# Patient Record
Sex: Female | Born: 1940 | Race: Black or African American | Hispanic: No | Smoking: Former smoker
Health system: Southern US, Community
[De-identification: ages and names within clinical notes are randomized; demographics above are authoritative.]

## PROBLEM LIST (undated history)

## (undated) ENCOUNTER — Ambulatory Visit: Discharge status: Absent without leave | Payer: Medicare HMO

## (undated) DIAGNOSIS — E876 Hypokalemia: Secondary | ICD-10-CM

## (undated) DIAGNOSIS — E119 Type 2 diabetes mellitus without complications: Secondary | ICD-10-CM

## (undated) DIAGNOSIS — N183 Chronic kidney disease, stage 3 unspecified: Secondary | ICD-10-CM

## (undated) DIAGNOSIS — I10 Essential (primary) hypertension: Secondary | ICD-10-CM

## (undated) DIAGNOSIS — E785 Hyperlipidemia, unspecified: Secondary | ICD-10-CM

## (undated) DIAGNOSIS — I5022 Chronic systolic (congestive) heart failure: Secondary | ICD-10-CM

## (undated) DIAGNOSIS — F0390 Unspecified dementia without behavioral disturbance: Secondary | ICD-10-CM

## (undated) DIAGNOSIS — I119 Hypertensive heart disease without heart failure: Secondary | ICD-10-CM

## (undated) DIAGNOSIS — I43 Cardiomyopathy in diseases classified elsewhere: Secondary | ICD-10-CM

## (undated) HISTORY — DX: Essential (primary) hypertension: I10

## (undated) HISTORY — DX: Hypertensive heart disease without heart failure: I11.9

## (undated) HISTORY — PX: CHOLECYSTECTOMY: SHX55

## (undated) HISTORY — PX: TONSILLECTOMY: SUR1361

## (undated) HISTORY — DX: Chronic kidney disease, stage 3 unspecified: N18.30

## (undated) HISTORY — DX: Chronic kidney disease, stage 3 (moderate): N18.3

## (undated) HISTORY — DX: Hypertensive heart disease without heart failure: I43

## (undated) HISTORY — DX: Hyperlipidemia, unspecified: E78.5

## (undated) HISTORY — DX: Type 2 diabetes mellitus without complications: E11.9

## (undated) HISTORY — PX: ABDOMINAL HYSTERECTOMY: SHX81

## (undated) HISTORY — DX: Chronic systolic (congestive) heart failure: I50.22

## (undated) HISTORY — DX: Hypokalemia: E87.6

---

## 2004-11-07 ENCOUNTER — Emergency Department: Payer: Self-pay | Admitting: Emergency Medicine

## 2005-01-18 ENCOUNTER — Other Ambulatory Visit: Payer: Self-pay

## 2005-01-18 ENCOUNTER — Emergency Department: Payer: Self-pay | Admitting: Emergency Medicine

## 2005-03-21 ENCOUNTER — Ambulatory Visit: Payer: Self-pay | Admitting: Family Medicine

## 2005-04-22 ENCOUNTER — Ambulatory Visit: Payer: Self-pay | Admitting: Gastroenterology

## 2005-07-02 ENCOUNTER — Ambulatory Visit: Payer: Self-pay | Admitting: Gastroenterology

## 2006-02-04 ENCOUNTER — Ambulatory Visit: Payer: Self-pay | Admitting: Family Medicine

## 2007-01-19 ENCOUNTER — Ambulatory Visit: Payer: Self-pay | Admitting: Family Medicine

## 2007-06-16 ENCOUNTER — Ambulatory Visit: Payer: Self-pay | Admitting: Family Medicine

## 2008-02-11 ENCOUNTER — Ambulatory Visit: Payer: Self-pay | Admitting: Family Medicine

## 2008-07-29 LAB — HM COLONOSCOPY: HM Colonoscopy: NORMAL

## 2008-11-04 ENCOUNTER — Ambulatory Visit: Payer: Self-pay | Admitting: Family Medicine

## 2008-11-18 ENCOUNTER — Ambulatory Visit: Payer: Self-pay | Admitting: Gastroenterology

## 2009-02-23 ENCOUNTER — Ambulatory Visit: Payer: Self-pay | Admitting: Family Medicine

## 2009-07-17 ENCOUNTER — Inpatient Hospital Stay: Payer: Self-pay | Admitting: Internal Medicine

## 2010-01-10 ENCOUNTER — Ambulatory Visit: Payer: Self-pay | Admitting: Family Medicine

## 2010-01-30 ENCOUNTER — Ambulatory Visit: Payer: Self-pay | Admitting: General Surgery

## 2010-02-06 ENCOUNTER — Ambulatory Visit: Payer: Self-pay | Admitting: General Surgery

## 2010-02-08 ENCOUNTER — Ambulatory Visit: Payer: Self-pay | Admitting: General Surgery

## 2010-02-09 LAB — PATHOLOGY REPORT

## 2010-06-25 ENCOUNTER — Ambulatory Visit: Payer: Self-pay | Admitting: Family Medicine

## 2010-07-27 ENCOUNTER — Inpatient Hospital Stay: Payer: Self-pay | Admitting: Internal Medicine

## 2010-09-17 ENCOUNTER — Ambulatory Visit: Payer: Self-pay | Admitting: Gastroenterology

## 2010-11-02 ENCOUNTER — Emergency Department: Payer: Self-pay | Admitting: Emergency Medicine

## 2011-03-28 DIAGNOSIS — E118 Type 2 diabetes mellitus with unspecified complications: Secondary | ICD-10-CM | POA: Insufficient documentation

## 2011-03-28 DIAGNOSIS — Z8719 Personal history of other diseases of the digestive system: Secondary | ICD-10-CM | POA: Insufficient documentation

## 2011-03-28 DIAGNOSIS — E119 Type 2 diabetes mellitus without complications: Secondary | ICD-10-CM | POA: Insufficient documentation

## 2012-05-22 ENCOUNTER — Ambulatory Visit: Payer: Self-pay | Admitting: Internal Medicine

## 2012-06-02 ENCOUNTER — Emergency Department: Payer: Self-pay | Admitting: Emergency Medicine

## 2012-06-02 LAB — CBC
HGB: 13.1 g/dL (ref 12.0–16.0)
MCH: 29.5 pg (ref 26.0–34.0)
MCV: 87 fL (ref 80–100)
RBC: 4.43 10*6/uL (ref 3.80–5.20)
RDW: 13.8 % (ref 11.5–14.5)

## 2012-06-02 LAB — URINALYSIS, COMPLETE
Bilirubin,UR: NEGATIVE
Ketone: NEGATIVE
Ph: 6 (ref 4.5–8.0)
Protein: 100
RBC,UR: 2 /HPF (ref 0–5)
Specific Gravity: 1.004 (ref 1.003–1.030)
Squamous Epithelial: 3
WBC UR: 2 /HPF (ref 0–5)

## 2012-06-02 LAB — COMPREHENSIVE METABOLIC PANEL
Albumin: 3.5 g/dL (ref 3.4–5.0)
Alkaline Phosphatase: 97 U/L (ref 50–136)
Anion Gap: 7 (ref 7–16)
BUN: 17 mg/dL (ref 7–18)
Bilirubin,Total: 0.4 mg/dL (ref 0.2–1.0)
Calcium, Total: 9 mg/dL (ref 8.5–10.1)
Creatinine: 1.03 mg/dL (ref 0.60–1.30)
Glucose: 64 mg/dL — ABNORMAL LOW (ref 65–99)
Potassium: 2.7 mmol/L — ABNORMAL LOW (ref 3.5–5.1)
SGOT(AST): 29 U/L (ref 15–37)
SGPT (ALT): 22 U/L (ref 12–78)
Total Protein: 7.7 g/dL (ref 6.4–8.2)

## 2012-06-02 LAB — CK TOTAL AND CKMB (NOT AT ARMC): CK-MB: 0.7 ng/mL (ref 0.5–3.6)

## 2012-06-02 LAB — MAGNESIUM: Magnesium: 1.6 mg/dL — ABNORMAL LOW

## 2012-12-05 ENCOUNTER — Emergency Department: Payer: Self-pay | Admitting: Emergency Medicine

## 2012-12-07 ENCOUNTER — Emergency Department: Payer: Self-pay | Admitting: Emergency Medicine

## 2013-08-17 ENCOUNTER — Ambulatory Visit: Payer: Self-pay | Admitting: Internal Medicine

## 2013-08-17 LAB — HM MAMMOGRAPHY: HM MAMMO: NORMAL

## 2014-08-17 DIAGNOSIS — E1165 Type 2 diabetes mellitus with hyperglycemia: Secondary | ICD-10-CM | POA: Diagnosis not present

## 2014-08-17 DIAGNOSIS — Z09 Encounter for follow-up examination after completed treatment for conditions other than malignant neoplasm: Secondary | ICD-10-CM | POA: Diagnosis not present

## 2014-08-17 DIAGNOSIS — Z23 Encounter for immunization: Secondary | ICD-10-CM | POA: Diagnosis not present

## 2014-08-17 DIAGNOSIS — N182 Chronic kidney disease, stage 2 (mild): Secondary | ICD-10-CM | POA: Diagnosis not present

## 2014-08-17 DIAGNOSIS — K274 Chronic or unspecified peptic ulcer, site unspecified, with hemorrhage: Secondary | ICD-10-CM | POA: Diagnosis not present

## 2014-08-17 DIAGNOSIS — E1129 Type 2 diabetes mellitus with other diabetic kidney complication: Secondary | ICD-10-CM | POA: Diagnosis not present

## 2014-08-17 DIAGNOSIS — E784 Other hyperlipidemia: Secondary | ICD-10-CM | POA: Diagnosis not present

## 2014-08-17 DIAGNOSIS — I10 Essential (primary) hypertension: Secondary | ICD-10-CM | POA: Diagnosis not present

## 2014-08-17 LAB — HEMOGLOBIN A1C: HEMOGLOBIN A1C: 6.5 % — AB (ref 4.0–6.0)

## 2014-10-24 DIAGNOSIS — R05 Cough: Secondary | ICD-10-CM | POA: Diagnosis not present

## 2014-10-24 DIAGNOSIS — R0602 Shortness of breath: Secondary | ICD-10-CM | POA: Diagnosis not present

## 2014-10-24 DIAGNOSIS — J9811 Atelectasis: Secondary | ICD-10-CM | POA: Diagnosis not present

## 2014-10-24 DIAGNOSIS — R062 Wheezing: Secondary | ICD-10-CM | POA: Diagnosis not present

## 2014-10-24 LAB — COMPREHENSIVE METABOLIC PANEL
AST: 36 U/L
Albumin: 3.3 g/dL — ABNORMAL LOW
Alkaline Phosphatase: 76 U/L
Anion Gap: 8 (ref 7–16)
BUN: 19 mg/dL
Bilirubin,Total: 0.6 mg/dL
CALCIUM: 8.5 mg/dL — AB
Chloride: 106 mmol/L
Co2: 25 mmol/L
Creatinine: 1.32 mg/dL — ABNORMAL HIGH
EGFR (African American): 47 — ABNORMAL LOW
EGFR (Non-African Amer.): 40 — ABNORMAL LOW
GLUCOSE: 238 mg/dL — AB
Potassium: 3.6 mmol/L
SGPT (ALT): 17 U/L
SODIUM: 139 mmol/L
Total Protein: 6.6 g/dL

## 2014-10-24 LAB — CBC WITH DIFFERENTIAL/PLATELET
Basophil #: 0 10*3/uL (ref 0.0–0.1)
Basophil %: 0.4 %
EOS PCT: 2.3 %
Eosinophil #: 0.1 10*3/uL (ref 0.0–0.7)
HCT: 39.9 % (ref 35.0–47.0)
HGB: 12.7 g/dL (ref 12.0–16.0)
LYMPHS PCT: 38.7 %
Lymphocyte #: 2.3 10*3/uL (ref 1.0–3.6)
MCH: 29.3 pg (ref 26.0–34.0)
MCHC: 31.8 g/dL — ABNORMAL LOW (ref 32.0–36.0)
MCV: 92 fL (ref 80–100)
Monocyte #: 0.4 x10 3/mm (ref 0.2–0.9)
Monocyte %: 7.3 %
NEUTROS ABS: 3 10*3/uL (ref 1.4–6.5)
NEUTROS PCT: 51.3 %
Platelet: 180 10*3/uL (ref 150–440)
RBC: 4.33 10*6/uL (ref 3.80–5.20)
RDW: 14.1 % (ref 11.5–14.5)
WBC: 5.9 10*3/uL (ref 3.6–11.0)

## 2014-10-24 LAB — CK TOTAL AND CKMB (NOT AT ARMC)
CK, TOTAL: 205 U/L
CK-MB: 2.8 ng/mL

## 2014-10-24 LAB — PROTIME-INR
INR: 1
Prothrombin Time: 13.1 secs

## 2014-10-24 LAB — TROPONIN I: Troponin-I: 0.04 ng/mL — ABNORMAL HIGH

## 2014-10-24 LAB — PRO B NATRIURETIC PEPTIDE: B-Type Natriuretic Peptide: 546 pg/mL — ABNORMAL HIGH

## 2014-10-25 ENCOUNTER — Observation Stay: Payer: Self-pay | Admitting: Internal Medicine

## 2014-10-25 ENCOUNTER — Other Ambulatory Visit: Payer: Self-pay | Admitting: Physician Assistant

## 2014-10-25 DIAGNOSIS — E119 Type 2 diabetes mellitus without complications: Secondary | ICD-10-CM | POA: Diagnosis not present

## 2014-10-25 DIAGNOSIS — R7989 Other specified abnormal findings of blood chemistry: Secondary | ICD-10-CM | POA: Diagnosis not present

## 2014-10-25 DIAGNOSIS — N179 Acute kidney failure, unspecified: Secondary | ICD-10-CM | POA: Diagnosis not present

## 2014-10-25 DIAGNOSIS — R0602 Shortness of breath: Secondary | ICD-10-CM | POA: Diagnosis not present

## 2014-10-25 DIAGNOSIS — I10 Essential (primary) hypertension: Secondary | ICD-10-CM | POA: Diagnosis not present

## 2014-10-25 DIAGNOSIS — I2510 Atherosclerotic heart disease of native coronary artery without angina pectoris: Secondary | ICD-10-CM | POA: Diagnosis not present

## 2014-10-25 LAB — URINALYSIS, COMPLETE
BACTERIA: NONE SEEN
Bilirubin,UR: NEGATIVE
Ketone: NEGATIVE
Leukocyte Esterase: NEGATIVE
NITRITE: NEGATIVE
PH: 5 (ref 4.5–8.0)
Protein: >=500
RBC,UR: 7 /HPF (ref 0–5)
SPECIFIC GRAVITY: 1.01 (ref 1.003–1.030)
Squamous Epithelial: 5
WBC UR: 2 /HPF (ref 0–5)

## 2014-10-25 LAB — CREATININE, SERUM
Creatinine: 1.34 mg/dL — ABNORMAL HIGH
GFR CALC AF AMER: 46 — AB
GFR CALC NON AF AMER: 40 — AB

## 2014-10-25 LAB — TROPONIN I
TROPONIN-I: 0.51 ng/mL — AB
Troponin-I: 0.46 ng/mL — ABNORMAL HIGH
Troponin-I: 0.51 ng/mL — ABNORMAL HIGH

## 2014-10-25 LAB — CK-MB
CK-MB: 5.6 ng/mL — ABNORMAL HIGH
CK-MB: 5.9 ng/mL — ABNORMAL HIGH
CK-MB: 5.8 ng/mL — ABNORMAL HIGH

## 2014-10-26 ENCOUNTER — Telehealth: Payer: Self-pay

## 2014-10-26 NOTE — Telephone Encounter (Signed)
Patient contacted regarding discharge from [facility] on 10/25/14.  Patient understands to follow up with Dr. Rockey Situ on 11/11/14 at 3:00 at [facility]. Patient understands discharge instructions? yes Patient understands medications and regiment? yes Patient understands to bring all medications to this visit? yes

## 2014-10-27 ENCOUNTER — Inpatient Hospital Stay
Admit: 2014-10-27 | Disposition: A | Discharge status: Home / Self Care | Payer: Self-pay | Attending: Internal Medicine | Admitting: Internal Medicine

## 2014-10-27 DIAGNOSIS — F0280 Dementia in other diseases classified elsewhere without behavioral disturbance: Secondary | ICD-10-CM | POA: Diagnosis not present

## 2014-10-27 DIAGNOSIS — E785 Hyperlipidemia, unspecified: Secondary | ICD-10-CM | POA: Diagnosis not present

## 2014-10-27 DIAGNOSIS — R7989 Other specified abnormal findings of blood chemistry: Secondary | ICD-10-CM | POA: Diagnosis not present

## 2014-10-27 DIAGNOSIS — I214 Non-ST elevation (NSTEMI) myocardial infarction: Secondary | ICD-10-CM | POA: Diagnosis not present

## 2014-10-27 DIAGNOSIS — I674 Hypertensive encephalopathy: Secondary | ICD-10-CM | POA: Diagnosis not present

## 2014-10-27 DIAGNOSIS — I517 Cardiomegaly: Secondary | ICD-10-CM | POA: Diagnosis not present

## 2014-10-27 DIAGNOSIS — E119 Type 2 diabetes mellitus without complications: Secondary | ICD-10-CM | POA: Diagnosis not present

## 2014-10-27 DIAGNOSIS — I10 Essential (primary) hypertension: Secondary | ICD-10-CM | POA: Diagnosis not present

## 2014-10-27 DIAGNOSIS — I119 Hypertensive heart disease without heart failure: Secondary | ICD-10-CM | POA: Diagnosis not present

## 2014-10-27 DIAGNOSIS — G309 Alzheimer's disease, unspecified: Secondary | ICD-10-CM | POA: Diagnosis not present

## 2014-10-27 DIAGNOSIS — R51 Headache: Secondary | ICD-10-CM | POA: Diagnosis not present

## 2014-10-27 DIAGNOSIS — I248 Other forms of acute ischemic heart disease: Secondary | ICD-10-CM | POA: Diagnosis not present

## 2014-10-27 DIAGNOSIS — R030 Elevated blood-pressure reading, without diagnosis of hypertension: Secondary | ICD-10-CM | POA: Diagnosis not present

## 2014-10-27 DIAGNOSIS — R079 Chest pain, unspecified: Secondary | ICD-10-CM | POA: Diagnosis not present

## 2014-10-27 DIAGNOSIS — R0789 Other chest pain: Secondary | ICD-10-CM | POA: Diagnosis not present

## 2014-10-27 DIAGNOSIS — R42 Dizziness and giddiness: Secondary | ICD-10-CM | POA: Diagnosis not present

## 2014-10-27 DIAGNOSIS — E118 Type 2 diabetes mellitus with unspecified complications: Secondary | ICD-10-CM | POA: Diagnosis not present

## 2014-10-27 DIAGNOSIS — I43 Cardiomyopathy in diseases classified elsewhere: Secondary | ICD-10-CM | POA: Diagnosis not present

## 2014-10-27 DIAGNOSIS — I1310 Hypertensive heart and chronic kidney disease without heart failure, with stage 1 through stage 4 chronic kidney disease, or unspecified chronic kidney disease: Secondary | ICD-10-CM | POA: Diagnosis not present

## 2014-10-27 DIAGNOSIS — I350 Nonrheumatic aortic (valve) stenosis: Secondary | ICD-10-CM | POA: Diagnosis not present

## 2014-10-27 DIAGNOSIS — G308 Other Alzheimer's disease: Secondary | ICD-10-CM | POA: Diagnosis not present

## 2014-10-27 DIAGNOSIS — R531 Weakness: Secondary | ICD-10-CM | POA: Diagnosis not present

## 2014-10-27 DIAGNOSIS — N183 Chronic kidney disease, stage 3 (moderate): Secondary | ICD-10-CM | POA: Diagnosis not present

## 2014-10-27 LAB — DRUG SCREEN, URINE
AMPHETAMINES, UR SCREEN: NEGATIVE
BENZODIAZEPINE, UR SCRN: NEGATIVE
Barbiturates, Ur Screen: NEGATIVE
CANNABINOID 50 NG, UR ~~LOC~~: NEGATIVE
Cocaine Metabolite,Ur ~~LOC~~: NEGATIVE
MDMA (Ecstasy)Ur Screen: NEGATIVE
METHADONE, UR SCREEN: NEGATIVE
OPIATE, UR SCREEN: NEGATIVE
PHENCYCLIDINE (PCP) UR S: NEGATIVE
TRICYCLIC, UR SCREEN: NEGATIVE

## 2014-10-27 LAB — COMPREHENSIVE METABOLIC PANEL
ALBUMIN: 3.1 g/dL — AB
ANION GAP: 5 — AB (ref 7–16)
AST: 30 U/L
Alkaline Phosphatase: 69 U/L
BUN: 15 mg/dL
Bilirubin,Total: 0.7 mg/dL
CHLORIDE: 104 mmol/L
CREATININE: 1.22 mg/dL — AB
Calcium, Total: 8.6 mg/dL — ABNORMAL LOW
Co2: 29 mmol/L
EGFR (Non-African Amer.): 45 — ABNORMAL LOW
GFR CALC AF AMER: 52 — AB
Glucose: 117 mg/dL — ABNORMAL HIGH
Potassium: 3.7 mmol/L
SGPT (ALT): 19 U/L
SODIUM: 138 mmol/L
Total Protein: 6.4 g/dL — ABNORMAL LOW

## 2014-10-27 LAB — URINALYSIS, COMPLETE
Bacteria: NONE SEEN
Bilirubin,UR: NEGATIVE
Glucose,UR: NEGATIVE mg/dL (ref 0–75)
Ketone: NEGATIVE
Leukocyte Esterase: NEGATIVE
Nitrite: NEGATIVE
PH: 7 (ref 4.5–8.0)
RBC,UR: <1 /HPF (ref 0–5)
SPECIFIC GRAVITY: 1.008 (ref 1.003–1.030)
Squamous Epithelial: 2

## 2014-10-27 LAB — CK TOTAL AND CKMB (NOT AT ARMC)
CK, TOTAL: 105 U/L
CK-MB: 2 ng/mL

## 2014-10-27 LAB — PROTIME-INR
INR: 1
PROTHROMBIN TIME: 13.2 s

## 2014-10-27 LAB — TROPONIN I
TROPONIN-I: 0.14 ng/mL — AB
Troponin-I: 0.18 ng/mL — ABNORMAL HIGH

## 2014-10-27 LAB — APTT: ACTIVATED PTT: 27.7 s (ref 23.6–35.9)

## 2014-10-27 LAB — CBC
HCT: 37.9 % (ref 35.0–47.0)
HGB: 12.2 g/dL (ref 12.0–16.0)
MCH: 29.7 pg (ref 26.0–34.0)
MCHC: 32.3 g/dL (ref 32.0–36.0)
MCV: 92 fL (ref 80–100)
Platelet: 182 10*3/uL (ref 150–440)
RBC: 4.12 10*6/uL (ref 3.80–5.20)
RDW: 14.1 % (ref 11.5–14.5)
WBC: 6.4 10*3/uL (ref 3.6–11.0)

## 2014-10-27 LAB — CK-MB: CK-MB: 1.8 ng/mL

## 2014-10-27 LAB — TSH: Thyroid Stimulating Horm: 5.591 u[IU]/mL — ABNORMAL HIGH

## 2014-10-28 DIAGNOSIS — I350 Nonrheumatic aortic (valve) stenosis: Secondary | ICD-10-CM | POA: Diagnosis not present

## 2014-10-28 DIAGNOSIS — R079 Chest pain, unspecified: Secondary | ICD-10-CM | POA: Diagnosis not present

## 2014-10-28 DIAGNOSIS — R7989 Other specified abnormal findings of blood chemistry: Secondary | ICD-10-CM | POA: Diagnosis not present

## 2014-10-28 LAB — CBC WITH DIFFERENTIAL/PLATELET
Basophil #: 0 10*3/uL (ref 0.0–0.1)
Basophil %: 0.5 %
EOS PCT: 1 %
Eosinophil #: 0.1 10*3/uL (ref 0.0–0.7)
HCT: 35.7 % (ref 35.0–47.0)
HGB: 11.6 g/dL — AB (ref 12.0–16.0)
LYMPHS PCT: 26.2 %
Lymphocyte #: 1.7 10*3/uL (ref 1.0–3.6)
MCH: 29.6 pg (ref 26.0–34.0)
MCHC: 32.4 g/dL (ref 32.0–36.0)
MCV: 91 fL (ref 80–100)
MONOS PCT: 7.1 %
Monocyte #: 0.5 x10 3/mm (ref 0.2–0.9)
NEUTROS PCT: 65.2 %
Neutrophil #: 4.3 10*3/uL (ref 1.4–6.5)
PLATELETS: 163 10*3/uL (ref 150–440)
RBC: 3.92 10*6/uL (ref 3.80–5.20)
RDW: 14.3 % (ref 11.5–14.5)
WBC: 6.7 10*3/uL (ref 3.6–11.0)

## 2014-10-28 LAB — BASIC METABOLIC PANEL
ANION GAP: 6 — AB (ref 7–16)
BUN: 15 mg/dL
Calcium, Total: 8.3 mg/dL — ABNORMAL LOW
Chloride: 106 mmol/L
Co2: 26 mmol/L
Creatinine: 1.12 mg/dL — ABNORMAL HIGH
EGFR (Non-African Amer.): 49 — ABNORMAL LOW
GFR CALC AF AMER: 57 — AB
GLUCOSE: 212 mg/dL — AB
Potassium: 3.7 mmol/L
SODIUM: 138 mmol/L

## 2014-10-28 LAB — HEMOGLOBIN A1C: Hemoglobin A1C: 6.3 % — ABNORMAL HIGH

## 2014-10-28 LAB — LIPID PANEL
Cholesterol: 211 mg/dL — ABNORMAL HIGH
HDL Cholesterol: 46 mg/dL
LDL CHOLESTEROL, CALC: 139 mg/dL — AB
Triglycerides: 128 mg/dL
VLDL CHOLESTEROL, CALC: 26 mg/dL

## 2014-10-28 LAB — TROPONIN I: Troponin-I: 0.13 ng/mL — ABNORMAL HIGH

## 2014-10-28 LAB — CK-MB: CK-MB: 1.8 ng/mL

## 2014-11-02 DIAGNOSIS — N183 Chronic kidney disease, stage 3 (moderate): Secondary | ICD-10-CM | POA: Diagnosis not present

## 2014-11-02 DIAGNOSIS — G309 Alzheimer's disease, unspecified: Secondary | ICD-10-CM | POA: Diagnosis not present

## 2014-11-02 DIAGNOSIS — Z794 Long term (current) use of insulin: Secondary | ICD-10-CM | POA: Diagnosis not present

## 2014-11-02 DIAGNOSIS — F0280 Dementia in other diseases classified elsewhere without behavioral disturbance: Secondary | ICD-10-CM | POA: Diagnosis not present

## 2014-11-02 DIAGNOSIS — Z7982 Long term (current) use of aspirin: Secondary | ICD-10-CM | POA: Diagnosis not present

## 2014-11-02 DIAGNOSIS — G43909 Migraine, unspecified, not intractable, without status migrainosus: Secondary | ICD-10-CM | POA: Diagnosis not present

## 2014-11-02 DIAGNOSIS — E119 Type 2 diabetes mellitus without complications: Secondary | ICD-10-CM | POA: Diagnosis not present

## 2014-11-02 DIAGNOSIS — I129 Hypertensive chronic kidney disease with stage 1 through stage 4 chronic kidney disease, or unspecified chronic kidney disease: Secondary | ICD-10-CM | POA: Diagnosis not present

## 2014-11-02 DIAGNOSIS — I2510 Atherosclerotic heart disease of native coronary artery without angina pectoris: Secondary | ICD-10-CM | POA: Diagnosis not present

## 2014-11-03 DIAGNOSIS — I129 Hypertensive chronic kidney disease with stage 1 through stage 4 chronic kidney disease, or unspecified chronic kidney disease: Secondary | ICD-10-CM | POA: Diagnosis not present

## 2014-11-03 DIAGNOSIS — N183 Chronic kidney disease, stage 3 (moderate): Secondary | ICD-10-CM | POA: Diagnosis not present

## 2014-11-03 DIAGNOSIS — Z794 Long term (current) use of insulin: Secondary | ICD-10-CM | POA: Diagnosis not present

## 2014-11-03 DIAGNOSIS — Z7982 Long term (current) use of aspirin: Secondary | ICD-10-CM | POA: Diagnosis not present

## 2014-11-03 DIAGNOSIS — I2510 Atherosclerotic heart disease of native coronary artery without angina pectoris: Secondary | ICD-10-CM | POA: Diagnosis not present

## 2014-11-03 DIAGNOSIS — F0280 Dementia in other diseases classified elsewhere without behavioral disturbance: Secondary | ICD-10-CM | POA: Diagnosis not present

## 2014-11-03 DIAGNOSIS — G309 Alzheimer's disease, unspecified: Secondary | ICD-10-CM | POA: Diagnosis not present

## 2014-11-03 DIAGNOSIS — G43909 Migraine, unspecified, not intractable, without status migrainosus: Secondary | ICD-10-CM | POA: Diagnosis not present

## 2014-11-03 DIAGNOSIS — E119 Type 2 diabetes mellitus without complications: Secondary | ICD-10-CM | POA: Diagnosis not present

## 2014-11-04 DIAGNOSIS — E119 Type 2 diabetes mellitus without complications: Secondary | ICD-10-CM | POA: Diagnosis not present

## 2014-11-04 DIAGNOSIS — Z7982 Long term (current) use of aspirin: Secondary | ICD-10-CM | POA: Diagnosis not present

## 2014-11-04 DIAGNOSIS — I2510 Atherosclerotic heart disease of native coronary artery without angina pectoris: Secondary | ICD-10-CM | POA: Diagnosis not present

## 2014-11-04 DIAGNOSIS — G43909 Migraine, unspecified, not intractable, without status migrainosus: Secondary | ICD-10-CM | POA: Diagnosis not present

## 2014-11-04 DIAGNOSIS — F0280 Dementia in other diseases classified elsewhere without behavioral disturbance: Secondary | ICD-10-CM | POA: Diagnosis not present

## 2014-11-04 DIAGNOSIS — I129 Hypertensive chronic kidney disease with stage 1 through stage 4 chronic kidney disease, or unspecified chronic kidney disease: Secondary | ICD-10-CM | POA: Diagnosis not present

## 2014-11-04 DIAGNOSIS — G309 Alzheimer's disease, unspecified: Secondary | ICD-10-CM | POA: Diagnosis not present

## 2014-11-04 DIAGNOSIS — Z794 Long term (current) use of insulin: Secondary | ICD-10-CM | POA: Diagnosis not present

## 2014-11-04 DIAGNOSIS — N183 Chronic kidney disease, stage 3 (moderate): Secondary | ICD-10-CM | POA: Diagnosis not present

## 2014-11-08 DIAGNOSIS — G43909 Migraine, unspecified, not intractable, without status migrainosus: Secondary | ICD-10-CM | POA: Diagnosis not present

## 2014-11-08 DIAGNOSIS — I129 Hypertensive chronic kidney disease with stage 1 through stage 4 chronic kidney disease, or unspecified chronic kidney disease: Secondary | ICD-10-CM | POA: Diagnosis not present

## 2014-11-08 DIAGNOSIS — G309 Alzheimer's disease, unspecified: Secondary | ICD-10-CM | POA: Diagnosis not present

## 2014-11-08 DIAGNOSIS — Z794 Long term (current) use of insulin: Secondary | ICD-10-CM | POA: Diagnosis not present

## 2014-11-08 DIAGNOSIS — E119 Type 2 diabetes mellitus without complications: Secondary | ICD-10-CM | POA: Diagnosis not present

## 2014-11-08 DIAGNOSIS — I2510 Atherosclerotic heart disease of native coronary artery without angina pectoris: Secondary | ICD-10-CM | POA: Diagnosis not present

## 2014-11-08 DIAGNOSIS — F0280 Dementia in other diseases classified elsewhere without behavioral disturbance: Secondary | ICD-10-CM | POA: Diagnosis not present

## 2014-11-08 DIAGNOSIS — N183 Chronic kidney disease, stage 3 (moderate): Secondary | ICD-10-CM | POA: Diagnosis not present

## 2014-11-08 DIAGNOSIS — Z7982 Long term (current) use of aspirin: Secondary | ICD-10-CM | POA: Diagnosis not present

## 2014-11-09 ENCOUNTER — Encounter: Payer: Self-pay | Admitting: Cardiovascular Disease

## 2014-11-11 ENCOUNTER — Encounter: Payer: Self-pay | Admitting: Cardiovascular Disease

## 2014-11-11 DIAGNOSIS — Z7982 Long term (current) use of aspirin: Secondary | ICD-10-CM | POA: Diagnosis not present

## 2014-11-11 DIAGNOSIS — F0280 Dementia in other diseases classified elsewhere without behavioral disturbance: Secondary | ICD-10-CM | POA: Diagnosis not present

## 2014-11-11 DIAGNOSIS — Z794 Long term (current) use of insulin: Secondary | ICD-10-CM | POA: Diagnosis not present

## 2014-11-11 DIAGNOSIS — I129 Hypertensive chronic kidney disease with stage 1 through stage 4 chronic kidney disease, or unspecified chronic kidney disease: Secondary | ICD-10-CM | POA: Diagnosis not present

## 2014-11-11 DIAGNOSIS — I2510 Atherosclerotic heart disease of native coronary artery without angina pectoris: Secondary | ICD-10-CM | POA: Diagnosis not present

## 2014-11-11 DIAGNOSIS — E119 Type 2 diabetes mellitus without complications: Secondary | ICD-10-CM | POA: Diagnosis not present

## 2014-11-11 DIAGNOSIS — N183 Chronic kidney disease, stage 3 (moderate): Secondary | ICD-10-CM | POA: Diagnosis not present

## 2014-11-11 DIAGNOSIS — G309 Alzheimer's disease, unspecified: Secondary | ICD-10-CM | POA: Diagnosis not present

## 2014-11-11 DIAGNOSIS — G43909 Migraine, unspecified, not intractable, without status migrainosus: Secondary | ICD-10-CM | POA: Diagnosis not present

## 2014-11-14 DIAGNOSIS — E119 Type 2 diabetes mellitus without complications: Secondary | ICD-10-CM | POA: Diagnosis not present

## 2014-11-14 DIAGNOSIS — I2510 Atherosclerotic heart disease of native coronary artery without angina pectoris: Secondary | ICD-10-CM | POA: Diagnosis not present

## 2014-11-14 DIAGNOSIS — Z794 Long term (current) use of insulin: Secondary | ICD-10-CM | POA: Diagnosis not present

## 2014-11-14 DIAGNOSIS — I129 Hypertensive chronic kidney disease with stage 1 through stage 4 chronic kidney disease, or unspecified chronic kidney disease: Secondary | ICD-10-CM | POA: Diagnosis not present

## 2014-11-14 DIAGNOSIS — G309 Alzheimer's disease, unspecified: Secondary | ICD-10-CM | POA: Diagnosis not present

## 2014-11-14 DIAGNOSIS — N183 Chronic kidney disease, stage 3 (moderate): Secondary | ICD-10-CM | POA: Diagnosis not present

## 2014-11-14 DIAGNOSIS — F0280 Dementia in other diseases classified elsewhere without behavioral disturbance: Secondary | ICD-10-CM | POA: Diagnosis not present

## 2014-11-14 DIAGNOSIS — G43909 Migraine, unspecified, not intractable, without status migrainosus: Secondary | ICD-10-CM | POA: Diagnosis not present

## 2014-11-14 DIAGNOSIS — Z7982 Long term (current) use of aspirin: Secondary | ICD-10-CM | POA: Diagnosis not present

## 2014-11-15 ENCOUNTER — Encounter: Payer: Self-pay | Admitting: Internal Medicine

## 2014-11-15 ENCOUNTER — Other Ambulatory Visit: Payer: Self-pay

## 2014-11-15 DIAGNOSIS — E876 Hypokalemia: Secondary | ICD-10-CM | POA: Insufficient documentation

## 2014-11-15 DIAGNOSIS — E1169 Type 2 diabetes mellitus with other specified complication: Secondary | ICD-10-CM | POA: Insufficient documentation

## 2014-11-15 DIAGNOSIS — I43 Cardiomyopathy in diseases classified elsewhere: Principal | ICD-10-CM

## 2014-11-15 DIAGNOSIS — I10 Essential (primary) hypertension: Secondary | ICD-10-CM | POA: Insufficient documentation

## 2014-11-15 DIAGNOSIS — I119 Hypertensive heart disease without heart failure: Secondary | ICD-10-CM | POA: Insufficient documentation

## 2014-11-15 DIAGNOSIS — J309 Allergic rhinitis, unspecified: Secondary | ICD-10-CM | POA: Insufficient documentation

## 2014-11-15 DIAGNOSIS — M1990 Unspecified osteoarthritis, unspecified site: Secondary | ICD-10-CM | POA: Insufficient documentation

## 2014-11-15 DIAGNOSIS — E1129 Type 2 diabetes mellitus with other diabetic kidney complication: Secondary | ICD-10-CM | POA: Diagnosis not present

## 2014-11-15 DIAGNOSIS — E782 Mixed hyperlipidemia: Secondary | ICD-10-CM

## 2014-11-15 DIAGNOSIS — Z8711 Personal history of peptic ulcer disease: Secondary | ICD-10-CM | POA: Diagnosis not present

## 2014-11-15 DIAGNOSIS — E785 Hyperlipidemia, unspecified: Secondary | ICD-10-CM | POA: Diagnosis not present

## 2014-11-15 DIAGNOSIS — N182 Chronic kidney disease, stage 2 (mild): Secondary | ICD-10-CM | POA: Diagnosis not present

## 2014-11-15 DIAGNOSIS — R413 Other amnesia: Secondary | ICD-10-CM | POA: Diagnosis not present

## 2014-11-15 DIAGNOSIS — E1165 Type 2 diabetes mellitus with hyperglycemia: Secondary | ICD-10-CM | POA: Diagnosis not present

## 2014-11-15 NOTE — Patient Outreach (Signed)
[facility]) Care Management  11/15/2014  SYLVIA MUKHOPADHYAY 12/13/1940 [id number]    RN CM attempted to reach patient to discuss the services of [facility].  Patient was unavailable at the time.  RN CM will try to reach patient again at a later date.  Maury Dus, RN, Ishmael Holter, [place] Telephonic Care Coordinator [phone]

## 2014-11-16 ENCOUNTER — Other Ambulatory Visit: Payer: Self-pay

## 2014-11-16 NOTE — Patient Outreach (Addendum)
[facility]) Care Management  11/16/2014  [name] 10/05/1940 [id number]   RN CM attempted to reach patient to discuss the services of [facility].  A family friend answered the phone and stated "she ain't here".  This was the same gentleman that answered the phone yesterday when outreach was attempted.   RN CM left a call back number yesterday with this gentleman who has identified himself as Earnestine Leys.  Today he stated the same phrase and said he did not know when she would be back at this number.  RN CM again left a call back number for patient.  RN CM will try to find an alternate number to reach patient.  RN CM attempted to reach patient's next of kin granddaughter, Ginny Forth.  Unable to reach her and HIPPA compliant voice mail message left with return call back number.  Granddaughter number is [phone].  Maury Dus, RN, Ishmael Holter, [place] Telephonic Care Coordinator [phone]

## 2014-11-17 ENCOUNTER — Encounter: Payer: Self-pay | Admitting: Cardiovascular Disease

## 2014-11-18 NOTE — Consult Note (Signed)
Brief Consult Note: Diagnosis: Right thumb swelling likely felon.   Patient was seen by consultant.   Comments: I examined the patient in the ED after digital block and I&D by ER staff.  Patient states that she feels better.  She states she awoke with thumb swelling, but it sounds as if she may have had a paronychia that may have spread into the volar thumb.  She has a vertical incision over the volar aspect of the right thumb.  No active drainage.  Patient can flex and extend IP joint.  Sensation decreased due to block.  Thumb well perfused.  Patient will be discharged on antibiotics and will follow up in the office on Monday/Tues or return to the ER if her swelling or thumb pain worsens.  Electronic Signatures: [place] (MD)  (Signed 10-May-14 09:41)  Authored: Brief Consult Note   Last Updated: 10-May-14 09:41 by [place] Park (MD)

## 2014-11-19 DIAGNOSIS — N183 Chronic kidney disease, stage 3 (moderate): Secondary | ICD-10-CM | POA: Diagnosis not present

## 2014-11-19 DIAGNOSIS — F0280 Dementia in other diseases classified elsewhere without behavioral disturbance: Secondary | ICD-10-CM | POA: Diagnosis not present

## 2014-11-19 DIAGNOSIS — Z7982 Long term (current) use of aspirin: Secondary | ICD-10-CM | POA: Diagnosis not present

## 2014-11-19 DIAGNOSIS — G43909 Migraine, unspecified, not intractable, without status migrainosus: Secondary | ICD-10-CM | POA: Diagnosis not present

## 2014-11-19 DIAGNOSIS — G309 Alzheimer's disease, unspecified: Secondary | ICD-10-CM | POA: Diagnosis not present

## 2014-11-19 DIAGNOSIS — I2510 Atherosclerotic heart disease of native coronary artery without angina pectoris: Secondary | ICD-10-CM | POA: Diagnosis not present

## 2014-11-19 DIAGNOSIS — Z794 Long term (current) use of insulin: Secondary | ICD-10-CM | POA: Diagnosis not present

## 2014-11-19 DIAGNOSIS — I129 Hypertensive chronic kidney disease with stage 1 through stage 4 chronic kidney disease, or unspecified chronic kidney disease: Secondary | ICD-10-CM | POA: Diagnosis not present

## 2014-11-19 DIAGNOSIS — E119 Type 2 diabetes mellitus without complications: Secondary | ICD-10-CM | POA: Diagnosis not present

## 2014-11-21 DIAGNOSIS — G309 Alzheimer's disease, unspecified: Secondary | ICD-10-CM | POA: Diagnosis not present

## 2014-11-21 DIAGNOSIS — G43909 Migraine, unspecified, not intractable, without status migrainosus: Secondary | ICD-10-CM | POA: Diagnosis not present

## 2014-11-21 DIAGNOSIS — F0280 Dementia in other diseases classified elsewhere without behavioral disturbance: Secondary | ICD-10-CM | POA: Diagnosis not present

## 2014-11-21 DIAGNOSIS — Z794 Long term (current) use of insulin: Secondary | ICD-10-CM | POA: Diagnosis not present

## 2014-11-21 DIAGNOSIS — I129 Hypertensive chronic kidney disease with stage 1 through stage 4 chronic kidney disease, or unspecified chronic kidney disease: Secondary | ICD-10-CM | POA: Diagnosis not present

## 2014-11-21 DIAGNOSIS — I2510 Atherosclerotic heart disease of native coronary artery without angina pectoris: Secondary | ICD-10-CM | POA: Diagnosis not present

## 2014-11-21 DIAGNOSIS — E119 Type 2 diabetes mellitus without complications: Secondary | ICD-10-CM | POA: Diagnosis not present

## 2014-11-21 DIAGNOSIS — Z7982 Long term (current) use of aspirin: Secondary | ICD-10-CM | POA: Diagnosis not present

## 2014-11-21 DIAGNOSIS — N183 Chronic kidney disease, stage 3 (moderate): Secondary | ICD-10-CM | POA: Diagnosis not present

## 2014-11-22 ENCOUNTER — Other Ambulatory Visit: Payer: Self-pay

## 2014-11-22 NOTE — Patient Outreach (Signed)
[facility]) Care Management  11/22/2014  KHYLAH ISEMAN 12-04-1940 [id number]   RN CM has made 3 outreach attempt to this patient.  Unable to reach patient and unable to leave a voice mail message.  RN CM will send a letter to patient's home about the services of [facility].  If no response from patient within the next 10 days then this case will be closed and primary doctor notified.  Maury Dus, RN, Ishmael Holter, [place] Telephonic Care Coordinator [phone]

## 2014-11-27 NOTE — Discharge Summary (Signed)
PATIENT NAME:  NINO, KIRSCHNER MR#:  [id number] DATE OF BIRTH:  [birth year]  DATE OF ADMISSION:  10/27/2014 DATE OF DISCHARGE:  10/30/2014  PRIMARY CARE PHYSICIAN: Halina Maidens, M.D.   FINAL DIAGNOSES: 1.  Accelerated hypertension.  2.  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  3.  Chronic kidney disease, stage III.  4.  Diabetes.  5.  Elevated troponin.   DISCHARGE MEDICATIONS: Include hydrochlorothiazide 25 mg daily, simvastatin 40 mg at bedtime, metoprolol succinate 50 mg once a day, potassium chloride 20 mEq daily, omeprazole 40 mg twice a day, Levemir 25 units subcutaneous injection daily, lisinopril 20 mg daily, aspirin 81 mg daily, Imdur 30 mg daily, amlodipine 10 mg at bedtime, Seroquel 25 mg at bedtime.  HOME HEALTH: Yes; physical therapy, nurse and nurse aide. Help with medications and skilled assessment.  DISCHARGE DIET: Low sodium, carbohydrate-controlled diet, regular consistency.   DISCHARGE ACTIVITY: As tolerated.  DISCHARGE INSTRUCTIONS: Follow up in 1 week with Dr. Halina Maidens.   HISTORY: The patient was admitted October 27, 2014 and discharged October 30, 2014. Came in with hypertensive urgency, persistently elevated troponin. Brought in with chest pain, elevated troponin. The patient had altered mental status and some chronic kidney disease.  DIAGNOSTIC DATA DURING HOSPITAL COURSE: Included a glucose of 123.   EKG: Normal sinus rhythm, LVH.   TSH slightly elevated at 5.591. Hemoglobin A1c 6.3. Glucose 117, BUN 15, creatinine 1.22, sodium 138, potassium 3.7, chloride 104, CO2 29, calcium 8.6, total bilirubin 0.7, albumin 3.1. White blood cell count 6.4, hemoglobin and hematocrit 12.2 and 37.9, platelet count 182,000. CPK normal range. Next troponin borderline at 0.04.   CT scan of the head without contrast: Nonspecific chronic small vessel disease.   Chest ray: Cardiomegaly.  Urine toxicology negative. Urinalysis 1+ blood. Next troponin borderline at  0.18. Next creatinine 1.12. LDL 139, HDL 46, triglycerides 128.   Echocardiogram showed EF 35% to 40%, mildly decreased left ventricular systolic function, severe LVH, moderate mitral valve regurgitation, moderate aortic regurgitation.  Vitamin B12 is pending. Dr. Army Melia can follow up on this. RPR nonreactive.   HOSPITAL COURSE PER PROBLEM LIST:  1.  For the patient's accelerated hypertension, she was put back on her usual medication, Norvasc, which was on her list on the last discharge. It was not on her list on this admission; it was restarted. I actually gave some doses of clonidine and dropped her blood pressure down a little too low; clonidine was stopped. Blood pressure upon discharge normal range, 120/80. Follow up as outpatient needed.  2.  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 I gave some Seroquel at night to sleep and stay asleep. I mentioned this to the family that she needs 24 hour supervision. She has been living alone previously. Family has been helping out with medications, but I think noncompliance was the issue with the accelerated hypertension. Family is going to take them to live with her or be with her. I did set up home health also for skilled assessment evaluation.  3.  Chronic kidney disease, stage III, likely with the accelerated hypertension.  4.  Diabetes, under good control. Sugars have been under good control here.  5.  Elevated troponin, likely secondary to accelerated hypertension.  6.  Hyperlipidemia. On simvastatin.  7.  Gastroesophageal reflux disease. On omeprazole.  8.  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 Follow up as outpatient with cardiology team, [facility], Dr. Rockey Situ.  TIME SPENT ON DISCHARGE: 35 minutes.   ____________________________ Delfino Lovett  Adella Hare, MD rjw:sb D: 10/30/2014 15:58:10 ET T: 10/31/2014 08:31:33 ET JOB#: [id number]  cc: Tana Conch. Leslye Peer, MD, <Dictator> Halina Maidens, MD Marisue Brooklyn MD ELECTRONICALLY SIGNED 11/03/2014 15:41

## 2014-11-27 NOTE — H&P (Signed)
PATIENT NAME:  Alyssa [name], Alyssa [name] MR#:  [id number] DATE OF BIRTH:  April 17, 1941  DATE OF ADMISSION:  10/25/2014  REFERRING PHYSICIAN: Valli Glance. Owens Shark, MD  PRIMARY CARE PHYSICIAN: Halina Maidens, MD    ADMISSION DIAGNOSIS: Hypertensive urgency.   HISTORY OF PRESENT ILLNESS: This is a 74 year old African American female who presents to the Emergency Department complaining of shortness of breath. The patient states that her shortness of breath progressively worsened over the course of the evening. She denies any chest pain, but admits to nausea. She has not had any episodes of emesis. She had an incident like this last week when she could not catch her breath, but she does not know what her blood pressure was like at that time. She laid down to rest and felt better after a nap. In the Emergency Department, the patient's systolic blood pressure was greater than 220, which prompted the Emergency Department to call for admission.   REVIEW OF SYSTEMS:  CONSTITUTIONAL: The patient denies fever or weakness.  EYES: Denies blurred vision or inflammation.  EARS, NOSE, THROAT: Denies tinnitus or sore throat.  RESPIRATORY: Admits to shortness of breath, but denies cough.  CARDIOVASCULAR: Denies chest pain, palpitations, orthopnea, paroxysmal nocturnal dyspnea.  GASTROINTESTINAL: Admits to nausea, but denies vomiting, diarrhea, or abdominal pain.  GENITOURINARY: Denies dysuria, increased frequency, or hesitancy of urination.  ENDOCRINE: Denies polyuria or polydipsia.  HEMATOLOGIC AND LYMPHATIC: Denies easy bruising or bleeding.  MUSCULOSKELETAL: Denies myalgias or arthralgias.  NEUROLOGIC: Denies numbness in her extremities or dysarthria.  SKIN: Denies rashes or lesions.  PSYCHIATRIC: Denies suicidal ideation or depression.   PAST MEDICAL HISTORY: Coronary artery disease, hypertension, diabetes type 2, hyperlipidemia, and migraine headaches.   PAST SURGICAL HISTORY: The patient has had 2 coronary stent  placements, hysterectomy,  cholecystectomy, tonsillectomy with adenoidectomy, and appendectomy.   SOCIAL HISTORY: The patient lives alone. She chews tobacco, but does not smoke, drink, or do any drugs.   FAMILY HISTORY: She does not have any recurrent medical illnesses in her family to her knowledge.   MEDICATIONS:  1.  Amlodipine 10 mg 1 tablet p.o. daily.  2.  Hydrochlorothiazide 25 mg 1 tablet p.o. daily.  3.  Levemir 25 units subcutaneously at bedtime.  4.  Metoprolol succinate 50 mg extended release 1 tablet p.o. daily.  5.  Metoprolol 40 mg extended release 1 capsule p.o. b.i.d.  6.  Potassium chloride 20 mEq extended release 1 tablet p.o. daily.  7.  Simvastatin 40 mg 1 tablet p.o. at bedtime.   ALLERGIES: No known drug allergies.   PERTINENT LABORATORY RESULTS AND RADIOGRAPHIC FINDINGS: Serum glucose is 238, BNP is 546, BUN is 19, creatinine 1.32, serum sodium 139, potassium 3.6, chloride is 106, bicarbonate is 25, calcium is 8.5, serum albumin is 3.3, alkaline phosphatase 76, AST is 36, ALT is 17. Troponin is 0.04. White blood cell count is 5.9, hemoglobin 12.7, hematocrit 39.9, platelet count 180,000, MCV is 92. Urinalysis shows more than 500 mg of protein/dL, as well as 2+ blood, and 2 white blood cells per high powered field. The patient denies symptomatology of urinary tract infection. Chest x-ray shows cardiomegaly with mild vascular congestion and small pleural effusions.   PHYSICAL EXAMINATION:  VITAL SIGNS: Temperature is 98.2, pulse is 78, respirations 18, blood pressure is 202/91, pulse oximetry is 96% on room air.  GENERAL: The patient is alert and oriented x 3, in no apparent distress.  HEENT: Normocephalic, atraumatic. Pupils equal, round, and reactive to light and accommodation. Extraocular  movements are intact. Mucous membranes are moist.  NECK: Trachea is midline. No adenopathy. Thyroid nonpalpable, nontender.  CHEST: Symmetric and atraumatic.  CARDIOVASCULAR:  Regular rate and rhythm. Normal S1, S2. No rubs, clicks, or murmurs appreciated.  LUNGS: Clear to auscultation bilaterally. Normal effort and excursion.  ABDOMEN: Positive bowel sounds. Soft, nontender, nondistended. No hepatosplenomegaly.  GENITOURINARY: Deferred.  MUSCULOSKELETAL: The patient moves all 4 extremities equally. There is 5 out of 5 strength in upper and lower extremities bilaterally.  SKIN: Warm and dry. No rashes or lesions.  EXTREMITIES: No clubbing, cyanosis, or edema.  NEUROLOGIC: Cranial nerves II through XII are grossly intact.  PSYCHIATRIC: Mood is normal. Affect is congruent. The patient has excellent insight and judgment into her medical condition.   ASSESSMENT AND PLAN: This is a 74 year old female admitted for hypertensive urgency.  1.  Hypertensive urgency: Nitroglycerin paste was placed on the patient's chest in the Emergency Department which improved her pressure initially; however, her systolic pressure again became greater than 202, for which we gave 20 mg of intravenous labetalol. I will continue labetalol on an as needed basis. We will restart her daytime medicines and give her morning doses early if needed. I suspect that the patient has some difficulty arranging her medicines or making sure that she takes them all daily. There may also be some financial barriers to compliance which incentivizes her to try to stretch her medicine out, in which case she may not be taking all of her prescribed antihypertensives daily. I will order discharge planning consult to help arrange home health or pharmaceutical teaching.  2.  Acute kidney injury: We will hydrate the patient and manage her hypertension. We will also try to avoid nephrotoxic drugs.  3.  Coronary artery disease: The patient has a history of stent placement. She has no chest pain at this time. Nonetheless, we will track her cardiac enzymes. We will start daily aspirin.  4.  Hypertension. Continue metoprolol,  amlodipine, and hydrochlorothiazide.  5.  Diabetes mellitus type 2: We will continue the patient's basal insulin. I have added sliding scale insulin while she is hospitalized.  6.  Hyperlipidemia: Continue statin therapy.  7.  Deep vein thrombosis prophylaxis: Heparin.  8.  Gastrointestinal prophylaxis: None as the patient is not critically ill.   CODE STATUS: The patient is a FULL CODE.   TIME SPENT ON ADMISSION ORDERS AND PATIENT CARE: Approximately 35 minutes.    ____________________________ Norva Riffle. Marcille Blanco, MD msd:TT D: 10/25/2014 04:01:46 ET T: 10/25/2014 04:28:49 ET JOB#: [id number]  cc: Norva Riffle. Marcille Blanco, MD, <Dictator> Norva Riffle Christyl Osentoski MD ELECTRONICALLY SIGNED 11/16/2014 9:31

## 2014-11-27 NOTE — H&P (Signed)
PATIENT NAME:  Alyssa [name], Alyssa [name] MR#:  [id number] DATE OF BIRTH:  [birth year]  DATE OF ADMISSION:  10/27/2014  REFERRING EMERGENCY ROOM PHYSICIAN:  Dr. Edd Fabian.    PRIMARY CARE PHYSICIAN:  Dr. Halina Maidens.   ADMISSION DIAGNOSIS: Hypertensive urgency and persistently elevated troponins.   HISTORY OF PRESENT ILLNESS: This 74 year old African-American woman presented to the Emergency Room initially with complaint of hypertension and chest pain. She was actually admitted and discharged on March 29 with hypertensive urgency. At that time she had elevated troponins attributed to hypertension and she was discharged once her blood pressures were controlled. The history is provided by her granddaughter as the patient at this point is a poor historian. Granddaughter reports that her grandmother was in her usual state of health at discharge and on the day after discharge. However when the granddaughter came to visit her this morning the home health nurse as well and blood pressures were greater than 200/100. Alyssa [name] was complaining of chest pain and also seemed to be slightly confused with delayed responses to questions and repetitious speech. She has been compliant with her new medications and with her low-sodium diet. She has had occasional shortness of breath. No edema or orthopnea. She has had a headache and some dizziness. No focal numbness or weakness. On presentation to the Emergency Room her blood pressure was elevated at 194/91. Her troponins are elevated at 0.14, but this is actually a decrease from her discharge value from 2 days ago. Hospitalist services are asked to admit for hypertensive urgency and elevated troponins. Dr. Edd Fabian has discussed this case with Dr. Caryl Comes and the initial plan was to admit for inpatient stress testing in the morning, however due to the patient's altered mental status I will hold off on ordering this for now.   PAST MEDICAL HISTORY:  1. Coronary artery disease status post  PCI.  2. Hypertension.  3. Diabetes mellitus type 2 on insulin.  4. Hyperlipidemia.  5. History of migraine headache.   PAST SURGICAL HISTORY:  1. PCI with stent placement x 2.  2. Hysterectomy.  3. Cholecystectomy.  4. Tonsillectomy with adenoidectomy.  5. Appendectomy.   SOCIAL HISTORY: The patient lives alone. She chews tobacco, but does not smoke cigarettes. She does not drink alcohol or use any illicit substances.   FAMILY HISTORY:  She does not have any chronic medical conditions in the family to her knowledge.   REVIEW OF SYSTEMS: The patient is unable to provide a review of systems due to altered mental status.   HOME MEDICATIONS:  1. Simvastatin 40 mg 1 tablet once a day.  2. Potassium chloride 20 mEq 1 tablet once a day.  3. Omeprazole 40 mg 1 tablet twice a day.  4. Metoprolol succinate 50 mg 1 tablet once a day.  5. Lisinopril 20 mg 1 tablet daily.  6. Levemir 100 units per mL 25 units subcutaneously once a day.  7. Hydrochlorothiazide 25 mg 1 tablet once a day.  8. Aspirin 81 mg daily.   ALLERGIES: No known allergies.   PHYSICAL EXAMINATION:  VITAL SIGNS: Temperature 98.2, pulse 71, respirations 23, blood pressure 182/86, oxygenation 98% on room air.  GENERAL: No acute distress.  HEENT: Pupils equal, round, and reactive to light. Conjunctivae are clear. Extraocular motion intact. Oral mucous membranes are pink and moist. Posterior oropharynx is clear with no exudate, edema, or erythema. Trachea is midline. No cervical lymphadenopathy. Thyroid nontender.  RESPIRATORY: Lungs are clear to auscultation bilaterally with good air movement.  No respiratory distress. No rhonchi, wheezes, or rales.  CARDIOVASCULAR: Regular rate and rhythm. No murmurs, rubs, or gallops. No carotid bruit. No JVD. No peripheral edema. Peripheral pulses are 2 +.  ABDOMEN: Soft, nontender, nondistended. Bowel sounds are normal. No guarding, no rebound, no hepatosplenomegaly. No mass.   MUSCULOSKELETAL: No swollen tender joints. Range of motion is normal. Strength is 5 out of 5 throughout.  SKIN: No rashes or lesions. No open wounds.  NEUROLOGIC: Cranial nerves II through XII are grossly intact. Strength and sensation are intact. She has delayed answers to questions and she is not oriented to date. She is not clear on the details of her recent admission.  PSYCHIATRIC: The patient is alert. She is not oriented to date or her current situation. She has psychomotor retardation, possibly depressed versus encephalopathic.   LABORATORY DATA: Sodium 138, potassium 3.7, chloride 104, bicarbonate 29, BUN 15, creatinine 1.2, glucose 117. Total protein 6.4, albumin 3.1, bilirubin 0.7. LFTs normal. CK 105, CK-MB 2.0. Troponin is 0.14 which is down from 0.51 two days ago. White blood cells 6.4, hemoglobin 12.2, platelets 182,000, MCV is 92. INR is 1.1.   IMAGING:  1.  CT scan of the head without contrast shows nonspecific diffuse white matter disease which is stable, likely chronic small vessel disease, no acute intracranial abnormalities.  2.  Chest x-ray shows cardiomegaly with no active disease.   ASSESSMENT AND PLAN:  1.  Persistent chest pain with elevated troponins: Troponins are actually trending downwards from prior admission. We will continue to cycle cardiac enzymes. We will check lipids, hemoglobin A1c.  She will continue with aspirin 81 mg daily, she has received 324 mg in the Emergency Room. We will hold off on heparin drip. We will consult cardiology. EKG does not show any signs of acute ischemia at this time. Initial plan had been to admit for inpatient stress testing tomorrow morning, but due to her altered mental status I do not think she would be able to perform treadmill stress testing and would hold off on nuclear stress testing until her mental status stabilizes. I have ordered a 2-D echocardiogram.  2.  Altered mental status: We will check a UA, UDS, and a TSH. Her CT scan  is negative for acute changes. I think she may have hypertensive encephalopathy. If this does not clear would consider an MRI.  3.  Chronic kidney disease: Her creatinine is decreasing and renal function actually improving since prior admission. She likely has chronic stage III kidney disease due to diabetes and uncontrolled hypertension at home.  4.  Diabetes mellitus: We will check a hemoglobin A1c to assess outpatient control. Continue Levemir and start sliding scale insulin. She will be on a diabetic diet.  5.  Hypertension: Blood pressure has decreased in the Emergency Room to 160/80 without additional medication. I will continue her home regimen of hydrochlorothiazide, lisinopril, and metoprolol, and will provide p.r.n. hydralazine for systolics over [id number]. She will be on a low-sodium diet. It is unclear why she has had such an abrupt increase in her blood pressure despite compliance at home.  6.  Prophylaxis. Heparin for DVT prophylaxis. Protonix for GI prophylaxis.   TIME SPENT ON ADMISSION: 45 minutes.     ____________________________ Earleen Newport. Volanda Napoleon, MD cpw:bu D: 10/27/2014 20:31:11 ET T: 10/27/2014 20:59:00 ET JOB#: [id number]  cc: Barnetta Chapel P. Volanda Napoleon, MD, <Dictator> Aldean Jewett MD ELECTRONICALLY SIGNED 11/05/2014 14:38

## 2014-11-27 NOTE — Discharge Summary (Signed)
PATIENT NAME:  Alyssa [name], Alyssa [name] MR#:  [id number] DATE OF BIRTH:  Dec 27, 1940  DATE OF ADMISSION:  10/25/2014 DATE OF DISCHARGE:  10/25/2014  DISCHARGE DIAGNOSES:  1.  Hypertensive urgency.  2.  Elevated troponin likely due to supply/demand ischemia.   SECONDARY DIAGNOSES:   Coronary artery disease, hypertension, diabetes type 2, hyperlipidemia, and migraine headaches.   CONSULTATIONS:  Cardiology Dr. Ida Rogue.   PROCEDURES AND RADIOLOGY: A chest x-ray on March 28 showed cardiomegaly with mild vascular congestion. Fluid overload/CHF.     MAJOR LABORATORY PANEL: UA on admission was negative.   HISTORY AND SHORT HOSPITAL COURSE: The patient is a 74 year old female with above mentioned medical problems who was admitted for uncontrolled hypertension. Please see Dr. Legrand Como Diamond's dictated history and physical for further details. The patient was also noted to have elevated troponin which was borderline high, thought to be due to supply/demand ischemia. Cardiology consultation was obtained with Dr. Ida Rogue who recommended outpatient followup and evaluation for possible echo and stress test. The patient was in agreement for same. Her blood pressure continued to get better and is being discharged home in stable condition.   VITAL SIGNS: On the date of discharge her vital signs are as follows: Temperature 97.9, heart rate 72 per minute, respirations 18 per minute, blood pressure 168/64. She is saturating 95% on room air.   PERTINENT PHYSICAL EXAMINATION ON THE DATE OF DISCHARGE:   CARDIOVASCULAR: S1, S2 normal. No murmurs, rubs, gallop.  ABDOMEN: Soft, nontender, nondistended. Bowel sounds present. No organomegaly or mass.  EXTREMITIES: No pedal edema, cyanosis, or clubbing.  NEUROLOGIC: Nonfocal examination.   All other physical examination remained at baseline.   DISCHARGE MEDICATIONS:     Medication Instructions  amlodipine 10 mg oral tablet  1 tab(s) orally once a day    hydrochlorothiazide 25 mg oral tablet  1 tab(s) orally once a day   simvastatin 40 mg oral tablet  1 tab(s) orally once a day (at bedtime)   metoprolol succinate 50 mg oral tablet, extended release  1 tab(s) orally once a day   potassium chloride 20 meq oral tablet, extended release  1 tab(s) orally once a day   omeprazole 40 mg oral delayed release capsule  1 cap(s) orally 2 times a day   levemir 100 units/ml subcutaneous solution  25 unit(s) subcutaneous once a day   lisinopril 20 mg oral tablet  1 tab(s) orally once a day   aspirin 81 mg oral delayed release tablet  1 tab(s) orally once a day    DISCHARGE DIET: Low sodium, low fat, low cholesterol, 1800 ADA.    DISCHARGE ACTIVITY: As tolerated.   DISCHARGE INSTRUCTIONS AND FOLLOWUP:  The patient was instructed to follow up with her primary care physician Dr. Halina Maidens in 2-4 weeks. She will need to follow up with Dr. Ida Rogue from [facility]iology in 1-2 weeks.   She remains at very high risk for readmission. The patient continues to demand wanting to go home and is very adamant, so after discussion with cardiology she is being discharged home.   TOTAL TIME DISCHARGING THIS PATIENT: 45  minutes.    ____________________________ Lucina Mellow. Manuella Ghazi, MD vss:bu D: 10/25/2014 15:20:00 ET T: 10/25/2014 15:38:17 ET JOB#: [id number]  cc: Adaline Trejos S. Manuella Ghazi, MD, <Dictator> Halina Maidens, MD Minna Merritts, MD   [place] MD ELECTRONICALLY SIGNED 10/26/2014 14:50

## 2014-11-27 NOTE — Consult Note (Signed)
General Aspect Primary Cardiologist: Dr. Rockey Situ, MD (has only seen x 1 in consult during last admission 10/25/2014) ___________  74 year old female with history of CAD with cath in 2010 that showed minimal CAD, DM2, HLD, and migraines who was recently admitted to [facility] March 29 for hypertensive urgency and demand ischemia returned to [facility] on 3/31 with uncontrolled HTN with BP greater than 200/100 and elevated troponin that appears to be down trending from prior elevation, and chest pain.  __________  PMH: 1. CAD with cath in 2010 that showed 50% stenosis of LCx 2. DM2 3. HLD 4. Migraines 5. Uncontrolled HTN ___________   Present Illness 74 year old female with the above problem list who presented to [facility] on 3/31 with the above CC. She was previously followed by outside cardiology group back in 2010 but has not follow up with them since at least that time. She was recently admitted to [facility] 3/29 for hypertensive urgency blood pressures greater than 200/100 and demand ischemia. Troponin at that time with a peak of 0.51. She was restarted on antihypertensives with improved BP, she refused echo, and she was discharged with outpatient follow up.  She was admitted to [facility] back in 2010 for chest pain and underwent echo at that time time showed EF 55%, no LV thrombus, no wall motion abnormalities, trace AI. Cardiac enzymes were negative at that time. She underwent Myoview stress test that showed borderline abnormal Myoview with apical thinning. No evidence of stress-induced myocardial ischemia and ejection fraction of 60%. Medical management recommended. The patient did not want to go home on the 22nd after the stress test was done, was concerned about her pain and had a cardiac catheterization done by Dr. 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 No intervention was done. It is unclear where the H and P from this admission gets her having a reported history of  CAD with stenting x 2 from as the patient does not know if she has had any stent ever placed and there is no record in [place] or Epic, including [place] of her ever having any stenting done. It was felt she either had costochondritis vs stress induced chest pain. She was disharged on medical Rx.   She had not followed up with a cardiologist since that time. She stretches out her home medications to make them last from a financial standpoint. She does not eat much salt or drink greater than 2L of fluids daily. Her weight has been stable at home. She denies any PND or orthopnea. No edema or abdominal swelling. No early satiety.   Upon her arrival to [facility] she was found to have a BP of great than 960 systolic and onset of chest pain that began while at rest. Chest pain lasted for a couple of minutes then self resolved. She denies any associated SOB, nausea, vomiting, diaphoresis, palpitations, presyncope, or syncope. She was also found to be in an altered state. She was originally scheduled for a nuclear stress test, however this was held given her altered state. No focal numbness or weakness. On presentation to the Emergency Room her blood pressure was elevated at 194/91. Her troponins are elevated at 0.14, but this is a decrease from her discharge value from 2 days ago at 0.51 and likely down trending. She was started on antihypertensives with good response in her blood pressure. Echo was ordered again. EKG unchanged from prior. Head CT negative for acute pathology. Urine  drug screren is negative. She is currently chest pain free.   Physical Exam:  GEN no acute distress, obese   HEENT PERRL, hearing intact to voice, moist oral mucosa   NECK supple  no JVD   RESP normal resp effort  coarse   CARD Regular rate and rhythm  Murmur   Murmur Systolic  2/6   ABD denies tenderness  soft   EXTR negative edema   SKIN normal to palpation   NEURO cranial nerves intact   PSYCH alert, oriented  to place only   Review of Systems:  General: Fatigue  Weakness   Skin: No Complaints   ENT: No Complaints   Eyes: No Complaints   Neck: No Complaints   Respiratory: No Complaints   Cardiovascular: Chest pain or discomfort   Gastrointestinal: No Complaints   Genitourinary: No Complaints   Vascular: No Complaints   Musculoskeletal: No Complaints   Neurologic: No Complaints   Hematologic: No Complaints   Endocrine: No Complaints   Psychiatric: No Complaints   Review of Systems: All other systems were reviewed and found to be negative   Family & Social History:  Family and Social History:  Family History Hypertension   Social History negative ETOH, negative Illicit drugs   + Tobacco Current (within 1 year)  chews tobacco   Place of Living Home     Migraines:    Osteoarthritis:    Diabetes:    CHF:    CAD:    HTN:    Stent - Cardiac:    Hysterectomy:   Home Medications: Medication Instructions Status  lisinopril 20 mg oral tablet 1 tab(s) orally once a day Active  aspirin 81 mg oral delayed release tablet 1 tab(s) orally once a day Active  hydrochlorothiazide 25 mg oral tablet 1 tab(s) orally once a day Active  simvastatin 40 mg oral tablet 1 tab(s) orally once a day (at bedtime) Active  metoprolol succinate 50 mg oral tablet, extended release 1 tab(s) orally once a day Active  potassium chloride 20 mEq oral tablet, extended release 1 tab(s) orally once a day Active  omeprazole 40 mg oral delayed release capsule 1 cap(s) orally 2 times a day Active  Levemir 100 units/mL subcutaneous solution 25 unit(s) subcutaneous once a day Active   Lab Results:  Thyroid:  31-Mar-16 16:23   Thyroid Stimulating Hormone  5.591 (0.350-4.500 NOTE: New Reference Range  10/04/14)  Hepatic:  31-Mar-16 16:23   Total Protein, Serum  6.4 (6.5-8.1 NOTE: New Reference Range  10/04/14)  Albumin, Serum  3.1 (3.5-5.0 NOTE: New reference range  10/04/14)  Routine  Chem:  31-Mar-16 16:23   Creatinine (comp)  1.22 (0.44-1.00 NOTE: New Reference Range  10/04/14)  Potassium, Serum 3.7 (3.5-5.1 NOTE: New Reference Range  10/04/14)  Hemoglobin A1c ([facility])  6.3 (4.0-6.0 NOTE: New Reference Range  10/04/14)  01-Apr-16 01:05   Result Comment - TROPONIN  - PREVIOUSLY CALLED 10-27-14  - '@[id number]'  BY SFW.Marland KitchenMarland KitchenAJO  Result(s) reported on 28 Oct 2014 at 01:50AM.  Cholesterol, Serum  211 (0-200 NOTE: New Reference Range  10/04/14)  Triglycerides, Serum 128 (0-149 NOTE: New Reference Range  10/04/14)  HDL (INHOUSE) 46 (40-1000 NOTE: New Reference Range:  10/04/14)  VLDL Cholesterol Calculated 26 (0-40 NOTE: New Reference Range  10/04/14)  LDL Cholesterol Calculated  139 (0-99 NOTE: New Reference Range:  10/04/14)  Glucose, Serum  212 (65-99 NOTE: New Reference Range  10/04/14)  BUN 15 (6-20 NOTE: New Reference Range  10/04/14)  Creatinine (  comp)  1.12 (0.44-1.00 NOTE: New Reference Range  10/04/14)  Sodium, Serum 138 (135-145 NOTE: New Reference Range  10/04/14)  Potassium, Serum 3.7 (3.5-5.1 NOTE: New Reference Range  10/04/14)  Chloride, Serum 106 (101-111 NOTE: New Reference Range  10/04/14)  CO2, Serum 26 (22-32 NOTE: New Reference Range  10/04/14)  Calcium (Total), Serum  8.3 (8.9-10.3 NOTE: New Reference Range  10/04/14)  Anion Gap  6  eGFR (African American)  57  eGFR (Non-African American)  49 (eGFR values <73m/min/1.73 m2 may be an indication of chronic kidney disease (CKD). Calculated eGFR is useful in patients with stable renal function. The eGFR calculation will not be reliable in acutely ill patients when serum creatinine is changing rapidly. It is not useful in patients on dialysis. The eGFR calculation may not be applicable to patients at the low and high extremes of body sizes, pregnant women, and vegetarians.)  Urine Drugs:  [id number]  Tricyclic Antidepressant, Ur Qual (comp) NEGATIVE (Result(s) reported on 27 Oct 2014 at 09:31PM.)  Amphetamines, Urine Qual. NEGATIVE  MDMA, Urine Qual. NEGATIVE  Cocaine Metabolite, Urine Qual. NEGATIVE  Opiate, Urine qual NEGATIVE  Phencyclidine, Urine Qual. NEGATIVE  Cannabinoid, Urine Qual. NEGATIVE  Barbiturates, Urine Qual. NEGATIVE  Benzodiazepine, Urine Qual. NEGATIVE (------------------ The URINE DRUG SCREEN provides only a preliminary, unconfirmed analytical test result and should not be used for non-medical purposes.  Clinical consideration and professional judgment should be applied to any positive drug screen result due to possible interfering substances.  A more specific alternate chemical method must be used in order to obtain a confirmed analytical result.  Gas chromatography/mass spectrometry (GC/MS) is the preferred confirmatory method.)  Methadone, Urine Qual. NEGATIVE  Cardiac:  31-Mar-16 16:23   Troponin I  0.14 (0.00-0.03 0.03 ng/mL or less: NEGATIVE  Repeat testing in 3-6 hrs  if clinically indicated. >0.05 ng/mL: POTENTIAL  MYOCARDIAL INJURY. Repeat  testing in 3-6 hrs if  clinically indicated. NOTE: An increase or decrease  of 30% or more on serial  testing suggests a  clinically important change NOTE: New Reference Range  10/04/14)    21:40   Troponin I  0.18 (0.00-0.03 0.03 ng/mL or less: NEGATIVE  Repeat testing in 3-6 hrs  if clinically indicated. >0.05 ng/mL: POTENTIAL  MYOCARDIAL INJURY. Repeat  testing in 3-6 hrs if  clinically indicated. NOTE: An increase or decrease  of 30% or more on serial  testing suggests a  clinically important change NOTE: New Reference Range  10/04/14)  01-Apr-16 01:05   Troponin I  0.13 (0.00-0.03 0.03 ng/mL or less: NEGATIVE  Repeat testing in 3-6 hrs  if clinically indicated. >0.05 ng/mL: POTENTIAL  MYOCARDIAL INJURY. Repeat  testing in 3-6 hrs if  clinically indicated. NOTE: An increase or decrease  of 30% or more on serial  testing suggests a  clinically important  change NOTE: New Reference Range  10/04/14)  CPK-MB, Serum 1.8 (0.5-5.0 NOTE: New Reference Range  10/04/14)  Routine UA:  31-Mar-16 20:51   Color (UA) Straw  Clarity (UA) Clear  Glucose (UA) Negative  Bilirubin (UA) Negative  Ketones (UA) Negative  Specific Gravity (UA) 1.008  Blood (UA) 1+  pH (UA) 7.0  Protein (UA) >=500  Nitrite (UA) Negative  Leukocyte Esterase (UA) Negative (Result(s) reported on 27 Oct 2014 at 09:15PM.)  RBC (UA) <1 /HPF  WBC (UA) 1 /HPF  Bacteria (UA) NONE SEEN  Epithelial Cells (UA) 2 /HPF (Result(s) reported on 27 Oct 2014 at 09:15PM.)  Routine  Hem:  01-Apr-16 01:05   WBC (CBC) 6.7  RBC (CBC) 3.92  Hemoglobin (CBC)  11.6  Hematocrit (CBC) 35.7  Platelet Count (CBC) 163  MCV 91  MCH 29.6  MCHC 32.4  RDW 14.3  Neutrophil % 65.2  Lymphocyte % 26.2  Monocyte % 7.1  Eosinophil % 1.0  Basophil % 0.5  Neutrophil # 4.3  Lymphocyte # 1.7  Monocyte # 0.5  Eosinophil # 0.1  Basophil # 0.0 (Result(s) reported on 28 Oct 2014 at 01:30AM.)   EKG:  EKG Interp. by me   Interpretation NSR, 78 bpm, TWI I, II, aVF, V2-V6 (no changes)   Radiology Results: XRay:    31-Mar-16 16:44, Chest Portable Single View  Chest Portable Single View   REASON FOR EXAM:    hypertension, generalized weakness  COMMENTS:       PROCEDURE: DXR - DXR PORTABLE CHEST SINGLE VIEW  - Oct 27 2014  4:44PM     CLINICAL DATA:  Generalized weakness.    EXAM:  PORTABLE CHEST - 1 VIEW    COMPARISON:  10/24/2014    FINDINGS:  Heart is mildly enlarged. Lungs are clear. No effusions or edema. No  acute bony abnormality.   IMPRESSION:  Cardiomegaly.  No active disease.      Electronically Signed    By: Rolm Baptise M.D.    On: 10/27/2014 16:46         Verified By: Raelyn Number, M.D.,  CT:    31-Mar-16 16:37, CT Head Without Contrast  CT Head Without Contrast   REASON FOR EXAM:    generalized weakness  COMMENTS:       PROCEDURE: CT  - CT HEAD WITHOUT CONTRAST   - Oct 27 2014  4:37PM     CLINICAL DATA:  Weakness, headache, feels foggy.    EXAM:  CT HEAD WITHOUT CONTRAST    TECHNIQUE:  Contiguous axial images were obtained from the base of the skull  through the vertex without intravenous contrast.    COMPARISON:  06/02/2012  FINDINGS:  Mi low-density throughout the deep white matter, likely chronic  small vessel disease. No acute intracranial abnormality.  Specifically, no hemorrhage, hydrocephalus, mass lesion, acute  infarction, or significant intracranial injury. No acute calvarial  abnormality. Visualized paranasal sinuses and mastoids clear.  Orbital soft tissues unremarkable. Old left medial orbital wall  blowout fracture.     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    No acute intracranial abnormality.  Electronically Signed    By: Rolm Baptise M.D.    On: 10/27/2014 16:46         Verified By: Raelyn Number, M.D.,    No Known Allergies:   Vital Signs/Nurse's Notes: **Vital Signs.:   01-Apr-16 04:55  Vital Signs Type Routine  Temperature Temperature (F) 97.4  Celsius 36.3  Temperature Source oral  Pulse Pulse 68  Respirations Respirations 19  Systolic BP Systolic BP 008  Diastolic BP (mmHg) Diastolic BP (mmHg) 88  Mean BP 113  Pulse Ox % Pulse Ox % 98  Pulse Ox Activity Level  At rest  Oxygen Delivery Room Air/ 21 %    Impression 75 year old female with history of CAD with cath in 2010 that showed minimal CAD, DM2, HLD, and migraines who was recently admitted to [facility] March 29 for hypertensive urgency and demand ischemia returned to [facility] on 3/31 with uncontrolled HTN with BP greater than 200/100 and elevated  troponin that appears to be down trending from prior elevation, and chest pain.  1. Elevated troponin: -Likely down trending from initial set from prior admission in the setting of demand ischemia 2/2 hypertensive urgency with blood pressures  greater than 200/100 -She refused echo during prior admission to evaluate LV function and wall motion -Atttempt to check echo today to evaluate the above -She is only oriented to place currently, hold nuclear stress testing currently -Hold heparin gtt  2. Chest pain: -Check echo as above -Further recs pending echo  3. Hypertensive urgency: -Improved -Continue current treatment -Compliance is an issue  4. 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 CKD stage III: -Improving -Likely 2/2 IDDM and uncontrolled HTN   Electronic Signatures for Addendum Section:  Kathlyn Sacramento (MD) (Signed Addendum 01-Apr-16 11:13)  The patient was seen and examined. Agree with the above. No murmurs by exam. Echo showed an EF of 35-40% with severe LVH and global hypokinesis likely hypertensive heart disease.  Recommend blood pressure control. Continue current medications. She is already on an ACE I and a beta blocker. Needs aggressive BP control.  recommend outpatient nuclear stress test.   Electronic Signatures: Kathlyn Sacramento (MD)  (Signed 01-Apr-16 11:13)  Co-Signer: General Aspect/Present Illness, History and Physical Exam, Review of System, Family & Social History, Past Medical History, Home Medications, Labs, EKG , Radiology, Allergies, Vital Signs/Nurse's Notes, Impression/Plan Idolina Primer, Keishia Ground M (PA-C)  (Signed 01-Apr-16 08:21)  Authored: General Aspect/Present Illness, History and Physical Exam, Review of System, Family & Social History, Past Medical History, Home Medications, Labs, EKG , Radiology, Allergies, Vital Signs/Nurse's Notes, Impression/Plan   Last Updated: 01-Apr-16 11:13 by Kathlyn Sacramento (MD)

## 2014-11-27 NOTE — Consult Note (Signed)
General Aspect Primary Cardiologist: New to [facility] ___________  74 year old female with history of CAD with cath in 2010 that showed minimal CAD, DM2, HLD, and migraines who presented to [facility] on 3/28 with a 2 week history of increased dyspnea and was found to have uncontrolled HTN with BP of 202/91 and elevated troponin.  __________  PMH: 1. CAD with cath in 2010 that showed 50% stenosis of LCx 2. DM2 3. HLD 4. Migraines ___________   Present Illness 74 year old female with the above problem list who presented to [facility] on 3/28 with the above CC.  She was previously followed by outside cardiology group back in 2010 but has not follow up with them since at least that time. She was admitted to [facility] back in 2010 for chest pain and underwent echo at that time time showed EF 55%, no LV thrombus, no wall motion abnormalities, trace AI. Cardiac enzymes were negative at that time. She underwent Myoview stress test that showed borderline abnormal Myoview with apical thinning. No evidence of stress-induced myocardial ischemia and ejection fraction of 60%. Medical management recommended. The patient did not want to go home on the 22nd after the stress test was done, was concerned about her pain and had a cardiac catheterization done by Dr. 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 No intervention was done. It is unclear where the H and P from this admission gets her having a reported history of CAD with stenting x 2 from as the patient does not know if she has had any stent ever placed and there is no record in [place] or Epic, including [place] of her ever having any stenting done. It was felt she either had costochondritis vs stress induced chest pain. She was disharged on medical Rx.   She not followed up with a cardiologist since that time. She stretches out her home medications to make them last from a financial standpoint. She is to be on metoprolol,  lisinopril, omeprazole, Levemir, and pravastatin at home. She comes in with a 2 week history of intermittent SOB. No associated chest pain, diaphoresis, nausea, vomiting, presyncope, or syncope. She does not eat much salt or drink greater than 2L of fluids daily. Her weight has been stable at home. She denies any PND or orthopnea. No edema or abdominal swelling. No early satiety.   Upon her arrival to [facility] she was found to have a BP of great than 308 systolic. She was started on antihypertensives with good response in her blood pressure. Troponin was checked which showed an initial level of 0.04-->0.46-->0.51-->0.51. pBNP 546. CXR suggested small pleural effusions.  She is up sitting in chair upon me seeing her this PM.   Physical Exam:  GEN well developed, no acute distress   HEENT hearing intact to voice, moist oral mucosa   NECK supple  no JVD   RESP normal resp effort  faint crackles at the bilateral bases   CARD Regular rate and rhythm  Normal, S1, S2  Murmur   Murmur Systolic  2/6 RUSB   ABD denies tenderness  soft   LYMPH negative neck   EXTR negative edema   SKIN normal to palpation   NEURO motor/sensory function intact   PSYCH alert, A+O to time, place, person   Review of Systems:  Subjective/Chief Complaint SOB   General: Fatigue  Weakness   Skin: No Complaints   ENT: No Complaints   Eyes: No Complaints  Neck: No Complaints   Respiratory: Short of breath   Cardiovascular: Dyspnea   Gastrointestinal: No Complaints   Genitourinary: No Complaints   Vascular: No Complaints   Musculoskeletal: No Complaints   Neurologic: No Complaints   Hematologic: No Complaints   Endocrine: No Complaints   Psychiatric: No Complaints   Review of Systems: All other systems were reviewed and found to be negative   Medications/Allergies Reviewed Medications/Allergies reviewed   Family & Social History:  Family and Social History:  Family History Negative   Hypertension   Social History positive  tobacco, positive  tobacco (Current within 1 year), negative ETOH, negative Illicit drugs, chews tobacco   Place of Living Home     Migraines:    Osteoarthritis:    Diabetes:    CHF:    CAD:    HTN:    Stent - Cardiac:    Hysterectomy:   Home Medications: Medication Instructions Status  amlodipine 10 mg oral tablet 1 tab(s) orally once a day Active  hydrochlorothiazide 25 mg oral tablet 1 tab(s) orally once a day Active  simvastatin 40 mg oral tablet 1 tab(s) orally once a day (at bedtime) Active  metoprolol succinate 50 mg oral tablet, extended release 1 tab(s) orally once a day Active  potassium chloride 20 mEq oral tablet, extended release 1 tab(s) orally once a day Active  omeprazole 40 mg oral delayed release capsule 1 cap(s) orally 2 times a day Active  Levemir 100 units/mL subcutaneous solution 25 unit(s) subcutaneous once a day Active   Lab Results:  Routine Chem:  28-Mar-16 23:18   Glucose, Serum  238 (65-99 NOTE: New Reference Range  10/04/14)  Potassium, Serum 3.6 (3.5-5.1 NOTE: New Reference Range  10/04/14)  B-Type Natriuretic Peptide ([facility])  546 (0-99 NOTE: New Reference Range:  10/04/14)  29-Mar-16 10:44   Creatinine (comp)  1.34 (0.44-1.00 NOTE: New Reference Range  10/04/14)  eGFR (African American)  46  eGFR (Non-African American)  40 (eGFR values <21m/min/1.73 m2 may be an indication of chronic kidney disease (CKD). Calculated eGFR is useful in patients with stable renal function. The eGFR calculation will not be reliable in acutely ill patients when serum creatinine is changing rapidly. It is not useful in patients on dialysis. The eGFR calculation may not be applicable to patients at the low and high extremes of body sizes, pregnant women, and vegetarians.)  Result Comment - TROPONIN PREVIOUSLY CALLED 10/24/14  - AT 2352 BY K[facility]-DAS  Result(s) reported on 25 Oct 2014 at 11:33AM.  Cardiac:   28-Mar-16 23:18   Troponin I  0.04 (0.00-0.03 0.03 ng/mL or less: NEGATIVE  Repeat testing in 3-6 hrs  if clinically indicated. >0.05 ng/mL: POTENTIAL  MYOCARDIAL INJURY. Repeat  testing in 3-6 hrs if  clinically indicated. NOTE: An increase or decrease  of 30% or more on serial  testing suggests a  clinically important change NOTE: New Reference Range  10/04/14)  29-Mar-16 03:45   Troponin I  0.46 (0.00-0.03 0.03 ng/mL or less: NEGATIVE  Repeat testing in 3-6 hrs  if clinically indicated. >0.05 ng/mL: POTENTIAL  MYOCARDIAL INJURY. Repeat  testing in 3-6 hrs if  clinically indicated. NOTE: An increase or decrease  of 30% or more on serial  testing suggests a  clinically important change NOTE: New Reference Range  10/04/14)    08:23   Troponin I  0.51 (0.00-0.03 0.03 ng/mL or less: NEGATIVE  Repeat testing in 3-6 hrs  if clinically indicated. >0.05 ng/mL: POTENTIAL  MYOCARDIAL  INJURY. Repeat  testing in 3-6 hrs if  clinically indicated. NOTE: An increase or decrease  of 30% or more on serial  testing suggests a  clinically important change NOTE: New Reference Range  10/04/14)    10:44   Troponin I  0.51 (0.00-0.03 0.03 ng/mL or less: NEGATIVE  Repeat testing in 3-6 hrs  if clinically indicated. >0.05 ng/mL: POTENTIAL  MYOCARDIAL INJURY. Repeat  testing in 3-6 hrs if  clinically indicated. NOTE: An increase or decrease  of 30% or more on serial  testing suggests a  clinically important change NOTE: New Reference Range  10/04/14)  CPK-MB, Serum  5.8 (0.5-5.0 NOTE: New Reference Range  10/04/14)  Routine Hem:  28-Mar-16 23:18   Hematocrit (CBC) 39.9   EKG:  EKG Interp. by me   Interpretation EKG shows NSR, 100 bpm, TWI I, II, V4-V6   Radiology Results: XRay:    28-Mar-16 23:27, Chest Portable Single View  Chest Portable Single View   REASON FOR EXAM:    Chest pain  COMMENTS:       PROCEDURE: DXR - DXR PORTABLE CHEST SINGLE VIEW  - Oct 24 2014 11:27PM     CLINICAL DATA:  Cough and difficulty breathing for 1 day.    EXAM:  PORTABLE CHEST - 1 VIEW    COMPARISON:  07/17/2009    FINDINGS:  The heart is enlarged. Mild vascular congestion, no convincing  pulmonary edema. Minimal bibasilar atelectasis. Trace blunting of  the costophrenic angles. No confluent airspace disease. No  pneumothorax. No acute osseous abnormalities are seen.    IMPRESSION:  Cardiomegaly with mild vascular congestion and question small  pleural effusions. Findings suggest fluid overload/CHF.      Electronically Signed    By: Jeb Levering M.D.    On: 10/25/2014 00:19         Verified By: Rollene Fare. Marisue Humble, M.D.,    No Known Allergies:   Vital Signs/Nurse's Notes: **Vital Signs.:   29-Mar-16 11:16  Vital Signs Type Routine  Temperature Temperature (F) 97.9  Celsius 36.6  Temperature Source oral  Pulse Pulse 72  Respirations Respirations 18  Systolic BP Systolic BP 161  Diastolic BP (mmHg) Diastolic BP (mmHg) 83  Mean BP 111  Pulse Ox % Pulse Ox % 95  Pulse Ox Activity Level  At rest  Oxygen Delivery Room Air/ 21 %    Impression 74 year old female with history of CAD with cath in 2010 that showed minimal CAD, DM2, HLD, and migraines who presented to [facility] on 3/28 with a 2 week history of increased dyspnea and was found to have uncontrolled HTN with BP of 202/91 and elevated troponin.   1. Elevated troponin: -Troponin appears to leveling off c/w demand ischemia in the setting of accelerated hypertension with blood pressures 096 systolic upon presentation -Check echo to evaluate LV function and wall motion -If echo is normal could plan for outpatient nuclear stress test  2. CAD: -Cardiac cath 06/2009 that showed 50% stenosis of pLCX and heavy calcification of the proximal vessels - no intervention performed -No record of stenting done since that time  -Check echo as above, pursue nuclear stress testing if normal -Continue  Toprol XL 50 mg daily, decrease aspirin to 81 mg daily  3. Accelerated HTN: -Improved with current treatment -Continue current medications with goal of most cost effective add lisinopril 20 mg daily given diabetes  4. SOB: -Check echo as above to evaluate LV function and wall motion -CXR  showed   5. IDDM: -Per IM   Electronic Signatures: Rise Mu (PA-C)  (Signed 29-Mar-16 12:40)  Authored: General Aspect/Present Illness, History and Physical Exam, Review of System, Family & Social History, Past Medical History, Home Medications, Labs, EKG , Radiology, Allergies, Vital Signs/Nurse's Notes, Impression/Plan Ida Rogue (MD)  (Signed 29-Mar-16 13:44)  Authored: General Aspect/Present Illness, History and Physical Exam, Review of System, Family & Social History, Labs, EKG , Impression/Plan  Co-Signer: General Aspect/Present Illness, History and Physical Exam, Review of System, Family & Social History, Past Medical History, Home Medications, Labs, EKG , Radiology, Allergies, Vital Signs/Nurse's Notes, Impression/Plan   Last Updated: 29-Mar-16 13:44 by Ida Rogue (MD)

## 2014-11-28 DIAGNOSIS — E119 Type 2 diabetes mellitus without complications: Secondary | ICD-10-CM | POA: Diagnosis not present

## 2014-11-28 DIAGNOSIS — Z7982 Long term (current) use of aspirin: Secondary | ICD-10-CM | POA: Diagnosis not present

## 2014-11-28 DIAGNOSIS — G309 Alzheimer's disease, unspecified: Secondary | ICD-10-CM | POA: Diagnosis not present

## 2014-11-28 DIAGNOSIS — I129 Hypertensive chronic kidney disease with stage 1 through stage 4 chronic kidney disease, or unspecified chronic kidney disease: Secondary | ICD-10-CM | POA: Diagnosis not present

## 2014-11-28 DIAGNOSIS — I2510 Atherosclerotic heart disease of native coronary artery without angina pectoris: Secondary | ICD-10-CM | POA: Diagnosis not present

## 2014-11-28 DIAGNOSIS — F0280 Dementia in other diseases classified elsewhere without behavioral disturbance: Secondary | ICD-10-CM | POA: Diagnosis not present

## 2014-11-28 DIAGNOSIS — G43909 Migraine, unspecified, not intractable, without status migrainosus: Secondary | ICD-10-CM | POA: Diagnosis not present

## 2014-11-28 DIAGNOSIS — Z794 Long term (current) use of insulin: Secondary | ICD-10-CM | POA: Diagnosis not present

## 2014-11-28 DIAGNOSIS — N183 Chronic kidney disease, stage 3 (moderate): Secondary | ICD-10-CM | POA: Diagnosis not present

## 2014-11-30 DIAGNOSIS — I200 Unstable angina: Secondary | ICD-10-CM | POA: Diagnosis not present

## 2014-11-30 DIAGNOSIS — R079 Chest pain, unspecified: Secondary | ICD-10-CM | POA: Diagnosis not present

## 2014-12-02 ENCOUNTER — Encounter: Payer: Self-pay | Admitting: Cardiovascular Disease

## 2014-12-02 ENCOUNTER — Ambulatory Visit (INDEPENDENT_AMBULATORY_CARE_PROVIDER_SITE_OTHER): Payer: Commercial Managed Care - HMO | Admitting: Cardiovascular Disease

## 2014-12-02 VITALS — BP 136/60 | HR 70 | Ht 64.0 in | Wt 155.0 lb

## 2014-12-02 DIAGNOSIS — R0602 Shortness of breath: Secondary | ICD-10-CM

## 2014-12-02 DIAGNOSIS — E785 Hyperlipidemia, unspecified: Secondary | ICD-10-CM | POA: Diagnosis not present

## 2014-12-02 DIAGNOSIS — I119 Hypertensive heart disease without heart failure: Secondary | ICD-10-CM

## 2014-12-02 DIAGNOSIS — I43 Cardiomyopathy in diseases classified elsewhere: Secondary | ICD-10-CM

## 2014-12-02 DIAGNOSIS — R079 Chest pain, unspecified: Secondary | ICD-10-CM | POA: Diagnosis not present

## 2014-12-02 DIAGNOSIS — I429 Cardiomyopathy, unspecified: Secondary | ICD-10-CM

## 2014-12-02 DIAGNOSIS — E1159 Type 2 diabetes mellitus with other circulatory complications: Secondary | ICD-10-CM

## 2014-12-02 DIAGNOSIS — I10 Essential (primary) hypertension: Secondary | ICD-10-CM

## 2014-12-02 DIAGNOSIS — I2510 Atherosclerotic heart disease of native coronary artery without angina pectoris: Secondary | ICD-10-CM

## 2014-12-02 NOTE — Assessment & Plan Note (Addendum)
Cardiac catheterization in 2010 with 50% left circumflex disease. Now with cardiomyopathy, stuttering left-sided chest pain, possibly angina. We have ordered a pharmacologic Myoview. Patient reports she would have difficulty with treadmill

## 2014-12-02 NOTE — Assessment & Plan Note (Signed)
Dietary guide provided to her today We have encouraged continued exercise, careful diet management in an effort to lose weight.

## 2014-12-02 NOTE — Assessment & Plan Note (Signed)
Encouraged her to stay on her statin, goal LDL less than 100

## 2014-12-02 NOTE — Patient Instructions (Addendum)
You are doing well. No medication changes were made.  We will schedule a stress test for coronary artery disease, chest pain, elevated cardiac enz in the hospital  Please call us if you have new issues that need to be addressed before your next appt.  Your physician wants you to follow-up in: 6 months.  You will receive a reminder letter in the mail two months in advance. If you don't receive a letter, please call our office to schedule the follow-up appointment.  [place]  Your caregiver has ordered a Stress Test with nuclear imaging. The purpose of this test is to evaluate the blood supply to your heart muscle. This procedure is referred to as a "Non-Invasive Stress Test." This is because other than having an IV started in your vein, nothing is inserted or "invades" your body. Cardiac stress tests are done to find areas of poor blood flow to the heart by determining the extent of coronary artery disease (CAD). Some patients exercise on a treadmill, which naturally increases the blood flow to your heart, while others who are  unable to walk on a treadmill due to physical limitations have a pharmacologic/chemical stress agent called Lexiscan . This medicine will mimic walking on a treadmill by temporarily increasing your coronary blood flow.   Please note: these test may take anywhere between 2-4 hours to complete  PLEASE REPORT TO [facility] AT THE FIRST DESK WILL DIRECT YOU WHERE TO GO  Date of Procedure:_________Thursday, June 9_______  Arrival Time for Procedure:________7:45 am__________  Instructions regarding medication:   __X__:  Hold AMLODIPINE & METOPROLOL the night before procedure and morning of procedure   PLEASE NOTIFY THE OFFICE AT LEAST 24 HOURS IN ADVANCE IF YOU ARE UNABLE TO KEEP YOUR APPOINTMENT.  [phone] AND  PLEASE NOTIFY NUCLEAR MEDICINE AT [facility] AT LEAST 24 HOURS IN ADVANCE IF YOU ARE UNABLE TO KEEP YOUR APPOINTMENT.  [phone]  How to prepare for your Myoview test:  1. Do not eat or drink after midnight 2. No caffeine for 24 hours prior to test 3. No smoking 24 hours prior to test. 4. Your medication may be taken with water.  If your doctor stopped a medication because of this test, do not take that medication. 5. Ladies, please do not wear dresses.  Skirts or pants are appropriate. Please wear a short sleeve shirt. 6. No perfume, cologne or lotion.

## 2014-12-02 NOTE — Progress Notes (Signed)
Patient ID: Alyssa [name], female    DOB: Feb 01, 1941, 74 y.o.   MRN: [id number]  HPI Comments: 74 year old female with history of CAD with cath in 2010 that showed minimal CAD, DM2, HLD, and migraines who was admitted to [facility] October 25 2014 for hypertensive urgency and demand ischemia returned to [facility] on 10/27/14 with uncontrolled HTN with BP greater than 200/100 and elevated troponin  and chest pain. She presents to establish care in the [place] office and for management of her blood pressure.  Since her discharge, friend of hers has been managing her blood pressure medications She reports compliance with her medications. Sometimes has waxing and waning left-sided chest pain. Active at baseline. Denies any significant leg edema, shortness of breath.  Recent hospital admission reviewed with her including lab work, echocardiogram Echo showed an EF of 35-40% with severe LVH and global hypokinesis likely hypertensive heart disease.   outpatient nuclear stress test was recommended at the time of discharge  EKG on today's visit shows normal sinus rhythm with rate 70 bpm, diffuse T-wave abnormality, LVH  Other past medical history She was admitted to [facility] back in 2010 for chest pain echo at that time time showed EF 55%, no LV thrombus, no wall motion abnormalities, trace AI.  Cardiac enzymes were negative at that time.  She underwent Myoview stress test that showed borderline abnormal Myoview with apical thinning. No evidence of stress-induced myocardial ischemia and ejection fraction of 60%. Medical management recommended.   The patient did not want to go home on the 22nd after the stress test was done, was concerned about her pain and had a cardiac catheterization done by Dr. 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 No intervention was done.   It was felt she either had costochondritis vs stress induced chest pain.      No Known  Allergies  Current Outpatient Prescriptions on File Prior to Visit  Medication Sig Dispense Refill  . E-Z Ject Lancets Super Thin MISC     . hydrochlorothiazide (HYDRODIURIL) 25 MG tablet Take 1 tablet by mouth daily.    . insulin detemir (LEVEMIR) 100 UNIT/ML injection Inject 25 Units into the skin every morning.    . Insulin Syringe-Needle U-100 (INSULIN SYRINGE .3CC/31GX5/16") 31G X 5/16" 0.3 ML MISC as directed.    . metoprolol succinate (TOPROL-XL) 50 MG 24 hr tablet Take 1 tablet by mouth daily.    Marland Kitchen omeprazole (PRILOSEC) 40 MG capsule Take 1 capsule by mouth 2 (two) times daily.    . potassium chloride SA (K-DUR,KLOR-CON) 20 MEQ tablet Take 1 tablet by mouth daily.    . QUEtiapine (SEROQUEL) 25 MG tablet Take 1 tablet by mouth at bedtime.    . simvastatin (ZOCOR) 40 MG tablet Take 1 tablet by mouth daily.     No current facility-administered medications on file prior to visit.    Past Medical History  Diagnosis Date  . Cardiomyopathy due to hypertension 2016  . Chronic renal insufficiency, stage III (moderate)   . Hypokalemia   . Hypertension   . Diabetes mellitus without complication   . Hyperlipidemia     Past Surgical History  Procedure Laterality Date  . Abdominal hysterectomy    . Tonsillectomy    . Cholecystectomy      Social History  reports that she quit smoking about 32 years ago. Her smoking use included Cigarettes. She has a 2.5 pack-year smoking history. She does not have  any smokeless tobacco history on file. She reports that she drinks about 0.6 oz of alcohol per week. She reports that she does not use illicit drugs.  Family History family history includes Diabetes in her mother; Hypertension in her mother.   Review of Systems  Constitutional: Negative.   Respiratory: Negative.   Cardiovascular: Negative.   Gastrointestinal: Negative.   Musculoskeletal: Negative.   Skin: Negative.   Neurological: Negative.   Hematological: Negative.    Psychiatric/Behavioral: Negative.   All other systems reviewed and are negative.   BP 136/60 mmHg  Pulse 70  Ht 5\' 4"  (1.626 m)  Wt 155 lb (70.308 kg)  BMI 26.59 kg/m2  Physical Exam  Constitutional: She is oriented to person, place, and time. She appears well-developed and well-nourished.  HENT:  Head: Normocephalic.  Nose: Nose normal.  Mouth/Throat: Oropharynx is clear and moist.  Eyes: Conjunctivae are normal. Pupils are equal, round, and reactive to light.  Neck: Normal range of motion. Neck supple. No JVD present.  Cardiovascular: Normal rate, regular rhythm, S1 normal, S2 normal, normal heart sounds and intact distal pulses.  Exam reveals no gallop and no friction rub.   No murmur heard. Pulmonary/Chest: Effort normal and breath sounds normal. No respiratory distress. She has no wheezes. She has no rales. She exhibits no tenderness.  Abdominal: Soft. Bowel sounds are normal. She exhibits no distension. There is no tenderness.  Musculoskeletal: Normal range of motion. She exhibits no edema or tenderness.  Lymphadenopathy:    She has no cervical adenopathy.  Neurological: She is alert and oriented to person, place, and time. Coordination normal.  Skin: Skin is warm and dry. No rash noted. No erythema.  Psychiatric: She has a normal mood and affect. Her behavior is normal. Judgment and thought content normal.    Assessment and Plan  Nursing note and vitals reviewed.

## 2014-12-02 NOTE — Assessment & Plan Note (Signed)
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 We will arrange stress test to rule out ischemia given known coronary artery disease

## 2014-12-02 NOTE — Assessment & Plan Note (Signed)
We'll continue current medications, blood pressure in acceptable range

## 2014-12-05 NOTE — Patient Outreach (Signed)
[facility]) Care Management  12/05/2014  Alyssa [name] 09/07/1940 [id number]   RN CM has been unable to establish and/ or maintain contact with this patient.  RN CM will close this case as per [facility] workflow and send a case closure letter to primary physician.    Maury Dus, RN, Ishmael Holter, [place] Telephonic Care Coordinator [phone]

## 2014-12-05 NOTE — Patient Outreach (Signed)
[place] [facility]) Care Management  12/05/2014  Alyssa [name] 09/19/1940 [id number]   Received notification from Maury Dus, RN to close case due to unable to establish/maintian contact with patient.  Ronnell Freshwater. [place] CM Assistant Phone: [phone] Fax: [phone]

## 2014-12-08 DIAGNOSIS — N183 Chronic kidney disease, stage 3 (moderate): Secondary | ICD-10-CM | POA: Diagnosis not present

## 2014-12-08 DIAGNOSIS — G43909 Migraine, unspecified, not intractable, without status migrainosus: Secondary | ICD-10-CM | POA: Diagnosis not present

## 2014-12-08 DIAGNOSIS — F0280 Dementia in other diseases classified elsewhere without behavioral disturbance: Secondary | ICD-10-CM | POA: Diagnosis not present

## 2014-12-08 DIAGNOSIS — Z794 Long term (current) use of insulin: Secondary | ICD-10-CM | POA: Diagnosis not present

## 2014-12-08 DIAGNOSIS — E119 Type 2 diabetes mellitus without complications: Secondary | ICD-10-CM | POA: Diagnosis not present

## 2014-12-08 DIAGNOSIS — G309 Alzheimer's disease, unspecified: Secondary | ICD-10-CM | POA: Diagnosis not present

## 2014-12-08 DIAGNOSIS — I2510 Atherosclerotic heart disease of native coronary artery without angina pectoris: Secondary | ICD-10-CM | POA: Diagnosis not present

## 2014-12-08 DIAGNOSIS — Z7982 Long term (current) use of aspirin: Secondary | ICD-10-CM | POA: Diagnosis not present

## 2014-12-08 DIAGNOSIS — I129 Hypertensive chronic kidney disease with stage 1 through stage 4 chronic kidney disease, or unspecified chronic kidney disease: Secondary | ICD-10-CM | POA: Diagnosis not present

## 2014-12-16 DIAGNOSIS — E119 Type 2 diabetes mellitus without complications: Secondary | ICD-10-CM | POA: Diagnosis not present

## 2014-12-16 DIAGNOSIS — I2510 Atherosclerotic heart disease of native coronary artery without angina pectoris: Secondary | ICD-10-CM | POA: Diagnosis not present

## 2014-12-16 DIAGNOSIS — I129 Hypertensive chronic kidney disease with stage 1 through stage 4 chronic kidney disease, or unspecified chronic kidney disease: Secondary | ICD-10-CM | POA: Diagnosis not present

## 2014-12-16 DIAGNOSIS — N183 Chronic kidney disease, stage 3 (moderate): Secondary | ICD-10-CM | POA: Diagnosis not present

## 2014-12-16 DIAGNOSIS — G43909 Migraine, unspecified, not intractable, without status migrainosus: Secondary | ICD-10-CM | POA: Diagnosis not present

## 2014-12-16 DIAGNOSIS — F0280 Dementia in other diseases classified elsewhere without behavioral disturbance: Secondary | ICD-10-CM | POA: Diagnosis not present

## 2014-12-16 DIAGNOSIS — Z7982 Long term (current) use of aspirin: Secondary | ICD-10-CM | POA: Diagnosis not present

## 2014-12-16 DIAGNOSIS — G309 Alzheimer's disease, unspecified: Secondary | ICD-10-CM | POA: Diagnosis not present

## 2014-12-16 DIAGNOSIS — Z794 Long term (current) use of insulin: Secondary | ICD-10-CM | POA: Diagnosis not present

## 2015-01-05 ENCOUNTER — Ambulatory Visit: Payer: Commercial Managed Care - HMO

## 2015-01-05 ENCOUNTER — Encounter
Admission: RE | Admit: 2015-01-05 | Discharge: 2015-01-05 | Disposition: A | Discharge status: Home / Self Care | Payer: Commercial Managed Care - HMO | Source: Ambulatory Visit | Attending: Cardiovascular Disease | Admitting: Cardiovascular Disease

## 2015-01-05 DIAGNOSIS — R0602 Shortness of breath: Secondary | ICD-10-CM | POA: Diagnosis not present

## 2015-01-05 DIAGNOSIS — R079 Chest pain, unspecified: Secondary | ICD-10-CM | POA: Diagnosis not present

## 2015-01-05 MED ORDER — REGADENOSON 0.4 MG/5ML IV SOLN
0.4000 mg | Freq: Once | INTRAVENOUS | Status: AC
  Administered 2015-01-05: 0.4 mg via INTRAVENOUS

## 2015-01-05 MED ORDER — TECHNETIUM TC 99M SESTAMIBI - CARDIOLITE
13.3400 | Freq: Once | INTRAVENOUS | Status: AC | PRN
  Administered 2015-01-05: 13.34 via INTRAVENOUS

## 2015-01-05 MED ORDER — TECHNETIUM TC 99M SESTAMIBI - CARDIOLITE
27.7530 | Freq: Once | INTRAVENOUS | Status: AC | PRN
  Administered 2015-01-05: 09:00:00 27.753 via INTRAVENOUS

## 2015-01-06 LAB — NM MYOCAR MULTI W/SPECT W/WALL MOTION / EF
CHL CUP NUCLEAR SDS: 0
CSEPED: 0 min
CSEPHR: 63 %
CSEPPHR: 95 {beats}/min
Estimated workload: 1 METS
Exercise duration (sec): 0 s
LV dias vol: 105 mL
LV sys vol: 64 mL
MPHR: 149 {beats}/min
NUC STRESS EF: 33 %
Rest HR: 63 {beats}/min
SRS: 9
SSS: 6
TID: 0.88

## 2015-02-06 ENCOUNTER — Other Ambulatory Visit: Payer: Self-pay | Admitting: Internal Medicine

## 2015-02-10 ENCOUNTER — Other Ambulatory Visit: Payer: Self-pay | Admitting: Internal Medicine

## 2015-02-15 ENCOUNTER — Ambulatory Visit: Payer: Self-pay | Admitting: Internal Medicine

## 2015-03-07 ENCOUNTER — Ambulatory Visit: Payer: Self-pay | Admitting: Internal Medicine

## 2015-03-07 ENCOUNTER — Ambulatory Visit (INDEPENDENT_AMBULATORY_CARE_PROVIDER_SITE_OTHER): Payer: Commercial Managed Care - HMO | Admitting: Family Medicine

## 2015-03-07 ENCOUNTER — Encounter: Payer: Self-pay | Admitting: Family Medicine

## 2015-03-07 VITALS — BP 188/90 | HR 60 | Ht 64.0 in | Wt 163.4 lb

## 2015-03-07 DIAGNOSIS — I10 Essential (primary) hypertension: Secondary | ICD-10-CM

## 2015-03-07 DIAGNOSIS — G309 Alzheimer's disease, unspecified: Secondary | ICD-10-CM

## 2015-03-07 DIAGNOSIS — N183 Chronic kidney disease, stage 3 unspecified: Secondary | ICD-10-CM | POA: Insufficient documentation

## 2015-03-07 DIAGNOSIS — I2510 Atherosclerotic heart disease of native coronary artery without angina pectoris: Secondary | ICD-10-CM

## 2015-03-07 DIAGNOSIS — I43 Cardiomyopathy in diseases classified elsewhere: Secondary | ICD-10-CM

## 2015-03-07 DIAGNOSIS — F0280 Dementia in other diseases classified elsewhere without behavioral disturbance: Secondary | ICD-10-CM

## 2015-03-07 DIAGNOSIS — E785 Hyperlipidemia, unspecified: Secondary | ICD-10-CM

## 2015-03-07 DIAGNOSIS — I429 Cardiomyopathy, unspecified: Secondary | ICD-10-CM

## 2015-03-07 DIAGNOSIS — I119 Hypertensive heart disease without heart failure: Secondary | ICD-10-CM | POA: Diagnosis not present

## 2015-03-07 DIAGNOSIS — E1159 Type 2 diabetes mellitus with other circulatory complications: Secondary | ICD-10-CM | POA: Diagnosis not present

## 2015-03-07 MED ORDER — HYDROCHLOROTHIAZIDE 25 MG PO TABS: 25.0000 mg | ORAL_TABLET | Freq: Every day | ORAL | Status: DC

## 2015-03-07 MED ORDER — AMLODIPINE BESYLATE 10 MG PO TABS: 10.0000 mg | ORAL_TABLET | Freq: Every day | ORAL | Status: DC

## 2015-03-07 MED ORDER — ISOSORBIDE MONONITRATE ER 30 MG PO TB24: 30.0000 mg | ORAL_TABLET | Freq: Every day | ORAL | Status: DC

## 2015-03-07 NOTE — Progress Notes (Signed)
Date:  03/07/2015   Name:  Alyssa [name]   DOB:  1941/05/16   MRN:  [id number]  PCP:  Halina Maidens, MD    Chief Complaint: Diabetes; Hypertension; and Hyperlipidemia   History of Present Illness:  This is a 74 y.o. female for f/u HTN, DM, HLD, CKD, cardiomyopathy, dementia. Was hospitalized in April for accelerated HTN felt due to noncompliance and restarted on usual meds. She was started on Seroquel at that time for sleep. Family has done a better job of getting her to take her meds since but have run out of HCTZ, amlodipine, and isosorbide (unlcear if she has taken past several days). Has seen cardiology since discharge but no med changes made per family. She is on insulin but not metformin. Lab review shows elevated TSH 10/27/14 and LDL 134 10/28/14 (unclear if taking simvastatin at time). Family reports weight up 5-10# lately, still having occ chest pain when eats greasy food. SOB with exertion but gets no exercise. No edema noted. Sleeping better on Seroquel. BP adequately controlled when saw Dr. Philmore Pali in May.  Review of Systems:  Review of Systems  Patient Active Problem List   Diagnosis Date Noted  . Chronic kidney disease, stage 3 03/07/2015  . Coronary artery disease involving native coronary artery of native heart without angina pectoris 12/02/2014  . Allergic rhinitis 11/15/2014  . HLD (hyperlipidemia) 11/15/2014  . BP (high blood pressure) 11/15/2014  . Arthritis, degenerative 11/15/2014  . Cardiomyopathy due to hypertension 11/15/2014  . Hypokalemia 11/15/2014  . H/O gastrointestinal hemorrhage 03/28/2011  . Diabetes 03/28/2011    Prior to Admission medications   Medication Sig Start Date End Date Taking? Authorizing Provider  amLODipine (NORVASC) 10 MG tablet Take 1 tablet (10 mg total) by mouth daily. 03/07/15  Yes Adline Potter, MD  aspirin EC 81 MG tablet Take 81 mg by mouth daily.   Yes Historical Provider, MD  E-Z Chanetta Marshall Lancets Super Thin MISC  11/02/14  Yes Historical  Provider, MD  hydrochlorothiazide (HYDRODIURIL) 25 MG tablet Take 1 tablet (25 mg total) by mouth daily. 03/07/15  Yes Adline Potter, MD  insulin detemir (LEVEMIR) 100 UNIT/ML injection Inject 25 Units into the skin every morning. 06/06/11  Yes Historical Provider, MD  Insulin Syringe-Needle U-100 (INSULIN SYRINGE .3CC/31GX5/16") 31G X 5/16" 0.3 ML MISC as directed. 05/31/11  Yes Historical Provider, MD  isosorbide mononitrate (IMDUR) 30 MG 24 hr tablet Take 1 tablet (30 mg total) by mouth daily. 03/07/15  Yes Adline Potter, MD  lisinopril (PRINIVIL,ZESTRIL) 20 MG tablet Take 20 mg by mouth daily.   Yes Historical Provider, MD  metoprolol succinate (TOPROL-XL) 50 MG 24 hr tablet Take 1 tablet by mouth daily. 02/20/11  Yes Historical Provider, MD  omeprazole (PRILOSEC) 40 MG capsule TAKE (1) CAPSULE BY MOUTH TWICE DAILY 02/06/15  Yes Glean Hess, MD  potassium chloride SA (K-DUR,KLOR-CON) 20 MEQ tablet Take 1 tablet by mouth daily. 10/08/14  Yes Historical Provider, MD  QUEtiapine (SEROQUEL) 25 MG tablet Take 1 tablet by mouth at bedtime. 10/31/14  Yes Historical Provider, MD  simvastatin (ZOCOR) 40 MG tablet Take 1 tablet by mouth daily. 10/18/14  Yes Historical Provider, MD  TRUETRACK TEST test strip  01/25/15  Yes Historical Provider, MD  [place] X 1/2" 0.3 ML MISC  01/10/15   Historical Provider, MD    No Known Allergies  Past Surgical History  Procedure Laterality Date  . Abdominal hysterectomy    . Tonsillectomy    .  Cholecystectomy      History  Substance Use Topics  . Smoking status: Former Smoker -- 0.25 packs/day for 10 years    Types: Cigarettes    Quit date: 09/11/1982  . Smokeless tobacco: Not on file  . Alcohol Use: 0.6 oz/week    1 Cans of beer per week    Family History  Problem Relation Age of Onset  . Diabetes Mother   . Hypertension Mother     Medication list has been reviewed and updated.  Physical Examination: BP 188/90 mmHg  Pulse 60  Ht  5\' 4"  (1.626 m)  Wt 163 lb 6.4 oz (74.118 kg)  BMI 28.03 kg/m2  Physical Exam  Constitutional: She appears well-nourished. No distress.  Neck: No thyromegaly present.  Cardiovascular: Normal rate, regular rhythm and normal heart sounds.   Pulmonary/Chest: Effort normal and breath sounds normal.  Abdominal: Soft. She exhibits no distension. There is no tenderness.  Musculoskeletal: She exhibits no edema.  Neurological: She is alert. Coordination normal.  Psychiatric: She has a normal mood and affect. Her behavior is normal.  Nursing note and vitals reviewed.   Assessment and Plan:  1. Essential hypertension Uncontrolled, likely off usual Norvasc/HCTZ, was well controlled in May, refill meds  - amLODipine (NORVASC) 10 MG tablet; Take 1 tablet (10 mg total) by mouth daily.  Dispense: 90 tablet; Refill: 3 - hydrochlorothiazide (HYDRODIURIL) 25 MG tablet; Take 1 tablet (25 mg total) by mouth daily.  Dispense: 90 tablet; Refill: 3  2. Cardiomyopathy due to hypertension, without heart failure Elevated TSH in March, recheck today, cardiology following - TSH  3. Type 2 diabetes mellitus with other circulatory complications Unclear control, consider adding metformin given weight gain (ok with stage 3 CKD) - HgB A1c - Urine Microalbumin w/creat. ratio  4. Chronic kidney disease, stage 3 - Basic Metabolic Panel (BMET)  5. HLD (hyperlipidemia) LDL 139 in April, recheck today, consider atorvastatin  6. Coronary artery disease involving native coronary artery of native heart without angina pectoris Stable, CP may be more reflux but will refill Imdur, continue asa, cardiology following - isosorbide mononitrate (IMDUR) 30 MG 24 hr tablet; Take 1 tablet (30 mg total) by mouth daily.  Dispense: 90 tablet; Refill: 3  7. Alz dementia Seroquel associated with worse outcomes, consider d/c  Return in about 3 months (around 06/07/2015).  Satira Anis. [facility]  03/07/2015

## 2015-03-08 LAB — BASIC METABOLIC PANEL
BUN / CREAT RATIO: 13 (ref 11–26)
BUN: 17 mg/dL (ref 8–27)
CALCIUM: 9.3 mg/dL (ref 8.7–10.3)
CHLORIDE: 94 mmol/L — AB (ref 97–108)
CO2: 26 mmol/L (ref 18–29)
Creatinine, Ser: 1.28 mg/dL — ABNORMAL HIGH (ref 0.57–1.00)
GFR calc Af Amer: 49 mL/min/{1.73_m2} — ABNORMAL LOW (ref >59)
GFR calc non Af Amer: 42 mL/min/{1.73_m2} — ABNORMAL LOW (ref >59)
Glucose: 186 mg/dL — ABNORMAL HIGH (ref 65–99)
Potassium: 4.8 mmol/L (ref 3.5–5.2)
Sodium: 134 mmol/L (ref 134–144)

## 2015-03-08 LAB — MICROALBUMIN / CREATININE URINE RATIO
Creatinine, Urine: 30.1 mg/dL
MICROALB/CREAT RATIO: 2228.6 mg/g creat — ABNORMAL HIGH (ref 0.0–30.0)
Microalbumin, Urine: 670.8 ug/mL

## 2015-03-08 LAB — TSH: TSH: 2.76 u[IU]/mL (ref 0.450–4.500)

## 2015-03-08 LAB — HEMOGLOBIN A1C
ESTIMATED AVERAGE GLUCOSE: 192 mg/dL
Hgb A1c MFr Bld: 8.3 % — ABNORMAL HIGH (ref 4.8–5.6)

## 2015-03-08 MED ORDER — METFORMIN HCL 500 MG PO TABS: 500.0000 mg | ORAL_TABLET | Freq: Two times a day (BID) | ORAL | Status: DC

## 2015-03-08 NOTE — Addendum Note (Signed)
Addended by: Adline Potter on: 03/08/2015 09:12 AM   Modules accepted: Orders, Medications

## 2015-03-10 ENCOUNTER — Other Ambulatory Visit: Payer: Self-pay | Admitting: Family Medicine

## 2015-03-10 MED ORDER — LISINOPRIL 40 MG PO TABS: 40.0000 mg | ORAL_TABLET | Freq: Every day | ORAL | Status: DC

## 2015-04-24 ENCOUNTER — Other Ambulatory Visit: Payer: Self-pay | Admitting: Internal Medicine

## 2015-05-09 ENCOUNTER — Other Ambulatory Visit: Payer: Self-pay | Admitting: Internal Medicine

## 2015-05-09 ENCOUNTER — Other Ambulatory Visit: Payer: Self-pay | Admitting: Family Medicine

## 2015-06-05 ENCOUNTER — Ambulatory Visit (INDEPENDENT_AMBULATORY_CARE_PROVIDER_SITE_OTHER): Payer: Commercial Managed Care - HMO | Admitting: Internal Medicine

## 2015-06-05 ENCOUNTER — Encounter: Payer: Self-pay | Admitting: Internal Medicine

## 2015-06-05 VITALS — BP 180/90 | HR 80 | Ht 64.0 in | Wt 158.8 lb

## 2015-06-05 DIAGNOSIS — E782 Mixed hyperlipidemia: Secondary | ICD-10-CM | POA: Diagnosis not present

## 2015-06-05 DIAGNOSIS — E876 Hypokalemia: Secondary | ICD-10-CM

## 2015-06-05 DIAGNOSIS — N183 Chronic kidney disease, stage 3 unspecified: Secondary | ICD-10-CM

## 2015-06-05 DIAGNOSIS — I119 Hypertensive heart disease without heart failure: Secondary | ICD-10-CM

## 2015-06-05 DIAGNOSIS — E118 Type 2 diabetes mellitus with unspecified complications: Secondary | ICD-10-CM | POA: Diagnosis not present

## 2015-06-05 DIAGNOSIS — Z23 Encounter for immunization: Secondary | ICD-10-CM | POA: Diagnosis not present

## 2015-06-05 DIAGNOSIS — I43 Cardiomyopathy in diseases classified elsewhere: Principal | ICD-10-CM

## 2015-06-05 DIAGNOSIS — E1169 Type 2 diabetes mellitus with other specified complication: Secondary | ICD-10-CM | POA: Diagnosis not present

## 2015-06-05 MED ORDER — POTASSIUM CHLORIDE CRYS ER 20 MEQ PO TBCR: EXTENDED_RELEASE_TABLET | ORAL | Status: DC

## 2015-06-05 MED ORDER — METFORMIN HCL 500 MG PO TABS: ORAL_TABLET | ORAL | Status: DC

## 2015-06-05 NOTE — Progress Notes (Signed)
Date:  06/05/2015   Name:  Alyssa [name]   DOB:  03-02-1941   MRN:  [id number]   Chief Complaint: Diabetes Diabetes She presents for her follow-up diabetic visit. She has type 2 diabetes mellitus. Her disease course has been stable. Pertinent negatives for hypoglycemia include no confusion, dizziness, headaches or tremors. Associated symptoms include weight loss. Pertinent negatives for diabetes include no blurred vision, no chest pain, no fatigue, no foot paresthesias, no polydipsia, no polyuria and no weakness. Symptoms are stable. Her weight is stable. Her lunch blood glucose is taken after 3 pm. Her lunch blood glucose range is generally 140-180 mg/dl. An ACE inhibitor/angiotensin II receptor blocker is being taken. Eye exam is current.   Hypertension and cardiomyopathy - Patient is followed by cardiology. She had a recent visit no medication changes were made. She feels well without significant shortness of breath palpitations or dizziness. She is compliant with her medications. Denies lower extremity edema or orthopnea. Mild dementia - Last visit Seroquel was discontinued. According to the daughter no behavior changes or increased confusion has been noted. The patient complains of decreased appetite but review of weights reveal him to be essentially stable. There's been a mild 5 pound weight loss since a visit 4 months ago. Reviewing her diet she does consume a fair number of sweets and other inappropriate foods. Review of Systems  Constitutional: Positive for weight loss and unexpected weight change (5 lbs over the pat 4 months). Negative for fever, chills and fatigue.  Eyes: Negative for blurred vision.  Respiratory: Negative for cough, choking, chest tightness and shortness of breath.   Cardiovascular: Negative for chest pain, palpitations and leg swelling.  Gastrointestinal: Negative for abdominal pain.  Endocrine: Negative for polydipsia and polyuria.  Genitourinary: Negative for dysuria.   Neurological: Negative for dizziness, tremors, weakness, light-headedness, numbness and headaches.  Hematological: Negative for adenopathy.  Psychiatric/Behavioral: Negative for confusion and agitation.    Patient Active Problem List   Diagnosis Date Noted  . Chronic kidney disease, stage 3 03/07/2015  . Alzheimer's dementia without behavioral disturbance 03/07/2015  . Coronary artery disease involving native coronary artery of native heart without angina pectoris 12/02/2014  . Allergic rhinitis 11/15/2014  . DM type 2 with diabetic mixed hyperlipidemia ([place]) 11/15/2014  . Essential hypertension 11/15/2014  . Arthritis, degenerative 11/15/2014  . Cardiomyopathy due to hypertension ([place]) 11/15/2014  . Hypokalemia 11/15/2014  . H/O gastrointestinal hemorrhage 03/28/2011  . DM type 2, controlled, with complication ([place]) [id number]    Prior to Admission medications   Medication Sig Start Date End Date Taking? Authorizing Provider  amLODipine (NORVASC) 10 MG tablet Take 1 tablet (10 mg total) by mouth daily. 03/07/15  Yes Adline Potter, MD  aspirin EC 81 MG tablet Take 81 mg by mouth daily.   Yes Historical Provider, MD  E-Z Chanetta Marshall Lancets Super Thin MISC  11/02/14  Yes Historical Provider, MD  insulin detemir (LEVEMIR) 100 UNIT/ML injection Inject 25 Units into the skin every morning. 06/06/11  Yes Historical Provider, MD  Insulin Syringe-Needle U-100 (INSULIN SYRINGE .3CC/31GX5/16") 31G X 5/16" 0.3 ML MISC as directed. 05/31/11  Yes Historical Provider, MD  isosorbide mononitrate (IMDUR) 30 MG 24 hr tablet Take 1 tablet (30 mg total) by mouth daily. 03/07/15  Yes Adline Potter, MD  lisinopril (PRINIVIL,ZESTRIL) 40 MG tablet Take 1 tablet (40 mg total) by mouth daily. 03/10/15  Yes Adline Potter, MD  metFORMIN (GLUCOPHAGE) 500 MG tablet TAKE (1) TABLET BY MOUTH TWICE DAILY WITH MEAL  05/09/15  Yes Glean Hess, MD  metoprolol succinate (TOPROL-XL) 50 MG 24 hr tablet Take 1 tablet by mouth daily.  02/20/11  Yes Historical Provider, MD  omeprazole (PRILOSEC) 40 MG capsule TAKE (1) CAPSULE BY MOUTH TWICE DAILY 02/06/15  Yes Glean Hess, MD  potassium chloride SA (K-DUR,KLOR-CON) 20 MEQ tablet Take 1 tablet by mouth daily. 10/08/14  Yes Historical Provider, MD  potassium chloride SA (K-DUR,KLOR-CON) 20 MEQ tablet TAKE (1) TABLET BY MOUTH EVERY DAY 05/09/15  Yes Glean Hess, MD  simvastatin (ZOCOR) 40 MG tablet Take 1 tablet by mouth daily. 10/18/14  Yes Historical Provider, MD  TRUETRACK TEST test strip  01/25/15  Yes Historical Provider, MD  [place] X 1/2" 0.3 ML MISC USE ONCE A DAY FOR INSULIN 04/24/15  Yes Glean Hess, MD    No Known Allergies  Past Surgical History  Procedure Laterality Date  . Abdominal hysterectomy    . Tonsillectomy    . Cholecystectomy      Social History  Substance Use Topics  . Smoking status: Former Smoker -- 0.25 packs/day for 10 years    Types: Cigarettes    Quit date: 09/11/1982  . Smokeless tobacco: None  . Alcohol Use: 0.6 oz/week    1 Cans of beer per week     Medication list has been reviewed and updated.   Physical Exam  Constitutional: She is oriented to person, place, and time. She appears well-developed and well-nourished. No distress.  HENT:  Head: Normocephalic and atraumatic.  Eyes: Conjunctivae are normal. Right eye exhibits no discharge. Left eye exhibits no discharge. No scleral icterus.  Neck: Normal range of motion. Neck supple. No thyromegaly present.  Cardiovascular: Normal rate, regular rhythm and normal heart sounds.   No murmur heard. Pulmonary/Chest: Effort normal and breath sounds normal. No respiratory distress. She has no wheezes. She has no rales.  Abdominal: Soft.  Musculoskeletal: Normal range of motion. She exhibits no edema or tenderness.  Neurological: She is alert and oriented to person, place, and time.  Skin: Skin is warm and dry. No rash noted.  Psychiatric: She has a  normal mood and affect. Her behavior is normal. Thought content normal.  Nursing note and vitals reviewed.   BP 180/90 mmHg  Pulse 80  Ht 5\' 4"  (1.626 m)  Wt 158 lb 12.8 oz (72.031 kg)  BMI 27.24 kg/m2  Assessment and Plan: 1. Cardiomyopathy due to hypertension, without heart failure ([place]) Followed by cardiology - CBC with Differential/Platelet  2. DM type 2 with diabetic mixed hyperlipidemia ([facility]) Continue statin therapy - Comprehensive metabolic panel - Hemoglobin A1c  3. DM type 2, controlled, with complication ([place]) Continue metformin and insulin Monitor appetite and weight as well as improve diet - metFORMIN (GLUCOPHAGE) 500 MG tablet; TAKE (1) TABLET BY MOUTH TWICE DAILY WITH MEAL  Dispense: 60 tablet; Refill: 5  4. Chronic kidney disease, stage 3 Recently stable - Comprehensive metabolic panel  5. Hypokalemia Continue supplementation - potassium chloride SA (K-DUR,KLOR-CON) 20 MEQ tablet; TAKE (1) TABLET BY MOUTH EVERY DAY  Dispense: 30 tablet; Refill: 5  6. Need for influenza vaccination - Flu Vaccine QUAD 36+ mos IM   Halina Maidens, MD [facility]  06/05/2015

## 2015-06-12 NOTE — Progress Notes (Signed)
Patient states she is getting it done at [facility] 06/13/2015

## 2015-06-13 DIAGNOSIS — I119 Hypertensive heart disease without heart failure: Secondary | ICD-10-CM | POA: Diagnosis not present

## 2015-06-13 DIAGNOSIS — E782 Mixed hyperlipidemia: Secondary | ICD-10-CM | POA: Diagnosis not present

## 2015-06-13 DIAGNOSIS — N183 Chronic kidney disease, stage 3 (moderate): Secondary | ICD-10-CM | POA: Diagnosis not present

## 2015-06-13 DIAGNOSIS — E1169 Type 2 diabetes mellitus with other specified complication: Secondary | ICD-10-CM | POA: Diagnosis not present

## 2015-06-14 LAB — COMPREHENSIVE METABOLIC PANEL
ALK PHOS: 57 IU/L (ref 39–117)
ALT: 10 IU/L (ref 0–32)
AST: 22 IU/L (ref 0–40)
Albumin/Globulin Ratio: 1.4 (ref 1.1–2.5)
Albumin: 3.7 g/dL (ref 3.5–4.8)
BILIRUBIN TOTAL: 0.5 mg/dL (ref 0.0–1.2)
BUN / CREAT RATIO: 12 (ref 11–26)
BUN: 14 mg/dL (ref 8–27)
CHLORIDE: 100 mmol/L (ref 97–106)
CO2: 25 mmol/L (ref 18–29)
CREATININE: 1.19 mg/dL — AB (ref 0.57–1.00)
Calcium: 9.2 mg/dL (ref 8.7–10.3)
GFR calc Af Amer: 53 mL/min/{1.73_m2} — ABNORMAL LOW (ref >59)
GFR calc non Af Amer: 46 mL/min/{1.73_m2} — ABNORMAL LOW (ref >59)
GLUCOSE: 144 mg/dL — AB (ref 65–99)
Globulin, Total: 2.6 g/dL (ref 1.5–4.5)
Potassium: 4.8 mmol/L (ref 3.5–5.2)
Sodium: 140 mmol/L (ref 136–144)
Total Protein: 6.3 g/dL (ref 6.0–8.5)

## 2015-06-14 LAB — CBC WITH DIFFERENTIAL/PLATELET
BASOS ABS: 0 10*3/uL (ref 0.0–0.2)
Basos: 0 %
EOS (ABSOLUTE): 0.3 10*3/uL (ref 0.0–0.4)
Eos: 5 %
HEMATOCRIT: 34.2 % (ref 34.0–46.6)
Hemoglobin: 11.3 g/dL (ref 11.1–15.9)
Immature Grans (Abs): 0 10*3/uL (ref 0.0–0.1)
Immature Granulocytes: 0 %
Lymphocytes Absolute: 1.9 10*3/uL (ref 0.7–3.1)
Lymphs: 31 %
MCH: 29.3 pg (ref 26.6–33.0)
MCHC: 33 g/dL (ref 31.5–35.7)
MCV: 89 fL (ref 79–97)
Monocytes Absolute: 0.4 10*3/uL (ref 0.1–0.9)
Monocytes: 7 %
NEUTROS PCT: 57 %
Neutrophils Absolute: 3.6 10*3/uL (ref 1.4–7.0)
Platelets: 225 10*3/uL (ref 150–379)
RBC: 3.86 x10E6/uL (ref 3.77–5.28)
RDW: 14.1 % (ref 12.3–15.4)
WBC: 6.2 10*3/uL (ref 3.4–10.8)

## 2015-06-14 LAB — HEMOGLOBIN A1C
ESTIMATED AVERAGE GLUCOSE: 146 mg/dL
HEMOGLOBIN A1C: 6.7 % — AB (ref 4.8–5.6)

## 2015-07-05 ENCOUNTER — Encounter: Payer: Self-pay | Admitting: Nurse Practitioner

## 2015-07-05 ENCOUNTER — Ambulatory Visit: Payer: Commercial Managed Care - HMO | Admitting: Nurse Practitioner

## 2015-07-05 ENCOUNTER — Encounter: Payer: Self-pay | Admitting: *Deleted

## 2015-07-05 DIAGNOSIS — E1169 Type 2 diabetes mellitus with other specified complication: Secondary | ICD-10-CM | POA: Insufficient documentation

## 2015-07-05 DIAGNOSIS — I119 Hypertensive heart disease without heart failure: Secondary | ICD-10-CM | POA: Insufficient documentation

## 2015-07-05 DIAGNOSIS — R0989 Other specified symptoms and signs involving the circulatory and respiratory systems: Secondary | ICD-10-CM

## 2015-07-05 DIAGNOSIS — N183 Chronic kidney disease, stage 3 unspecified: Secondary | ICD-10-CM | POA: Insufficient documentation

## 2015-07-05 DIAGNOSIS — E119 Type 2 diabetes mellitus without complications: Secondary | ICD-10-CM | POA: Insufficient documentation

## 2015-07-05 DIAGNOSIS — I43 Cardiomyopathy in diseases classified elsewhere: Secondary | ICD-10-CM

## 2015-07-05 DIAGNOSIS — E785 Hyperlipidemia, unspecified: Secondary | ICD-10-CM

## 2015-07-07 ENCOUNTER — Ambulatory Visit: Payer: Commercial Managed Care - HMO | Admitting: Internal Medicine

## 2015-08-01 ENCOUNTER — Other Ambulatory Visit: Payer: Self-pay | Admitting: Internal Medicine

## 2015-08-02 ENCOUNTER — Other Ambulatory Visit: Payer: Self-pay | Admitting: Internal Medicine

## 2015-08-04 NOTE — Telephone Encounter (Signed)
pts coming in on 3/6

## 2015-08-24 ENCOUNTER — Telehealth: Payer: Self-pay | Admitting: Cardiovascular Disease

## 2015-08-24 NOTE — Telephone Encounter (Signed)
3 attempts to schedule appt from recall list.  Patient no show last appt.  Lm with family to call office.  Deleting recall.   08/24/15 [facility]

## 2015-08-29 ENCOUNTER — Other Ambulatory Visit: Payer: Self-pay | Admitting: Internal Medicine

## 2015-09-27 ENCOUNTER — Other Ambulatory Visit: Payer: Self-pay | Admitting: Internal Medicine

## 2015-10-02 ENCOUNTER — Ambulatory Visit: Payer: Commercial Managed Care - HMO | Admitting: Internal Medicine

## 2015-11-09 ENCOUNTER — Other Ambulatory Visit: Payer: Self-pay | Admitting: Internal Medicine

## 2015-11-25 ENCOUNTER — Other Ambulatory Visit: Payer: Self-pay | Admitting: Internal Medicine

## 2015-11-25 MED ORDER — OMEPRAZOLE 40 MG PO CPDR: 40.0000 mg | DELAYED_RELEASE_CAPSULE | Freq: Two times a day (BID) | ORAL | Status: DC

## 2015-12-06 ENCOUNTER — Encounter: Payer: Self-pay | Admitting: Internal Medicine

## 2015-12-06 ENCOUNTER — Ambulatory Visit (INDEPENDENT_AMBULATORY_CARE_PROVIDER_SITE_OTHER): Payer: Commercial Managed Care - HMO | Admitting: Internal Medicine

## 2015-12-06 VITALS — BP 138/84 | HR 73 | Resp 16 | Ht 64.0 in | Wt 150.2 lb

## 2015-12-06 DIAGNOSIS — Z1231 Encounter for screening mammogram for malignant neoplasm of breast: Secondary | ICD-10-CM

## 2015-12-06 DIAGNOSIS — N183 Chronic kidney disease, stage 3 (moderate): Secondary | ICD-10-CM

## 2015-12-06 DIAGNOSIS — Z794 Long term (current) use of insulin: Secondary | ICD-10-CM | POA: Diagnosis not present

## 2015-12-06 DIAGNOSIS — Z0000 Encounter for general adult medical examination without abnormal findings: Secondary | ICD-10-CM

## 2015-12-06 DIAGNOSIS — E1122 Type 2 diabetes mellitus with diabetic chronic kidney disease: Secondary | ICD-10-CM | POA: Diagnosis not present

## 2015-12-06 DIAGNOSIS — I119 Hypertensive heart disease without heart failure: Secondary | ICD-10-CM | POA: Diagnosis not present

## 2015-12-06 DIAGNOSIS — E118 Type 2 diabetes mellitus with unspecified complications: Secondary | ICD-10-CM

## 2015-12-06 DIAGNOSIS — I10 Essential (primary) hypertension: Secondary | ICD-10-CM

## 2015-12-06 DIAGNOSIS — F0390 Unspecified dementia without behavioral disturbance: Secondary | ICD-10-CM | POA: Diagnosis not present

## 2015-12-06 DIAGNOSIS — E876 Hypokalemia: Secondary | ICD-10-CM | POA: Diagnosis not present

## 2015-12-06 DIAGNOSIS — I43 Cardiomyopathy in diseases classified elsewhere: Secondary | ICD-10-CM

## 2015-12-06 DIAGNOSIS — Z8719 Personal history of other diseases of the digestive system: Secondary | ICD-10-CM

## 2015-12-06 LAB — POCT URINALYSIS DIPSTICK
BILIRUBIN UA: NEGATIVE
GLUCOSE UA: NEGATIVE
Ketones, UA: NEGATIVE
Nitrite, UA: NEGATIVE
Protein, UA: 1
RBC UA: 2
UROBILINOGEN UA: 0.2
pH, UA: 6

## 2015-12-06 MED ORDER — METFORMIN HCL 500 MG PO TABS: ORAL_TABLET | ORAL | Status: DC

## 2015-12-06 MED ORDER — TRUETRACK BLOOD GLUCOSE W/DEVICE KIT: 1.0000 | PACK | Freq: Every day | Status: AC

## 2015-12-06 MED ORDER — METOPROLOL SUCCINATE ER 50 MG PO TB24: ORAL_TABLET | ORAL | Status: DC

## 2015-12-06 MED ORDER — DONEPEZIL HCL 5 MG PO TABS: 5.0000 mg | ORAL_TABLET | Freq: Every day | ORAL | Status: DC

## 2015-12-06 MED ORDER — POTASSIUM CHLORIDE CRYS ER 20 MEQ PO TBCR: EXTENDED_RELEASE_TABLET | ORAL | Status: DC

## 2015-12-06 NOTE — Patient Instructions (Signed)
Health Maintenance  Topic Date Due  . OPHTHALMOLOGY EXAM  06/11/1953  . TETANUS/TDAP  06/11/1962  . ZOSTAVAX  06/12/2003  . FOOT EXAM  08/18/2015  . MAMMOGRAM  08/18/2015  . HEMOGLOBIN A1C  12/11/2015  . INFLUENZA VACCINE  02/27/2016  . COLONOSCOPY  07/29/2018  . DEXA SCAN  Completed  . PNA vac Low Risk Adult  Completed   Breast Self-Awareness Practicing breast self-awareness may pick up problems early, prevent significant medical complications, and possibly save your life. By practicing breast self-awareness, you can become familiar with how your breasts look and feel and if your breasts are changing. This allows you to notice changes early. It can also offer you some reassurance that your breast health is good. One way to learn what is normal for your breasts and whether your breasts are changing is to do a breast self-exam. If you find a lump or something that was not present in the past, it is best to contact your caregiver right away. Other findings that should be evaluated by your caregiver include nipple discharge, especially if it is bloody; skin changes or reddening; areas where the skin seems to be pulled in (retracted); or new lumps and bumps. Breast pain is seldom associated with cancer (malignancy), but should also be evaluated by a caregiver. HOW TO PERFORM A BREAST SELF-EXAM The best time to examine your breasts is 5-7 days after your menstrual period is over. During menstruation, the breasts are lumpier, and it may be more difficult to pick up changes. If you do not menstruate, have reached menopause, or had your uterus removed (hysterectomy), you should examine your breasts at regular intervals, such as monthly. If you are breastfeeding, examine your breasts after a feeding or after using a breast pump. Breast implants do not decrease the risk for lumps or tumors, so continue to perform breast self-exams as recommended. Talk to your caregiver about how to determine the difference  between the implant and breast tissue. Also, talk about the amount of pressure you should use during the exam. Over time, you will become more familiar with the variations of your breasts and more comfortable with the exam. A breast self-exam requires you to remove all your clothes above the waist. 1. Look at your breasts and nipples. Stand in front of a mirror in a room with good lighting. With your hands on your hips, push your hands firmly downward. Look for a difference in shape, contour, and size from one breast to the other (asymmetry). Asymmetry includes puckers, dips, or bumps. Also, look for skin changes, such as reddened or scaly areas on the breasts. Look for nipple changes, such as discharge, dimpling, repositioning, or redness. 2. Carefully feel your breasts. This is best done either in the shower or tub while using soapy water or when flat on your back. Place the arm (on the side of the breast you are examining) above your head. Use the pads (not the fingertips) of your three middle fingers on your opposite hand to feel your breasts. Start in the underarm area and use  inch (2 cm) overlapping circles to feel your breast. Use 3 different levels of pressure (light, medium, and firm pressure) at each circle before moving to the next circle. The light pressure is needed to feel the tissue closest to the skin. The medium pressure will help to feel breast tissue a little deeper, while the firm pressure is needed to feel the tissue close to the ribs. Continue the overlapping circles,  moving downward over the breast until you feel your ribs below your breast. Then, move one finger-width towards the center of the body. Continue to use the  inch (2 cm) overlapping circles to feel your breast as you move slowly up toward the collar bone (clavicle) near the base of the neck. Continue the up and down exam using all 3 pressures until you reach the middle of the chest. Do this with each breast, carefully feeling  for lumps or changes. 3.  Keep a written record with breast changes or normal findings for each breast. By writing this information down, you do not need to depend only on memory for size, tenderness, or location. Write down where you are in your menstrual cycle, if you are still menstruating. Breast tissue can have some lumps or thick tissue. However, see your caregiver if you find anything that concerns you.  SEEK MEDICAL CARE IF:  You see a change in shape, contour, or size of your breasts or nipples.   You see skin changes, such as reddened or scaly areas on the breasts or nipples.   You have an unusual discharge from your nipples.   You feel a new lump or unusually thick areas.    This information is not intended to replace advice given to you by your health care provider. Make sure you discuss any questions you have with your health care provider.   Document Released: 07/15/2005 Document Revised: 07/01/2012 Document Reviewed: 10/30/2011 Elsevier Interactive Patient Education [employer].

## 2015-12-06 NOTE — Progress Notes (Signed)
Patient: Alyssa [name], Female    DOB: Mar 19, 1941, 75 y.o.   MRN: [id number] Visit Date: 12/06/2015  Today's Provider: Halina Maidens, MD   Chief Complaint  Patient presents with  . Medicare Wellness   Subjective:    Annual wellness visit Alyssa [name] is a 75 y.o. female who presents today for her Subsequent Annual Wellness Visit. She feels well. She reports exercising none. She reports she is sleeping well.   ----------------------------------------------------------- Diabetes She presents for her follow-up diabetic visit. She has type 2 diabetes mellitus. Hypoglycemia symptoms include headaches. Pertinent negatives for hypoglycemia include no dizziness, nervousness/anxiousness or tremors. Pertinent negatives for diabetes include no chest pain, no fatigue, no polydipsia and no polyuria. She is compliant with treatment most of the time.  Hypertension This is a chronic problem. The current episode started more than 1 year ago. The problem is unchanged. The problem is controlled. Associated symptoms include headaches. Pertinent negatives include no chest pain, palpitations or shortness of breath.  Hyperlipidemia This is a chronic problem. The current episode started more than 1 year ago. Condition status: simvastatin stopped last year in hospital. Pertinent negatives include no chest pain or shortness of breath.  Gastroesophageal Reflux She reports no abdominal pain, no chest pain, no coughing or no wheezing. Pertinent negatives include no fatigue. Risk factors: hx of GI bleed.  Memory Issues - her daughter says that her memory is very poor.  She has not been agitated or wandering at night.  She has noticed decreased appetite and she has lost 4 lbs. Alyssa [name] is alone all day but has several family members checking on her by phone.  There have been no instances of dangerous behavior.  Review of Systems  Constitutional: Negative for fever, chills and fatigue.  HENT: Negative for congestion,  hearing loss, tinnitus, trouble swallowing and voice change.   Eyes: Negative for visual disturbance.  Respiratory: Negative for cough, chest tightness, shortness of breath and wheezing.   Cardiovascular: Negative for chest pain, palpitations and leg swelling.  Gastrointestinal: Negative for vomiting, abdominal pain, diarrhea and constipation.  Endocrine: Negative for polydipsia and polyuria.  Genitourinary: Negative for dysuria, frequency, vaginal bleeding, vaginal discharge and genital sores.  Musculoskeletal: Negative for joint swelling, arthralgias and gait problem.  Skin: Negative for color change and rash.  Neurological: Positive for headaches. Negative for dizziness, tremors, syncope and light-headedness.  Hematological: Negative for adenopathy. Does not bruise/bleed easily.  Psychiatric/Behavioral: Negative for sleep disturbance and dysphoric mood. The patient is not nervous/anxious.     Social History   Social History  . Marital Status: Widowed    Spouse Name: N/A  . Number of Children: N/A  . Years of Education: N/A   Occupational History  . Not on file.   Social History Main Topics  . Smoking status: Former Smoker -- 0.25 packs/day for 10 years    Types: Cigarettes    Quit date: 09/11/1982  . Smokeless tobacco: Not on file  . Alcohol Use: 0.6 oz/week    1 Cans of beer per week  . Drug Use: No  . Sexual Activity: Not on file   Other Topics Concern  . Not on file   Social History Narrative    Patient Active Problem List   Diagnosis Date Noted  . Type 2 diabetes mellitus with stage 3 chronic kidney disease, with long-term current use of insulin ([place]) 12/06/2015  . Hyperlipidemia associated with type 2 diabetes mellitus ([place])   . Alzheimer's dementia without behavioral disturbance  03/07/2015  . Coronary artery disease involving native coronary artery of native heart without angina pectoris 12/02/2014  . Allergic rhinitis 11/15/2014  . Essential hypertension  11/15/2014  . Arthritis, degenerative 11/15/2014  . Cardiomyopathy due to hypertension ([place]) 11/15/2014  . Hypokalemia 11/15/2014  . H/O gastrointestinal hemorrhage 03/28/2011    Past Surgical History  Procedure Laterality Date  . Abdominal hysterectomy    . Tonsillectomy    . Cholecystectomy      Her family history includes Diabetes in her mother; Hypertension in her mother.    Previous Medications   AMLODIPINE (NORVASC) 10 MG TABLET    Take 1 tablet (10 mg total) by mouth daily.   ASPIRIN EC 81 MG TABLET    Take 81 mg by mouth daily.   E-Z JECT LANCETS SUPER THIN MISC       INSULIN SYRINGE-NEEDLE U-100 (INSULIN SYRINGE .3CC/31GX5/16") 31G X 5/16" 0.3 ML MISC    as directed.   ISOSORBIDE MONONITRATE (IMDUR) 30 MG 24 HR TABLET    Take 1 tablet (30 mg total) by mouth daily.   LEVEMIR 100 UNIT/ML INJECTION    INJECT 25 UNITS SUBCUTANEOUSLY EVERY NIGHT.   LISINOPRIL (PRINIVIL,ZESTRIL) 40 MG TABLET    TAKE ONE (1) TABLET BY MOUTH ONCE DAILY   METFORMIN (GLUCOPHAGE) 500 MG TABLET    TAKE (1) TABLET BY MOUTH TWICE DAILY WITH MEAL   METOPROLOL SUCCINATE (TOPROL-XL) 50 MG 24 HR TABLET    TAKE (1) TABLET BY MOUTH EVERY DAY   OMEPRAZOLE (PRILOSEC) 40 MG CAPSULE    Take 1 capsule (40 mg total) by mouth 2 (two) times daily.   POTASSIUM CHLORIDE SA (K-DUR,KLOR-CON) 20 MEQ TABLET    TAKE (1) TABLET BY MOUTH EVERY DAY   TRUETRACK TEST TEST STRIP    Reported on 12/06/2015   ULTICARE INSULIN SYRINGE 30G X 1/2" 0.3 ML MISC    USE ONCE A DAY FOR INSULIN    Patient Care Team: Glean Hess, MD as PCP - General (Family Medicine)     Objective:   Vitals: BP 138/84 mmHg  Pulse 73  Resp 16  SpO2 99%  Physical Exam  Constitutional: She is oriented to person, place, and time. She appears well-developed and well-nourished. No distress.  HENT:  Head: Normocephalic and atraumatic.  Right Ear: Tympanic membrane and ear canal normal.  Left Ear: Tympanic membrane and ear canal normal.  Nose: Right  sinus exhibits no maxillary sinus tenderness. Left sinus exhibits no maxillary sinus tenderness.  Mouth/Throat: Uvula is midline and oropharynx is clear and moist.  Eyes: Conjunctivae and EOM are normal. Right eye exhibits no discharge. Left eye exhibits no discharge. No scleral icterus.  Neck: Normal range of motion. Carotid bruit is not present. No erythema present. No thyromegaly present.  Cardiovascular: Normal rate, regular rhythm, normal heart sounds and normal pulses.   Pulmonary/Chest: Effort normal. No respiratory distress. She has no wheezes. Right breast exhibits no mass, no nipple discharge, no skin change and no tenderness. Left breast exhibits no mass, no nipple discharge, no skin change and no tenderness.  Abdominal: Soft. Bowel sounds are normal. There is no hepatosplenomegaly. There is no tenderness. There is no CVA tenderness.  Musculoskeletal: Normal range of motion. She exhibits no edema or tenderness.  Lymphadenopathy:    She has no cervical adenopathy.    She has no axillary adenopathy.  Neurological: She is alert and oriented to person, place, and time. She has normal reflexes. No cranial nerve deficit or sensory  deficit.  Skin: Skin is warm, dry and intact. No rash noted.  Foot exam - normal sensation, pulses, skin and nails  Psychiatric: She has a normal mood and affect. Her speech is normal and behavior is normal. Cognition and memory are impaired. She exhibits abnormal recent memory and abnormal remote memory.  Nursing note and vitals reviewed.   Activities of Daily Living In your present state of health, do you have any difficulty performing the following activities: 12/06/2015 03/07/2015  Hearing? N N  Vision? N N  Difficulty concentrating or making decisions? [name]  Walking or climbing stairs? Y N  Dressing or bathing? N N  Doing errands, shopping? Y N  Preparing Food and eating ? N -  Using the Toilet? N -  In the past six months, have you accidently leaked  urine? Y -  Do you have problems with loss of bowel control? Y -  Managing your Medications? Y -  Managing your Finances? Y -  Housekeeping or managing your Housekeeping? Y -    Fall Risk Assessment Fall Risk  12/06/2015 03/07/2015  Falls in the past year? No No      Depression Screen PHQ 2/9 Scores 12/06/2015 03/07/2015  PHQ - 2 Score 1 0    Cognitive Testing - 6-CIT   Correct? Score   What year is it? no 0 Yes = 0    No = 4  What month is it? no 0 Yes = 0    No = 3  Remember:     Pia Mau, [address], [place]     What time is it? no 0 Yes = 0    No = 3  Count backwards from 20 to 1 no 0 Correct = 0    1 error = 2   More than 1 error = 4  Say the months of the year in reverse. no 0 Correct = 0    1 error = 2   More than 1 error = 4  What address did I ask you to remember? no 0 Correct = 0  1 error = 2    2 error = 4    3 error = 6    4 error = 8    All wrong = 10       TOTAL SCORE  0/28   Interpretation:  Abnormal- 28  Normal (0-7) Abnormal (8-28)     Medicare Annual Wellness Visit Summary:  Reviewed patient's Family Medical History Reviewed and updated list of patient's medical providers Assessment of cognitive impairment was done Assessed patient's functional ability Established a written schedule for health screening [place] Completed and Reviewed  Exercise Activities and Dietary recommendations Goals    None      Immunization History  Administered Date(s) Administered  . Influenza,inj,Quad PF,36+ Mos 06/05/2015  . Pneumococcal Conjugate-13 08/17/2014  . Pneumococcal Polysaccharide-23 07/30/2011    Health Maintenance  Topic Date Due  . OPHTHALMOLOGY EXAM  06/11/1953  . TETANUS/TDAP  06/11/1962  . ZOSTAVAX  06/12/2003  . FOOT EXAM  08/18/2015  . MAMMOGRAM  08/18/2015  . HEMOGLOBIN A1C  12/11/2015  . INFLUENZA VACCINE  02/27/2016  . COLONOSCOPY  07/29/2018  . DEXA SCAN  Completed  . PNA vac Low Risk Adult  Completed       Discussed health benefits of physical activity, and encouraged her to engage in regular exercise appropriate for her age and condition.    ------------------------------------------------------------------------------------------------------------  Assessment & Plan:  1. Medicare annual wellness visit, subsequent Measures satisfied Concern over dementia, medication management, appetite and weight loss - will refer to [facility] for community resources  2. Cardiomyopathy due to hypertension, without heart failure (HCC) stable - CBC with Differential/Platelet - metoprolol succinate (TOPROL-XL) 50 MG 24 hr tablet; TAKE (1) TABLET BY MOUTH EVERY DAY  Dispense: 30 tablet; Refill: 5 - AMB Referral to [facility]  3. Essential hypertension controlled - Comprehensive metabolic panel - TSH  4. Type 2 diabetes mellitus with stage 3 chronic kidney disease, with long-term current use of insulin (HCC) Continue current therapy; adjust insulin if needed - Hemoglobin A1c - Lipid panel - Microalbumin / creatinine urine ratio - POCT urinalysis dipstick - AMB Referral to [facility]  5. H/O gastrointestinal hemorrhage On PPI without evidence of bleeding - CBC with Differential/Platelet  6. Hypokalemia Continue supplementation - potassium chloride SA (K-DUR,KLOR-CON) 20 MEQ tablet; TAKE (1) TABLET BY MOUTH EVERY DAY  Dispense: 30 tablet; Refill: 5  7. DM type 2, controlled, with complication (HCC) - metFORMIN (GLUCOPHAGE) 500 MG tablet; TAKE (1) TABLET BY MOUTH TWICE DAILY WITH MEAL  Dispense: 60 tablet; Refill: 5 - Blood Glucose Monitoring Suppl (TRUETRACK BLOOD GLUCOSE) w/Device KIT; 1 each by Does not apply route daily.  Dispense: 1 each; Refill: 0  8. Encounter for screening mammogram for breast cancer - MM DIGITAL SCREENING BILATERAL; Future  9. Dementia, without behavioral disturbance Pt was unable/unwilling to answer any of the 6-CIT Discussed various etiologies of  dementia and agreed to try Aricept - donepezil (ARICEPT) 5 MG tablet; Take 1 tablet (5 mg total) by mouth at bedtime.  Dispense: 30 tablet; Refill: 5 - AMB Referral to [place], MD [facility]  12/06/2015

## 2015-12-07 ENCOUNTER — Other Ambulatory Visit: Payer: Self-pay | Admitting: *Deleted

## 2015-12-07 DIAGNOSIS — F0391 Unspecified dementia with behavioral disturbance: Secondary | ICD-10-CM

## 2015-12-07 DIAGNOSIS — F03918 Unspecified dementia, unspecified severity, with other behavioral disturbance: Secondary | ICD-10-CM

## 2015-12-07 LAB — LIPID PANEL
Chol/HDL Ratio: 2.7 ratio units (ref 0.0–4.4)
Cholesterol, Total: 164 mg/dL (ref 100–199)
HDL: 61 mg/dL (ref >39)
LDL CALC: 80 mg/dL (ref 0–99)
Triglycerides: 117 mg/dL (ref 0–149)
VLDL CHOLESTEROL CAL: 23 mg/dL (ref 5–40)

## 2015-12-07 LAB — HEMOGLOBIN A1C
ESTIMATED AVERAGE GLUCOSE: 128 mg/dL
Hgb A1c MFr Bld: 6.1 % — ABNORMAL HIGH (ref 4.8–5.6)

## 2015-12-07 LAB — CBC WITH DIFFERENTIAL/PLATELET
BASOS: 0 %
Basophils Absolute: 0 10*3/uL (ref 0.0–0.2)
EOS (ABSOLUTE): 0.2 10*3/uL (ref 0.0–0.4)
EOS: 3 %
HEMATOCRIT: 34.7 % (ref 34.0–46.6)
HEMOGLOBIN: 11.6 g/dL (ref 11.1–15.9)
IMMATURE GRANS (ABS): 0 10*3/uL (ref 0.0–0.1)
IMMATURE GRANULOCYTES: 0 %
Lymphocytes Absolute: 1.7 10*3/uL (ref 0.7–3.1)
Lymphs: 28 %
MCH: 30.2 pg (ref 26.6–33.0)
MCHC: 33.4 g/dL (ref 31.5–35.7)
MCV: 90 fL (ref 79–97)
MONOCYTES: 8 %
Monocytes Absolute: 0.5 10*3/uL (ref 0.1–0.9)
Neutrophils Absolute: 3.7 10*3/uL (ref 1.4–7.0)
Neutrophils: 61 %
Platelets: 264 10*3/uL (ref 150–379)
RBC: 3.84 x10E6/uL (ref 3.77–5.28)
RDW: 13.6 % (ref 12.3–15.4)
WBC: 6.1 10*3/uL (ref 3.4–10.8)

## 2015-12-07 LAB — COMPREHENSIVE METABOLIC PANEL
A/G RATIO: 1.6 (ref 1.2–2.2)
ALBUMIN: 3.9 g/dL (ref 3.5–4.8)
ALT: 12 IU/L (ref 0–32)
AST: 24 IU/L (ref 0–40)
Alkaline Phosphatase: 67 IU/L (ref 39–117)
BUN / CREAT RATIO: 8 — AB (ref 12–28)
BUN: 10 mg/dL (ref 8–27)
Bilirubin Total: 0.5 mg/dL (ref 0.0–1.2)
CALCIUM: 9.2 mg/dL (ref 8.7–10.3)
CO2: 22 mmol/L (ref 18–29)
CREATININE: 1.28 mg/dL — AB (ref 0.57–1.00)
Chloride: 103 mmol/L (ref 96–106)
GFR, EST AFRICAN AMERICAN: 48 mL/min/{1.73_m2} — AB (ref >59)
GFR, EST NON AFRICAN AMERICAN: 42 mL/min/{1.73_m2} — AB (ref >59)
GLOBULIN, TOTAL: 2.4 g/dL (ref 1.5–4.5)
Glucose: 97 mg/dL (ref 65–99)
Potassium: 4.9 mmol/L (ref 3.5–5.2)
SODIUM: 140 mmol/L (ref 134–144)
TOTAL PROTEIN: 6.3 g/dL (ref 6.0–8.5)

## 2015-12-07 LAB — TSH: TSH: 2.46 u[IU]/mL (ref 0.450–4.500)

## 2015-12-07 NOTE — Patient Outreach (Addendum)
[name]) Care Management  12/07/2015  Alyssa [name] 1941-06-01 [id number]  Subjective: Telephone call to patient home number times 2, initial call disconnected, called back, spoke with patient, and HIPAA verified.  Patient states she is doing okay.   Patient gave [facility] verbal authorization to speak with grand-daughter [name], [phone]) and daughter [name], [phone]) regarding healthcare needs as needed.  Discussed [facility] Care Management services and patient in agreement to receive services.  Patient states she saw her primary MD Dr. Halina Maidens on 12/06/15.    Patient in agreement to referral to [facility] for disease management, disease monitoring, care coordination, and community resources.  Patient declined to verbally receive RNCM's contact number, 24 hour Nurse Advice line, or [facility] main phone number, states she does not have anything to write with at this time and will wait for Community RNCM to call her to set up home visit.    Objective: Per chart review: Patient hospitalized 10/27/14 -4/31/16 for accelerated hypertension and elevated troponin.  Patient has a history of: Dementia, Alzheimer's without behavioral disturbance,  Chronic kidney disease stage III,  and Diabetes.  Patient was closed to [facility] on 12/05/15 due to being unable to reach.   Patient has been a no show for cardiology appointments.  Assessment: Received MD referral on 12/06/15.  Referral source: Dr. Halina Maidens.    Referral reason: Dementia and weight loss.   Diagnosis: Diabetes type 2 with CKD stage 3, with long term insulin, Dementia without behavioral disturbance, Cardiomyopathy due to hypertension, without heart failure.      Telephone screening completed and patient will continue to receive [facility] services.   No telephonic RNCM needs at this time.   Plan: RNCM will referral patient to [facility] fordisease management,  disease monitoring, care coordination, and community resources.   Patient has dementia, hypertension, CKD stage 3, and diabetes.   Will ask Community RNCM to coordinate home visit with patient's daughter or grand-daughter per patient's request.     Zanden Colver H. Annia Friendly, BSN, [facility] Telephonic CM Phone: [phone] Fax: [phone]

## 2015-12-21 ENCOUNTER — Other Ambulatory Visit: Payer: Self-pay | Admitting: *Deleted

## 2015-12-21 ENCOUNTER — Encounter: Payer: Self-pay | Admitting: *Deleted

## 2015-12-21 NOTE — Patient Outreach (Signed)
[facility]) Care Management   12/21/2015  Alyssa [name] 22-Jul-1941 [id number]  Alyssa [name] is an 75 y.o. female  Subjective:  Daughter Alyssa [name] reports pt's sugar has not been checked for 2 weeks, pt just got a  New glucometer,need assistance on how to use it.  Daughter reports pt lives alone but either she or Another family member check on her daily.   Daughter reports pt needs an appointment for Eye MD, Primary  Care MD to do referral.  Pt reports eyesight blurry.    Objective:   Filed Vitals:   12/21/15 1029  BP: 160/72  Pulse: 71  Resp: 20    ROS  Physical Exam  Constitutional: She is oriented to person, place, and time. She appears well-developed and well-nourished.  Cardiovascular: Normal rate, regular rhythm and normal heart sounds.   Respiratory: Effort normal and breath sounds normal.  GI: Soft. Bowel sounds are normal.  Musculoskeletal: Normal range of motion.  Neurological: She is alert and oriented to person, place, and time.  Forgetful, hx of Dementia   Skin: Skin is warm and dry.  Psychiatric: She has a normal mood and affect. Her behavior is normal. Judgment and thought content normal.    Encounter Medications:  Reviewed with granddaughter- does pill planner for pt.  Outpatient Encounter Prescriptions as of 12/21/2015  Medication Sig Note  . amLODipine (NORVASC) 10 MG tablet Take 1 tablet (10 mg total) by mouth daily.   Marland Kitchen aspirin EC 81 MG tablet Take 81 mg by mouth daily. Reported on 12/21/2015   . donepezil (ARICEPT) 5 MG tablet Take 1 tablet (5 mg total) by mouth at bedtime.   . [place] Founds Lancets Super Thin MISC  11/15/2014: Received from: External Pharmacy  . glucose blood test strip 1 each by Other route as needed. [employer]iva accu check glucometer   . Insulin Syringe-Needle U-100 (INSULIN SYRINGE .3CC/31GX5/16") 31G X 5/16" 0.3 ML MISC as directed. 11/15/2014: Received from: [place]  . isosorbide mononitrate (IMDUR) 30 MG  24 hr tablet Take 1 tablet (30 mg total) by mouth daily.   Marland Kitchen LEVEMIR 100 UNIT/ML injection INJECT 25 UNITS SUBCUTANEOUSLY EVERY NIGHT.   Marland Kitchen lisinopril (PRINIVIL,ZESTRIL) 40 MG tablet TAKE ONE (1) TABLET BY MOUTH ONCE DAILY   . metFORMIN (GLUCOPHAGE) 500 MG tablet TAKE (1) TABLET BY MOUTH TWICE DAILY WITH MEAL   . metoprolol succinate (TOPROL-XL) 50 MG 24 hr tablet TAKE (1) TABLET BY MOUTH EVERY DAY   . omeprazole (PRILOSEC) 40 MG capsule Take 1 capsule (40 mg total) by mouth 2 (two) times daily.   . potassium chloride SA (K-DUR,KLOR-CON) 20 MEQ tablet TAKE (1) TABLET BY MOUTH EVERY DAY   . ULTICARE INSULIN SYRINGE 30G X 1/2" 0.3 ML MISC USE ONCE A DAY FOR INSULIN   . Blood Glucose Monitoring Suppl (TRUETRACK BLOOD GLUCOSE) w/Device KIT 1 each by Does not apply route daily. (Patient not taking: Reported on 12/21/2015)   . TRUETRACK TEST test strip Reported on 12/21/2015 03/07/2015: Received from: External Pharmacy   No facility-administered encounter medications on file as of 12/21/2015.    Functional Status:   In your present state of health, do you have any difficulty performing the following activities: 12/21/2015 12/06/2015  Hearing? N N  Vision? Y N  Difficulty concentrating or making decisions? Alyssa [name]  Walking or climbing stairs? N Y  Dressing or bathing? N N  Doing errands, shopping? Alyssa [name]  Preparing Food and eating ? N N  Using the Toilet? N N  In the past six months, have you accidently leaked urine? N Y  Do you have problems with loss of bowel control? N Y  Managing your Medications? Y Y  Managing your Finances? Alyssa [name]  Housekeeping or managing your Housekeeping? Alyssa [name]    Fall/Depression Screening:    PHQ 2/9 Scores 12/21/2015 12/06/2015 03/07/2015  PHQ - 2 Score 2 1 0  PHQ- 9 Score 8 - -    Assessment:  Pleasant 75 year old woman, lives by herself, hx of Dementia, family close by, checks on pt                          Daily.   Pt alert, forgetful, lungs clear, no c/o pain.                          DM-  RN CM assisted daughter on use of new glucometer, had  daughter demonstrate- blood                             Sugar result  81.  [facility] calendar provided for pt to  record readings.                         HTN-  BP today 160/72.  Daughter discussed getting a BP machine, start checking.                                    Information on low Na+ diet provided, reviewed.                          Transportation issues- neither daughter or pt drive, have a car.   Information provided on                           [employer], plus discussed calling [employer] about their transportation benefit.   Plan:  Daughter to check pt's sugar daily, record.             Daughter to f/u with [employer] and [employer] about pt's transportation needs            Plan to provide pt with community nurse case management services, next home visit  6/27.            Plan to inform Dr. Army Melia of [facility] involvement- route by in basket  barrier letter, 5/25 home visit.              THN CM Care Plan Problem One        Most Recent Value   Care Plan Problem One  Knowledge deficit related to diabetic diet.    Role Documenting the Problem One  Care Management Coordinator   Care Plan for Problem One  Active   THN Long Term Goal (31-90 days)  Family/pt have a better understanding of diabetic diet in the next 60 days    THN Long Term Goal Start Date  12/21/15   Interventions for Problem One Long Term Goal  Provided daughter with What's on your plate fact sheet, reviewed    THN CM Short Term Goal #1 (0-30 days)  Pt would include protein with all meals in  the next 30 days    THN CM Short Term Goal #1 Start Date  12/21/15   Interventions for Short Term Goal #1  Discussed with daughter and pt importance of including protein with all meals, maintains sugars better, examples provided.     [facility] CM Care Plan Problem Two        Most Recent Value   Care Plan Problem Two  Hx of HTN- elevated during home visit    Role Documenting the Problem Two  Care  Management Coordinator   Care Plan for Problem Two  Active   THN CM Short Term Goal #1 (0-30 days)  Pt would be compliant with Low Na+ diet for the next 30 days    THN CM Short Term Goal #1 Start Date  12/21/15   Interventions for Short Term Goal #2   Provided daughter with information on Low Na+ diet, includes foods allowed/avoid, reading food labels for sodium content.       Zara Chess.   [place] [facility]  [phone]

## 2015-12-22 ENCOUNTER — Encounter: Payer: Self-pay | Admitting: *Deleted

## 2016-01-23 ENCOUNTER — Ambulatory Visit: Payer: Self-pay | Admitting: *Deleted

## 2016-01-23 ENCOUNTER — Other Ambulatory Visit: Payer: Self-pay | Admitting: *Deleted

## 2016-01-23 NOTE — Patient Outreach (Addendum)
Returned phone call to voice message left  earlier today about cancelling  today's home visit for pt- spoke with daughter Audelia Hives  death in family, need to cancel pt's home visit today.    As discussed, home visit rescheduled for 7/6.     Zara Chess.   [facility]  [phone]

## 2016-02-01 ENCOUNTER — Other Ambulatory Visit: Payer: Self-pay | Admitting: *Deleted

## 2016-02-01 NOTE — Patient Outreach (Signed)
[place] [facility]) Care Management   02/01/2016  Alyssa [name] 1941/01/22 [id number]  Alyssa [name] is an 75 y.o. female  Subjective:  Pt reports daughter had to work today.  Pt reports feels better, appetite the same (moderate loss of appetite- eats 50 % of meals).   Pt reports does not have a lot of sweets, diet consists of oatmeal, tuna fish, boiled chicken, rice.    Pt reports granddaughter continues to manage her medications, pill bottles at granddaughter's home .       Objective:   Filed Vitals:   02/01/16 1543  BP: 140/62  Pulse: 62  Resp: 16    ROS  Physical Exam  Constitutional: She appears well-developed and well-nourished.  Cardiovascular: Regular rhythm and normal heart sounds.   Respiratory: Effort normal and breath sounds normal.  GI: Soft.  Musculoskeletal: Normal range of motion.  Neurological: She is alert.  Needed help in remembering part of home address.    Skin: Skin is warm and dry.  Psychiatric: She has a normal mood and affect. Her behavior is normal. Thought content normal.  Forgetful     Encounter Medications:   Outpatient Encounter Prescriptions as of 02/01/2016  Medication Sig Note  . amLODipine (NORVASC) 10 MG tablet Take 1 tablet (10 mg total) by mouth daily.   Marland Kitchen aspirin EC 81 MG tablet Take 81 mg by mouth daily. Reported on 12/21/2015   . Blood Glucose Monitoring Suppl (TRUETRACK BLOOD GLUCOSE) w/Device KIT 1 each by Does not apply route daily. (Patient not taking: Reported on 12/21/2015)   . donepezil (ARICEPT) 5 MG tablet Take 1 tablet (5 mg total) by mouth at bedtime.   . [place]on Founds Lancets Super Thin MISC  11/15/2014: Received from: External Pharmacy  . glucose blood test strip 1 each by Other route as needed. Aviva accu check glucometer   . Insulin Syringe-Needle U-100 (INSULIN SYRINGE .3CC/31GX5/16") 31G X 5/16" 0.3 ML MISC as directed. 11/15/2014: Received from: [facility]  . isosorbide mononitrate (IMDUR) 30 MG 24  hr tablet Take 1 tablet (30 mg total) by mouth daily.   Marland Kitchen LEVEMIR 100 UNIT/ML injection INJECT 25 UNITS SUBCUTANEOUSLY EVERY NIGHT.   Marland Kitchen lisinopril (PRINIVIL,ZESTRIL) 40 MG tablet TAKE ONE (1) TABLET BY MOUTH ONCE DAILY   . metFORMIN (GLUCOPHAGE) 500 MG tablet TAKE (1) TABLET BY MOUTH TWICE DAILY WITH MEAL   . metoprolol succinate (TOPROL-XL) 50 MG 24 hr tablet TAKE (1) TABLET BY MOUTH EVERY DAY   . omeprazole (PRILOSEC) 40 MG capsule Take 1 capsule (40 mg total) by mouth 2 (two) times daily.   . potassium chloride SA (K-DUR,KLOR-CON) 20 MEQ tablet TAKE (1) TABLET BY MOUTH EVERY DAY   . TRUETRACK TEST test strip Reported on 12/21/2015 03/07/2015: Received from: External Pharmacy  . ULTICARE INSULIN SYRINGE 30G X 1/2" 0.3 ML MISC USE ONCE A DAY FOR INSULIN    No facility-administered encounter medications on file as of 02/01/2016.    Functional Status:   In your present state of health, do you have any difficulty performing the following activities: 12/21/2015 12/06/2015  Hearing? N N  Vision? Y N  Difficulty concentrating or making decisions? [name]  Walking or climbing stairs? N Y  Dressing or bathing? N N  Doing errands, shopping? [name]  Preparing Food and eating ? N N  Using the Toilet? N N  In the past six months, have you accidently leaked urine? N Y  Do you have problems  with loss of bowel control? N Y  Managing your Medications? Y Y  Managing your Finances? [name]  Housekeeping or managing your Housekeeping? [name]    Fall/Depression Screening:    PHQ 2/9 Scores 12/21/2015 12/06/2015 03/07/2015  PHQ - 2 Score 2 1 0  PHQ- 9 Score 8 - -    Assessment:  Pleasant 75 year old woman, lives alone, hx of Dementia, very supportive family close by/checks on pt.                         Daughter Alyssa [name] was suppose to be present during home visit, RN CM called daughter  during home                            Visit, daughter  apologized, forgot.   Daughter reports pt's sugars range 100-150.                           HTN-  Pt's BP today 140/62 compared to last home visit on 5/25- 160/72.                                                    Plan:       Plan to continue to provide community nurse case management services, daughter                     to call RN CM tomorrow to discuss scheduling  f/u home visit with pt next month (daughter                  Needs to be present during home visit).              As discussed with daughter, pt to continue to adhere to diabetic,  low Na+ diet.               As discussed with daughter, continue to monitor pt's sugars, record.   THN CM Care Plan Problem One        Most Recent Value   Care Plan Problem One  Knowledge deficit related to diabetic diet    Role Documenting the Problem One  Care Management Coordinator   Care Plan for Problem One  Active   THN Long Term Goal (31-90 days)  Family/pt understanding of what is on a diabetic diet in the next 40 days    THN Long Term Goal Start Date  02/01/16   Interventions for Problem One Long Term Goal  Discussed with pt foods on a diabetic diet, foods to avoid  [ need review with daughter as pt has  dementia, forgets ]   THN CM Short Term Goal #1 (0-30 days)  Pt would continue to adhere to diabetic diet as evidenced by blood sugar results    THN CM Short Term Goal #1 Start Date  02/01/16   [facility] CM Short Term Goal #1 Met Date  -- [not assessed in time frame, h/v cancelled by daughter ]   Interventions for Short Term Goal #1  Reviewed with daughter Alyssa [name] by phone pt's recent sugars- result 100-150.     [facility] CM Care Plan Problem Two        Most Recent  Value   Care Plan Problem Two  Hx of HTN - elevated at 5/25 home visit    Role Documenting the Problem Two  Care Management [place] for Problem Two  Active   THN CM Short Term Goal #1 (0-30 days)  Pt would continue to be compliant with Low Na+ diet in the next 30 days as evidenced by decrease in BP    THN CM Short Term Goal #1 Start Date  02/01/16   THN CM  Short Term Goal #1 Met Date   -- [not assessed in time frame, daughter cancelled June's h/v ]   Interventions for Short Term Goal #2   Reinforced with pt ongoing compliance with a low Na+ diet.      Zara Chess.   [place] Care Management  [phone]

## 2016-02-06 ENCOUNTER — Other Ambulatory Visit: Payer: Self-pay | Admitting: *Deleted

## 2016-02-06 NOTE — Patient Outreach (Signed)
Follow up phone call to daughter Loanne Drilling (on [facility] consent form)- schedule next month's home visit as daughter needs to be present.  As discussed with daughter, plan to follow up with pt 8/4- home visit, daughter to be present.     Zara Chess.   [place] Care Management  [phone]

## 2016-03-01 ENCOUNTER — Other Ambulatory Visit: Payer: Self-pay | Admitting: *Deleted

## 2016-03-01 ENCOUNTER — Ambulatory Visit: Payer: Self-pay | Admitting: *Deleted

## 2016-03-01 NOTE — Patient Outreach (Signed)
Arrived at pt's home for scheduled home visit, knocked on door several times, called home phone- kept ringing, unable to leave a voice message.   Prior to home visit/arrival to pt's home this  RN CM called pt's daughter Loanne Drilling (on consent form) as she needed to be present for home visit, unable to leave a voice message on home phone- mail box full plus called pt's home number- kept on ringing.   Also called pt's granddaughter Ginny Forth (on consent form),HIPAA compliant voice message left with contact name and number.     Plan: if no response to voice message left with granddaughter, plan to call daughter next week.    Zara Chess.   [place] Care Management  [phone]

## 2016-03-02 ENCOUNTER — Other Ambulatory Visit: Payer: Self-pay | Admitting: Internal Medicine

## 2016-03-05 ENCOUNTER — Emergency Department
Admission: EM | Admit: 2016-03-05 | Discharge: 2016-03-05 | Payer: Commercial Managed Care - HMO | Attending: Emergency Medicine | Admitting: Emergency Medicine

## 2016-03-05 ENCOUNTER — Encounter: Payer: Self-pay | Admitting: Emergency Medicine

## 2016-03-05 ENCOUNTER — Ambulatory Visit (HOSPITAL_COMMUNITY)
Admission: AD | Admit: 2016-03-05 | Discharge: 2016-03-05 | Disposition: A | Discharge status: Home / Self Care | Payer: Commercial Managed Care - HMO | Source: Other Acute Inpatient Hospital | Attending: Allergy & Immunology | Admitting: Allergy & Immunology

## 2016-03-05 DIAGNOSIS — Z79899 Other long term (current) drug therapy: Secondary | ICD-10-CM | POA: Insufficient documentation

## 2016-03-05 DIAGNOSIS — K922 Gastrointestinal hemorrhage, unspecified: Secondary | ICD-10-CM | POA: Insufficient documentation

## 2016-03-05 DIAGNOSIS — F17220 Nicotine dependence, chewing tobacco, uncomplicated: Secondary | ICD-10-CM | POA: Diagnosis not present

## 2016-03-05 DIAGNOSIS — D509 Iron deficiency anemia, unspecified: Secondary | ICD-10-CM | POA: Diagnosis not present

## 2016-03-05 DIAGNOSIS — E876 Hypokalemia: Secondary | ICD-10-CM | POA: Insufficient documentation

## 2016-03-05 DIAGNOSIS — Z87891 Personal history of nicotine dependence: Secondary | ICD-10-CM | POA: Diagnosis not present

## 2016-03-05 DIAGNOSIS — R011 Cardiac murmur, unspecified: Secondary | ICD-10-CM | POA: Diagnosis not present

## 2016-03-05 DIAGNOSIS — G309 Alzheimer's disease, unspecified: Secondary | ICD-10-CM | POA: Diagnosis not present

## 2016-03-05 DIAGNOSIS — N183 Chronic kidney disease, stage 3 (moderate): Secondary | ICD-10-CM | POA: Diagnosis not present

## 2016-03-05 DIAGNOSIS — Z794 Long term (current) use of insulin: Secondary | ICD-10-CM | POA: Insufficient documentation

## 2016-03-05 DIAGNOSIS — E119 Type 2 diabetes mellitus without complications: Secondary | ICD-10-CM | POA: Diagnosis not present

## 2016-03-05 DIAGNOSIS — E1122 Type 2 diabetes mellitus with diabetic chronic kidney disease: Secondary | ICD-10-CM | POA: Diagnosis not present

## 2016-03-05 DIAGNOSIS — K219 Gastro-esophageal reflux disease without esophagitis: Secondary | ICD-10-CM | POA: Diagnosis not present

## 2016-03-05 DIAGNOSIS — I130 Hypertensive heart and chronic kidney disease with heart failure and stage 1 through stage 4 chronic kidney disease, or unspecified chronic kidney disease: Secondary | ICD-10-CM | POA: Diagnosis not present

## 2016-03-05 DIAGNOSIS — K5730 Diverticulosis of large intestine without perforation or abscess without bleeding: Secondary | ICD-10-CM | POA: Diagnosis not present

## 2016-03-05 DIAGNOSIS — K625 Hemorrhage of anus and rectum: Secondary | ICD-10-CM | POA: Diagnosis present

## 2016-03-05 DIAGNOSIS — I10 Essential (primary) hypertension: Secondary | ICD-10-CM | POA: Diagnosis not present

## 2016-03-05 DIAGNOSIS — Z8719 Personal history of other diseases of the digestive system: Secondary | ICD-10-CM | POA: Diagnosis not present

## 2016-03-05 DIAGNOSIS — I2510 Atherosclerotic heart disease of native coronary artery without angina pectoris: Secondary | ICD-10-CM | POA: Insufficient documentation

## 2016-03-05 DIAGNOSIS — K5731 Diverticulosis of large intestine without perforation or abscess with bleeding: Secondary | ICD-10-CM | POA: Diagnosis not present

## 2016-03-05 DIAGNOSIS — F0280 Dementia in other diseases classified elsewhere without behavioral disturbance: Secondary | ICD-10-CM | POA: Diagnosis not present

## 2016-03-05 DIAGNOSIS — I5022 Chronic systolic (congestive) heart failure: Secondary | ICD-10-CM | POA: Insufficient documentation

## 2016-03-05 DIAGNOSIS — K921 Melena: Secondary | ICD-10-CM

## 2016-03-05 DIAGNOSIS — Z7984 Long term (current) use of oral hypoglycemic drugs: Secondary | ICD-10-CM | POA: Diagnosis not present

## 2016-03-05 DIAGNOSIS — N179 Acute kidney failure, unspecified: Secondary | ICD-10-CM | POA: Diagnosis not present

## 2016-03-05 LAB — COMPREHENSIVE METABOLIC PANEL
ALBUMIN: 3.3 g/dL — AB (ref 3.5–5.0)
ALK PHOS: 52 U/L (ref 38–126)
ALT: 13 U/L — AB (ref 14–54)
AST: 26 U/L (ref 15–41)
Anion gap: 3 — ABNORMAL LOW (ref 5–15)
BILIRUBIN TOTAL: 0.5 mg/dL (ref 0.3–1.2)
BUN: 13 mg/dL (ref 6–20)
CALCIUM: 8.8 mg/dL — AB (ref 8.9–10.3)
CO2: 27 mmol/L (ref 22–32)
CREATININE: 1.18 mg/dL — AB (ref 0.44–1.00)
Chloride: 107 mmol/L (ref 101–111)
GFR calc Af Amer: 52 mL/min — ABNORMAL LOW (ref >60)
GFR calc non Af Amer: 45 mL/min — ABNORMAL LOW (ref >60)
GLUCOSE: 77 mg/dL (ref 65–99)
Potassium: 4.6 mmol/L (ref 3.5–5.1)
SODIUM: 137 mmol/L (ref 135–145)
TOTAL PROTEIN: 6.3 g/dL — AB (ref 6.5–8.1)

## 2016-03-05 LAB — CBC WITH DIFFERENTIAL/PLATELET
BASOS ABS: 0 10*3/uL (ref 0–0.1)
BASOS PCT: 0 %
EOS ABS: 0.1 10*3/uL (ref 0–0.7)
EOS PCT: 3 %
HCT: 32.3 % — ABNORMAL LOW (ref 35.0–47.0)
Hemoglobin: 11.1 g/dL — ABNORMAL LOW (ref 12.0–16.0)
LYMPHS ABS: 1.2 10*3/uL (ref 1.0–3.6)
Lymphocytes Relative: 26 %
MCH: 31.1 pg (ref 26.0–34.0)
MCHC: 34.4 g/dL (ref 32.0–36.0)
MCV: 90.4 fL (ref 80.0–100.0)
Monocytes Absolute: 0.5 10*3/uL (ref 0.2–0.9)
Monocytes Relative: 10 %
Neutro Abs: 3 10*3/uL (ref 1.4–6.5)
Neutrophils Relative %: 61 %
PLATELETS: 203 10*3/uL (ref 150–440)
RBC: 3.57 MIL/uL — AB (ref 3.80–5.20)
RDW: 13.6 % (ref 11.5–14.5)
WBC: 4.8 10*3/uL (ref 3.6–11.0)

## 2016-03-05 LAB — TYPE AND SCREEN
ABO/RH(D): A POS
ANTIBODY SCREEN: NEGATIVE

## 2016-03-05 LAB — PROTIME-INR
INR: 0.95
PROTHROMBIN TIME: 12.7 s (ref 11.4–15.2)

## 2016-03-05 LAB — TROPONIN I: Troponin I: <0.03 ng/mL (ref <0.03)

## 2016-03-05 MED ORDER — SODIUM CHLORIDE 0.9 % IV SOLN
80.0000 mg | Freq: Once | INTRAVENOUS | Status: AC
  Administered 2016-03-05: 80 mg via INTRAVENOUS
  Filled 2016-03-05: qty 80

## 2016-03-05 MED ORDER — PANTOPRAZOLE SODIUM 40 MG IV SOLR: 40.0000 mg | Freq: Two times a day (BID) | INTRAVENOUS | Status: DC

## 2016-03-05 MED ORDER — SODIUM CHLORIDE 0.9 % IV SOLN
8.0000 mg/h | INTRAVENOUS | Status: DC
  Administered 2016-03-05: 8 mg/h via INTRAVENOUS
  Filled 2016-03-05: qty 80

## 2016-03-05 NOTE — ED Notes (Signed)
Pt up to bathroom, large amount of bright red blood with dark clots noted in toilet.  Pt ambulates well, independently.

## 2016-03-05 NOTE — ED Notes (Signed)
Large bloody stool noted in commode. Patient denies pain at this time.

## 2016-03-05 NOTE — ED Notes (Addendum)
[facility] here to transport patient. Patient signed paper content form to be transfer to [facility].

## 2016-03-05 NOTE — ED Provider Notes (Signed)
[facility] Emergency Department Provider Note ____________________________________________  Time seen: Approximately 8 AM I have reviewed the triage vital signs and the triage nursing note.  HISTORY  Chief Complaint Rectal Bleeding   Historian Limited historian due to Alzheimer's  HPI Alyssa [name] is a 75 y.o. female with a history of kidney disease, and diabetes, and nonischemic cardiomyopathy, presenting here for bloody stools. Patient states that when she went to the bathroom this morning she thought that she had to urinate, but she ended up passing large dark red clots from her rectum. No abdominal pain or cramping. She denies history of GI bleeding, but on her medical history in the chart it reports history of GI bleed.  Patient does not know her medications.  Denies nausea or vomiting. Denies recent illnesses. Denies any chest pain, palpitations or trouble breathing.    Past Medical History:  Diagnosis Date  . Chronic systolic CHF (congestive heart failure) ([place])   . CKD (chronic kidney disease), stage III   . Essential hypertension   . Hyperlipidemia   . Hypertensive Non-ischemic Cardiomyopathy    a. 2010 Echo: EF 55%;  b. 2010 borderline MV w/ apical thinning; c. 2010 Cath: LCX 50p->Med Rx;  d. 09/2014 Echo: EF 35-40% w/ Sev LVH and glob HK;  e. 09/2014 MV: EF 30-44%, small fixed apical defect (breast attenuation), no ischemia.  . Hypokalemia   . Type II diabetes mellitus [facility])     Patient Active Problem List   Diagnosis Date Noted  . Type 2 diabetes mellitus with stage 3 chronic kidney disease, with long-term current use of insulin ([place]) 12/06/2015  . Hyperlipidemia associated with type 2 diabetes mellitus ([place])   . Alzheimer's dementia without behavioral disturbance 03/07/2015  . Coronary artery disease involving native coronary artery of native heart without angina pectoris 12/02/2014  . Allergic rhinitis 11/15/2014  . Essential hypertension  11/15/2014  . Arthritis, degenerative 11/15/2014  . Cardiomyopathy due to hypertension ([place]) 11/15/2014  . Hypokalemia 11/15/2014  . H/O gastrointestinal hemorrhage 03/28/2011    Past Surgical History:  Procedure Laterality Date  . ABDOMINAL HYSTERECTOMY    . CHOLECYSTECTOMY    . TONSILLECTOMY      Prior to Admission medications   Medication Sig Start Date End Date Taking? Authorizing Provider  amLODipine (NORVASC) 10 MG tablet Take 1 tablet (10 mg total) by mouth daily. 03/07/15   Adline Potter, MD  aspirin EC 81 MG tablet Take 81 mg by mouth daily. Reported on 12/21/2015    Historical Provider, MD  Blood Glucose Monitoring Suppl (TRUETRACK BLOOD GLUCOSE) w/Device KIT 1 each by Does not apply route daily. Patient not taking: Reported on 12/21/2015 12/06/15   Glean Hess, MD  donepezil (ARICEPT) 5 MG tablet Take 1 tablet (5 mg total) by mouth at bedtime. 12/06/15   Glean Hess, MD  E-Z Chanetta Marshall Lancets Super Thin MISC  11/02/14   Historical Provider, MD  glucose blood test strip 1 each by Other route as needed. Aviva accu check glucometer    Historical Provider, MD  Insulin Syringe-Needle U-100 (INSULIN SYRINGE .3CC/31GX5/16") 31G X 5/16" 0.3 ML MISC as directed. 05/31/11   Historical Provider, MD  isosorbide mononitrate (IMDUR) 30 MG 24 hr tablet Take 1 tablet (30 mg total) by mouth daily. 03/07/15   Adline Potter, MD  LEVEMIR 100 UNIT/ML injection INJECT 25 UNITS SUBCUTANEOUSLY EVERY NIGHT. 09/27/15   Glean Hess, MD  lisinopril (PRINIVIL,ZESTRIL) 40 MG tablet TAKE (1) TABLET BY MOUTH EVERY  DAY 03/02/16   Glean Hess, MD  metFORMIN (GLUCOPHAGE) 500 MG tablet TAKE (1) TABLET BY MOUTH TWICE DAILY WITH MEAL 12/06/15   Glean Hess, MD  metoprolol succinate (TOPROL-XL) 50 MG 24 hr tablet TAKE (1) TABLET BY MOUTH EVERY DAY 12/06/15   Glean Hess, MD  omeprazole (PRILOSEC) 40 MG capsule Take 1 capsule (40 mg total) by mouth 2 (two) times daily. 11/25/15   Glean Hess, MD   potassium chloride SA (K-DUR,KLOR-CON) 20 MEQ tablet TAKE (1) TABLET BY MOUTH EVERY DAY 12/06/15   Glean Hess, MD  simvastatin (ZOCOR) 40 MG tablet TAKE (1) TABLET BY MOUTH DAILY AT BEDTIME 03/02/16   Glean Hess, MD  TRUETRACK TEST test strip Reported on 12/21/2015 01/25/15   Historical Provider, MD  [place] X 1/2" 0.3 ML MISC USE ONCE A DAY FOR INSULIN 04/24/15   Glean Hess, MD    No Known Allergies  Family History  Problem Relation Age of Onset  . Diabetes Mother   . Hypertension Mother     Social History Social History  Substance Use Topics  . Smoking status: Former Smoker    Packs/day: 0.25    Years: 10.00    Types: Cigarettes    Quit date: 09/11/1982  . Smokeless tobacco: Current User    Types: Chew  . Alcohol use 0.6 oz/week    1 Cans of beer per week    Review of Systems  Constitutional: Negative for fever. Eyes: Negative for visual changes. ENT: Negative for sore throat. Cardiovascular: Negative for chest pain. Respiratory: Negative for shortness of breath. Gastrointestinal: Negative for abdominal pain Genitourinary: Negative for dysuria. Musculoskeletal: Negative for back pain. Skin: Negative for rash. Neurological: Negative for headache. 10 point Review of Systems otherwise negative ____________________________________________   PHYSICAL EXAM:  VITAL SIGNS: ED Triage Vitals  Enc Vitals Group     BP --      Pulse Rate 03/05/16 0748 69     Resp 03/05/16 0748 18     Temp 03/05/16 0748 98.6 F (37 C)     Temp Source 03/05/16 0748 Oral     SpO2 03/05/16 0748 100 %     Weight 03/05/16 0748 150 lb (68 kg)     Height 03/05/16 0748 '5\' 2"'  (1.575 m)     Head Circumference --      Peak Flow --      Pain Score 03/05/16 0749 0     Pain Loc --      Pain Edu? --      Excl. in [place]? --      Constitutional: Alert and Cooperative, but disoriented. Well appearing and in no distress. HEENT   Head: Normocephalic and  atraumatic.      Eyes: Conjunctivae are normal. PERRL. Normal extraocular movements.      Ears:         Nose: No congestion/rhinnorhea.   Mouth/Throat: Mucous membranes are moist.   Neck: No stridor. Cardiovascular/Chest: Normal rate, regular rhythm.  No murmurs, rubs, or gallops. Respiratory: Normal respiratory effort without tachypnea nor retractions. Breath sounds are clear and equal bilaterally. No wheezes/rales/rhonchi. Gastrointestinal: Soft. No distention, no guarding, no rebound. Nontender.    Genitourinary/rectal: Small external hemorrhoid, no thrombosis.  Gross hematochezia on digital rectal exam, nontender rectal exam. Musculoskeletal: Nontender with normal range of motion in all extremities. No joint effusions.  No lower extremity tenderness.  No edema. Neurologic: Cooperative but disoriented to time. Normal speech  and language. No gross or focal neurologic deficits are appreciated. Skin:  Skin is warm, dry and intact. No rash noted. Psychiatric: Mood and affect are normal. Speech and behavior are normal. Patient exhibits appropriate insight and judgment.  ____________________________________________   EKG I, Lisa Roca, MD, the attending physician have personally viewed and interpreted all ECGs.  69 bpm. Normal sinus rhythm. Narrow QRS. Nonspecific ST and T-wave ____________________________________________  LABS (pertinent positives/negatives)  Labs Reviewed  COMPREHENSIVE METABOLIC PANEL  TROPONIN I  CBC WITH DIFFERENTIAL/PLATELET  PROTIME-INR  TYPE AND SCREEN    ____________________________________________  RADIOLOGY All Xrays were viewed by me. Imaging interpreted by Radiologist.  None __________________________________________  PROCEDURES  Procedure(s) performed: None  Critical Care performed: None  ____________________________________________   ED COURSE / ASSESSMENT AND PLAN  Pertinent labs & imaging results that were available during my  care of the patient were reviewed by me and considered in my medical decision making (see chart for details).   Ms. Pytel is presenting with hematochezia. She has stable vital signs on arrival, but confirmed on rectal exam. Although she states she's not had GI hemorrhage in the past, her chart indicates otherwise. She does not appear to be on any  NSAIDs at this point in time.  Her most recent PCP appointment in our computerized system with Dr. Army Melia, 12/06/15 indicates the patient is on 81 mg aspirin and that she has had prior GI hemorrhage -- but I cannot easily find if this was upper or lower GI bleed.  I will place on protonix bolus and drip.    No hypotension or tachycardia during ED stay.  She did have an additional episode of dark blood clots per rectum in the ED.  No GI coverage at [facility], will need transfer for admission where has GI coverage as patient will likely need procedure.  Type and screened and consented for blood if she should need it.    CONSULTATIONS:  Transfer to [facility], Dr. Waldron Session.   Patient / Family / Caregiver informed of clinical course, medical decision-making process, and agree with plan.   ___________________________________________   FINAL CLINICAL IMPRESSION(S) / ED DIAGNOSES   Final diagnoses:  Hematochezia              Note: This dictation was prepared with Dragon dictation. Any transcriptional errors that result from this process are unintentional    Lisa Roca, MD 03/07/16 1103

## 2016-03-05 NOTE — ED Notes (Signed)
Patient had another BM. Bight red blood noted.

## 2016-03-05 NOTE — ED Triage Notes (Signed)
Pt to ed via ems from home with c/o bloody stools x 3 yesterday and then 2 episodes of dark colored bloody stools today.  Pt denies n/v.  Denies ABD pain.  Pt denies hx of GI Bleeds.  Pt alert and oriented at this time. Hx of dementia, HTN, and diabetes. Pt skin warm and dry.  Placed on cardiac monitor, ekg done and shown to dr Reita Cliche,  Iv started and labs drawn and sent.

## 2016-03-05 NOTE — ED Notes (Signed)
Mitzi Hansen from [facility] called and notified that [facility] was here and ready to transport patient. Also updated on BM and orientation.

## 2016-03-05 NOTE — ED Notes (Signed)
Report given to [place] at [facility]. ETA 15 minutes.

## 2016-03-11 ENCOUNTER — Other Ambulatory Visit: Payer: Self-pay | Admitting: *Deleted

## 2016-03-11 NOTE — Patient Outreach (Signed)
9:48 am -Attempt made to contact granddaughter Ginny Forth (on consent form), follow up on pt's ED admission 8/8 for rectal bleeding, then being transferred to [facility] the same day.   HIPAA compliant voice message left with contact number.    9:57 am- attempt made to contact pt's daughter [name] (on consent form), message on phone- mailbox full, cannot accept messages at this time.    9:58 am- Received a call from pt's daughter [name]  HIPAA verified on pt.  Daughter reports pt was discharged 8/11, doing better, BP better.  Daughter requested RN CM to  call granddaughter Randell Patient) as she has pt's discharge papers, can better explain pt's recent hospitalization to which RN CM reports did leave a voice message with Randell Patient earlier today.  Daughter reports pt is to f/u with Primary Care MD 8/17, home health was ordered but have not heard from them yet.  Daughter reports she will call the community transportation for pt- schedule ride for pt for MD visit 8/17.  Daughter reports granddaughter still manages pt's pill planner, pt has all of her medications, taking them as ordered.    Plan:  As discussed with daughter, plan to do a home visit this week with daughter present (pt has hx of Dementia).           Plan to follow up with granddaughter again this week telephonically  if do not receive a response to voice message left             Today- review pt's  discharge papers (discharge instructions,medications).    Zara Chess.   [place] Care Management  [phone]

## 2016-03-13 ENCOUNTER — Other Ambulatory Visit: Payer: Self-pay | Admitting: *Deleted

## 2016-03-13 ENCOUNTER — Ambulatory Visit: Payer: Self-pay | Admitting: *Deleted

## 2016-03-13 NOTE — Patient Outreach (Signed)
Another attempt made to follow up with pt's granddaughter Ginny Forth (on consent form), review pt's discharge medications/instructions( recent in patient stay at [facility] 8/8-8/11 rectal bleeding),granddaughter manages pt's medications.   HIPAA compliant voice message left with contact name and number.    Plan: if no response, plan to follow up with pt 8/18- home visit, daughter [name] (on consent form) to be present during home visit.     Zara Chess.   [place] Care Management  [phone]

## 2016-03-15 ENCOUNTER — Other Ambulatory Visit: Payer: Self-pay | Admitting: *Deleted

## 2016-03-15 ENCOUNTER — Encounter: Payer: Self-pay | Admitting: *Deleted

## 2016-03-15 NOTE — Patient Outreach (Signed)
Follow up phone call to daughter successful, related to pt's elevated BP obtained earlier at home visit.   Spoke with daughter as she was  Suppose to have pt's BP rechecked,  reports waiting on someone to take her/pt to the pharmacy.   HIPAA verified on pt by daughter.   Daughter reports pt is doing good, will call RN CM with results.      Plan:  As discussed with daughter, if BP obtained on pt after RN CM  hours and elevated more,  to call MD or take pt to ED.    Zara Chess.   [place] Care Management  [phone]

## 2016-03-15 NOTE — Patient Outreach (Addendum)
[facility]) Care Management   03/15/2016  Alyssa [name] April 25, 1941 [id number]  DANASHA [name] is an 75 y.o. female  Subjective:  Pt reports feels good, stronger.  Daughter [name] reports pt's recent hospitalization  Was result of polyps busted, causing rectal bleeding.  Daughter reports colonoscopy was done On pt, no cancer found.   Daughter reports pt is eating better, forgot pt's  blood sugar readings,  Sugars range in the 100's, no low readings.   RN CM called granddaughter Randell Patient (manages Pt's medications) during home visit, granddaughter reported on pt's discharge summary- pt  Was told to eat more fiber.   Granddaughter also reports pt's Alzheimer is becoming more of an  Issue, been trying to  Follow up with pt's Medicaid worker to get pt an aide, not returning her  Calls.   Objective:   Vitals:   03/15/16 1026 03/15/16 1105  BP: (!) 172/82 (!) 190/92  Resp: 16     ROS  Physical Exam  Constitutional: She appears well-developed and well-nourished.  Cardiovascular: Normal rate, regular rhythm and normal heart sounds.   Respiratory: Effort normal and breath sounds normal.  GI: Soft. Bowel sounds are normal.  Musculoskeletal: Normal range of motion. She exhibits no edema.  Neurological: She is alert.  Hx of Alzheimer disease.   Skin: Skin is warm and dry.  Psychiatric: She has a normal mood and affect. Her behavior is normal.    Encounter Medications:  Outpatient Encounter Prescriptions as of 03/15/2016  Medication Sig  . amLODipine (NORVASC) 10 MG tablet Take 1 tablet (10 mg total) by mouth daily.  Marland Kitchen aspirin EC 81 MG tablet Take 81 mg by mouth daily.  Marland Kitchen donepezil (ARICEPT) 5 MG tablet Take 1 tablet (5 mg total) by mouth at bedtime.  . [place] Founds Lancets Super Thin MISC   . glucose blood test strip 1 each by Other route as needed. Aviva accu check glucometer  . Insulin Syringe-Needle U-100 (INSULIN SYRINGE .3CC/31GX5/16") 31G X 5/16" 0.3 ML MISC as  directed.  Marland Kitchen LEVEMIR 100 UNIT/ML injection INJECT 25 UNITS SUBCUTANEOUSLY EVERY NIGHT.  Marland Kitchen lisinopril (PRINIVIL,ZESTRIL) 40 MG tablet TAKE (1) TABLET BY MOUTH EVERY DAY  . metFORMIN (GLUCOPHAGE) 500 MG tablet TAKE (1) TABLET BY MOUTH TWICE DAILY WITH MEAL  . metoprolol succinate (TOPROL-XL) 50 MG 24 hr tablet TAKE (1) TABLET BY MOUTH EVERY DAY  . omeprazole (PRILOSEC) 40 MG capsule Take 1 capsule (40 mg total) by mouth 2 (two) times daily.  . potassium chloride SA (K-DUR,KLOR-CON) 20 MEQ tablet TAKE (1) TABLET BY MOUTH EVERY DAY  . simvastatin (ZOCOR) 40 MG tablet TAKE (1) TABLET BY MOUTH DAILY AT BEDTIME  . Blood Glucose Monitoring Suppl (TRUETRACK BLOOD GLUCOSE) w/Device KIT 1 each by Does not apply route daily. (Patient not taking: Reported on 12/21/2015)  . isosorbide mononitrate (IMDUR) 30 MG 24 hr tablet Take 1 tablet (30 mg total) by mouth daily. (Patient not taking: Reported on 03/15/2016)  . TRUETRACK TEST test strip Reported on 12/21/2015  . ULTICARE INSULIN SYRINGE 30G X 1/2" 0.3 ML MISC USE ONCE A DAY FOR INSULIN   No facility-administered encounter medications on file as of 03/15/2016.     Functional Status:   In your present state of health, do you have any difficulty performing the following activities: 12/21/2015 12/06/2015  Hearing? N N  Vision? Y N  Difficulty concentrating or making decisions? [name]  Walking or climbing stairs? N Y  Dressing or bathing? N N  Doing errands, shopping? [name]  Preparing Food and eating ? N N  Using the Toilet? N N  In the past six months, have you accidently leaked urine? N Y  Do you have problems with loss of bowel control? N Y  Managing your Medications? Y Y  Managing your Finances? [name]  Housekeeping or managing your Housekeeping? [name]  Some recent data might be hidden    Fall/Depression Screening:    PHQ 2/9 Scores 12/21/2015 12/06/2015 03/07/2015  PHQ - 2 Score 2 1 0  PHQ- 9 Score 8 - -    Assessment:   Pleasant 75 year old woman, hx of  Alzheimer, very supportive family- someone stays  With pt at night, most of the day.   Per granddaughter, pt's Alzheimer progressing.  Lungs clear, no  C/o pain, sob, chest pain.                           DM- per daughter, sugars range in 100's.                          HTN- today's BP 172/82 -  Daughter informed RN CM  pt had 1/2 can of chicken noodle                             Soup this am, no water added.   Pt given water and vinegar during home visit by daughter,                             Recheck of BP 190/92.  Daughter also reports pt had fried chicken.    Information on Low Na+                             Diet provided to daughter/reviewed (foods allowed, foods to avoid - especially canned soups).   Plan: As discussed with daughter Shauna Hugh, to have pt's BP rechecked today at local pharmacy, call RN CM with results.            Daughter to look into getting pt a BP machine, start checking and recording, call MD if elevated.            Plan to continue to follow pt for transition of care, call daughter Shauna Hugh next week telephonically.             Plan to send  Dr. Army Melia  By in basket quarterly update letter as well as today's home visit encounter.   [facility] CM Care Plan Problem Three   Flowsheet Row Most Recent Value  Care Plan Problem Three  Risk for readmission related to recent hospitalization - rectal bleeding   Role Documenting the Problem Three  Care Management Coordinator  Care Plan for Problem Three  Active  [facility] Long Term Goal (31-90) days  Pt would not readmit into the hospital in the next 31 days   [facility] Long Term Goal Start Date  03/11/16  Interventions for Problem Three Long Term Goal  home visit done with pt's daughter present   [facility] CM Short Term Goal #1 (0-30 days)  (P) Pt would keep post discharge f/u appointment with Primary care MD within next 5 days, have no issues with transportation   [facility] CM Short Term Goal #  1 Start Date  (P) 03/11/16  [facility] CM Short Term Goal #2 (0-30 days)   (P) Pt would  adhere to Low Na+ diet within the next 30 days   [facility] CM Short Term Goal #2 Start Date  (P) 03/15/16  Interventions for Short Term Goal #2  (P) Provided daughter with information on Low Na+ diet, reveiwed with both daughter and pt avoid canned soups.      Zara Chess.   [place] Care Management  [phone]

## 2016-03-19 ENCOUNTER — Other Ambulatory Visit: Payer: Self-pay | Admitting: *Deleted

## 2016-03-19 NOTE — Patient Outreach (Signed)
Attempt made to contact pt's daughter Loanne Drilling (on consent form), follow up on voice message left 8/21.  Unable to leave a voice message as message on phone - mail box full but was able to have RN CM's number sent to daughter's pager.      Plan: if no response from daughter, plan to call again this week, check on pt's BP.     Zara Chess.   [place] Care Management  [phone]

## 2016-03-22 ENCOUNTER — Other Ambulatory Visit: Payer: Self-pay | Admitting: *Deleted

## 2016-03-22 NOTE — Patient Outreach (Signed)
Transition of care call successful -  follow up on pt's recent hospitalization 8/8-8/11 at [facility] for rectal bleeding.   Spoke with pt's daughter Loanne Drilling (on consent form,pt has hx of Alzheimer),HIPAA verified on pt.  Daughter reports pt's BP came down, checked at the pharmacy- 140's/80's.   Daughter reports she left a voice message on RN CM's phone with the result to which RN CM informed difficult to understand her message.  Daughter apologized- has problems  with phone.   Daughter reports pt is to see Dr. Army Melia 9/11.  Daughter reports she did purchase a BP machine,try to check pt's BP daily,did not do it today-  not feeling well.  Daughter reports pt's  BP range around  140's/80's, pt not eating all that salt now, instead having broiled, baked chicken(removing the skin), cooking fresh foods.  Daughter reports she will call RN CM next week, provide BP readings.    Plan:  RN CM to follow up again with daughter Shauna Hugh telephonically as part of pt's ongoing transition of care.     Zara Chess.   [place] Care Management  [phone]

## 2016-03-29 ENCOUNTER — Ambulatory Visit: Payer: Self-pay | Admitting: *Deleted

## 2016-03-29 ENCOUNTER — Other Ambulatory Visit: Payer: Self-pay | Admitting: *Deleted

## 2016-03-29 NOTE — Patient Outreach (Signed)
4:51 pm- Attempt made to contact pt's daughter Loanne Drilling (on consent form, pt has hx Alzheimer) for pt's ongoing transition of care.  Pt's recent hospitalization 8/8-8/11 at [facility] for rectal bleeding.   Unable to leave a voice message as mail box full.      4:53 pm- received a call from daughter's  phone number dialed earlier- no answer.   Therefore, called back- again unable to leave a voice message as mail box full.    Plan:  RN CM to call pt's daughter again next week as part of ongoing transition of care.     Zara Chess.   [place] Care Management  [phone]

## 2016-04-02 ENCOUNTER — Other Ambulatory Visit: Payer: Self-pay | Admitting: Family Medicine

## 2016-04-02 DIAGNOSIS — I10 Essential (primary) hypertension: Secondary | ICD-10-CM

## 2016-04-04 ENCOUNTER — Other Ambulatory Visit: Payer: Self-pay | Admitting: *Deleted

## 2016-04-04 ENCOUNTER — Ambulatory Visit: Payer: Self-pay | Admitting: *Deleted

## 2016-04-04 NOTE — Patient Outreach (Signed)
Attempt made to contact pt's daughter Loanne Drilling (on consent form, preferred contact- pt has Alzheimer) as part of pt's ongoing transition of  Care.   Person answering the phone reports she is [name]'s sister, Shauna Hugh not available.  Plan:  RN CM to call daughter again within the next 4 days.     Zara Chess.   [place] Care Management  [phone]

## 2016-04-05 ENCOUNTER — Other Ambulatory Visit: Payer: Self-pay | Admitting: *Deleted

## 2016-04-05 NOTE — Patient Outreach (Signed)
Transition of care successful- continue to follow up on pt's recent hospitalization 8/8-8/11 at [facility] for rectal bleeding.  Pt also has hx of DM, hypertension.  Spoke with daughter Alyssa [name] (on consent form, pt has hx of Alzheimer).  Daughter reports pt is doing okay, sugars and BP normal.  Daughter reports pt to go to MD (Dr. Army Melia) 9/14.  Daughter reports dietician has been working with pt.  Plan:  As discussed with pt's daughter, plan to call  again next week (final transition of care).       Alyssa [name].   [place] Care Management  [phone]

## 2016-04-08 ENCOUNTER — Ambulatory Visit: Payer: Commercial Managed Care - HMO | Admitting: Internal Medicine

## 2016-04-11 ENCOUNTER — Ambulatory Visit: Payer: Self-pay | Admitting: *Deleted

## 2016-04-11 ENCOUNTER — Other Ambulatory Visit: Payer: Self-pay | Admitting: *Deleted

## 2016-04-11 ENCOUNTER — Ambulatory Visit (INDEPENDENT_AMBULATORY_CARE_PROVIDER_SITE_OTHER): Payer: Commercial Managed Care - HMO | Admitting: Internal Medicine

## 2016-04-11 ENCOUNTER — Encounter: Payer: Self-pay | Admitting: Internal Medicine

## 2016-04-11 VITALS — BP 138/82 | HR 65 | Resp 16 | Ht 62.0 in | Wt 142.0 lb

## 2016-04-11 DIAGNOSIS — I10 Essential (primary) hypertension: Secondary | ICD-10-CM

## 2016-04-11 DIAGNOSIS — E1122 Type 2 diabetes mellitus with diabetic chronic kidney disease: Secondary | ICD-10-CM | POA: Diagnosis not present

## 2016-04-11 DIAGNOSIS — N183 Chronic kidney disease, stage 3 (moderate): Secondary | ICD-10-CM

## 2016-04-11 DIAGNOSIS — G309 Alzheimer's disease, unspecified: Secondary | ICD-10-CM | POA: Diagnosis not present

## 2016-04-11 DIAGNOSIS — Z23 Encounter for immunization: Secondary | ICD-10-CM

## 2016-04-11 DIAGNOSIS — Z794 Long term (current) use of insulin: Secondary | ICD-10-CM | POA: Diagnosis not present

## 2016-04-11 DIAGNOSIS — I2510 Atherosclerotic heart disease of native coronary artery without angina pectoris: Secondary | ICD-10-CM

## 2016-04-11 DIAGNOSIS — F0280 Dementia in other diseases classified elsewhere without behavioral disturbance: Secondary | ICD-10-CM

## 2016-04-11 NOTE — Patient Outreach (Addendum)
Attempt made to contact pt for final transition of care call, follow up on recent hospitalization at [facility] 8/8-8/11 for rectal bleeding.  Unable to leave a voice message on daughter Cordelia Pen (preferred contact, pt has hx of Alzheimer) phone- mail box full.   View in Epic shows pt followed up with Dr. Army Melia earlier today.    Plan:  RN CM to call pt's daughter Shauna Hugh again next week, check on pt's status.            Update care plan.    Zara Chess.   [place] Care Management  [phone]

## 2016-04-11 NOTE — Progress Notes (Signed)
Date:  04/11/2016   Name:  Alyssa [name]   DOB:  Dec 23, 1940   MRN:  [id number]   Chief Complaint: Hypertension and Diabetes Hypertension  This is a chronic problem. The current episode started more than 1 year ago. The problem is unchanged. The problem is controlled. Pertinent negatives include no chest pain, headaches, palpitations or shortness of breath.  Diabetes  She presents for her follow-up diabetic visit. She has type 2 diabetes mellitus. Her disease course has been stable. Hypoglycemia symptoms include dizziness. Pertinent negatives for hypoglycemia include no headaches or tremors. Pertinent negatives for diabetes include no chest pain, no fatigue, no polydipsia, no polyuria and no weakness. Current diabetic treatment includes insulin injections and oral agent (monotherapy).   Alzheimers - having some more agitation issues.  She does have someone with her around the clock. She continues to take aricept.   CAD - had Myoview last year showing global hypokinesis without ischemia.  She denies chest pain or SOB but activity very limited.  She was supposed to follow up with Dr. Rockey Situ but never did.    Review of Systems  Constitutional: Negative for chills, fatigue and fever.  Eyes: Negative for visual disturbance.  Respiratory: Negative for cough, chest tightness, shortness of breath and wheezing.   Cardiovascular: Negative for chest pain, palpitations and leg swelling.  Gastrointestinal: Negative for abdominal pain, blood in stool and constipation.  Endocrine: Negative for polydipsia and polyuria.  Musculoskeletal: Negative for arthralgias.  Neurological: Positive for dizziness. Negative for tremors, weakness, numbness and headaches.  Hematological: Negative for adenopathy.  Psychiatric/Behavioral: Positive for agitation and decreased concentration. Negative for dysphoric mood and sleep disturbance.    Patient Active Problem List   Diagnosis Date Noted  . Acute GI bleeding  03/05/2016  . Type 2 diabetes mellitus with stage 3 chronic kidney disease, with long-term current use of insulin ([place]) 12/06/2015  . Hyperlipidemia associated with type 2 diabetes mellitus ([place])   . Alzheimer's dementia without behavioral disturbance 03/07/2015  . Coronary artery disease involving native coronary artery of native heart without angina pectoris 12/02/2014  . Allergic rhinitis 11/15/2014  . Essential hypertension 11/15/2014  . Arthritis, degenerative 11/15/2014  . Cardiomyopathy due to hypertension ([place]) 11/15/2014  . Hypokalemia 11/15/2014  . H/O gastrointestinal hemorrhage 03/28/2011    Prior to Admission medications   Medication Sig Start Date End Date Taking? Authorizing Provider  acetaminophen (TYLENOL) 500 MG tablet Take by mouth.   Yes Historical Provider, MD  amLODipine (NORVASC) 10 MG tablet TAKE (1) TABLET BY MOUTH EVERY DAY 04/02/16  Yes Glean Hess, MD  aspirin EC 81 MG tablet Take by mouth.   Yes Historical Provider, MD  Blood Glucose Monitoring Suppl (TRUETRACK BLOOD GLUCOSE) w/Device KIT 1 each by Does not apply route daily. 12/06/15  Yes Glean Hess, MD  donepezil (ARICEPT) 5 MG tablet Take 1 tablet (5 mg total) by mouth at bedtime. 12/06/15  Yes Glean Hess, MD  E-Z Chanetta Marshall Lancets Super Thin MISC  11/02/14  Yes Historical Provider, MD  glucose blood test strip 1 each by Other route as needed. Aviva accu check glucometer   Yes Historical Provider, MD  Insulin Syringe-Needle U-100 (INSULIN SYRINGE .3CC/31GX5/16") 31G X 5/16" 0.3 ML MISC as directed. 05/31/11  Yes Historical Provider, MD  LEVEMIR 100 UNIT/ML injection INJECT 25 UNITS SUBCUTANEOUSLY EVERY NIGHT. 09/27/15  Yes Glean Hess, MD  lisinopril (PRINIVIL,ZESTRIL) 40 MG tablet TAKE (1) TABLET BY MOUTH EVERY DAY 03/02/16  Yes  Glean Hess, MD  metFORMIN (GLUMETZA) 500 MG (MOD) 24 hr tablet Take by mouth.   Yes Historical Provider, MD  metoprolol succinate (TOPROL-XL) 50 MG 24 hr tablet TAKE (1)  TABLET BY MOUTH EVERY DAY 12/06/15  Yes Glean Hess, MD  omeprazole (PRILOSEC) 40 MG capsule Take 1 capsule (40 mg total) by mouth 2 (two) times daily. 11/25/15  Yes Glean Hess, MD  potassium chloride SA (K-DUR,KLOR-CON) 20 MEQ tablet Take 20 mEq by mouth 2 (two) times daily.   Yes Historical Provider, MD  simvastatin (ZOCOR) 10 MG tablet Take by mouth. 03/08/16  Yes Historical Provider, MD  TRUETRACK TEST test strip Reported on 12/21/2015 01/25/15  Yes Historical Provider, MD  [place] X 1/2" 0.3 ML MISC USE ONCE A DAY FOR INSULIN 04/24/15  Yes Glean Hess, MD    No Known Allergies  Past Surgical History:  Procedure Laterality Date  . ABDOMINAL HYSTERECTOMY    . CHOLECYSTECTOMY    . TONSILLECTOMY      Social History  Substance Use Topics  . Smoking status: Former Smoker    Packs/day: 0.25    Years: 10.00    Types: Cigarettes    Quit date: 09/11/1982  . Smokeless tobacco: Current User    Types: Chew  . Alcohol use 0.6 oz/week    1 Cans of beer per week     Medication list has been reviewed and updated.   Physical Exam  Constitutional: She appears well-developed. No distress.  HENT:  Head: Normocephalic and atraumatic.  Neck: Normal range of motion. Neck supple. Carotid bruit is not present.  Cardiovascular: Normal rate, regular rhythm and normal pulses.  Exam reveals gallop and S3.   No murmur heard. Pulmonary/Chest: Effort normal. No respiratory distress.  Abdominal: Soft. Bowel sounds are normal. She exhibits no mass. There is no tenderness. There is no rebound and no guarding.  Musculoskeletal: Normal range of motion.  Neurological: She is alert.  Skin: Skin is warm, dry and intact. No rash noted.  Psychiatric: She has a normal mood and affect. Her speech is normal and behavior is normal. Thought content normal. She exhibits abnormal recent memory.  Nursing note and vitals reviewed.   BP 138/82   Pulse 65   Resp 16   Ht _0  (1.575  m)   Wt 142 lb (64.4 kg)   SpO2 100%   BMI 25.97 kg/m   Assessment and Plan: 1. Essential hypertension controlled  2. Coronary artery disease involving native coronary artery of native heart without angina pectoris Recommend follow up with Dr. Rockey Situ  3. Type 2 diabetes mellitus with stage 3 chronic kidney disease, with long-term current use of insulin (HCC) Continue current therapy - Hemoglobin A1c  4. Alzheimer's dementia without behavioral disturbance Continue supportive family care and Aricept  5. Need for influenza vaccination - Flu Vaccine QUAD 36+ mos IM   Halina Maidens, MD [facility]  04/11/2016

## 2016-04-11 NOTE — Patient Instructions (Addendum)
Call Dr. Rockey Situ - cardiologist [phone];

## 2016-04-12 LAB — HEMOGLOBIN A1C
Est. average glucose Bld gHb Est-mCnc: 94 mg/dL
HEMOGLOBIN A1C: 4.9 % (ref 4.8–5.6)

## 2016-04-17 ENCOUNTER — Other Ambulatory Visit: Payer: Self-pay | Admitting: *Deleted

## 2016-04-17 ENCOUNTER — Ambulatory Visit: Payer: Self-pay | Admitting: *Deleted

## 2016-04-17 NOTE — Patient Outreach (Signed)
Second attempt made to contact daughter Loanne Drilling (on consent) check on pt' status as was following pt for transition of care, transition of care program completed 9/14.    Unable to leave a voice message as mail box is full.   Plan:  RN CM to call daughter again within next 7 days.   Zara Chess.   [place] Care Management  [phone]

## 2016-04-23 ENCOUNTER — Encounter: Payer: Self-pay | Admitting: *Deleted

## 2016-04-23 ENCOUNTER — Other Ambulatory Visit: Payer: Self-pay | Admitting: *Deleted

## 2016-04-23 ENCOUNTER — Ambulatory Visit: Payer: Self-pay | Admitting: *Deleted

## 2016-04-23 NOTE — Patient Outreach (Signed)
Third attempt made to contact pt's daughter Loanne Drilling (on consent form,main contact as pt has hx of Dementia), check on pt's status (BP, blood sugars)as  Transition of care program completed 9/14.  Again, unable  to leave a voice message as mail box full.       Plan:   Unable to contact letter to be sent, if no response in 10 business days, plan to close case and notify Dr. Army Melia.     Zara Chess.   [place] Care Management  [phone]

## 2016-05-15 ENCOUNTER — Other Ambulatory Visit: Payer: Self-pay | Admitting: *Deleted

## 2016-05-15 ENCOUNTER — Encounter: Payer: Self-pay | Admitting: *Deleted

## 2016-05-15 NOTE — Patient Outreach (Signed)
Note-  Case closed due to unable to contact (3 phone attempts made to daughter [name]- preferred contact, unable to contact letter sent to pt/daughter included)- no responses.   Case closure letter sent to Dr. Army Melia.   Plan:  RN CM to inform [facility] care manager assistant to close case.    Zara Chess.   [place] Care Management  [phone]

## 2016-05-29 ENCOUNTER — Other Ambulatory Visit: Payer: Self-pay | Admitting: Internal Medicine

## 2016-06-03 ENCOUNTER — Ambulatory Visit (INDEPENDENT_AMBULATORY_CARE_PROVIDER_SITE_OTHER): Payer: Commercial Managed Care - HMO | Admitting: Cardiovascular Disease

## 2016-06-03 ENCOUNTER — Encounter: Payer: Self-pay | Admitting: Cardiovascular Disease

## 2016-06-03 VITALS — BP 178/72 | HR 70 | Ht 64.0 in | Wt 140.2 lb

## 2016-06-03 DIAGNOSIS — I10 Essential (primary) hypertension: Secondary | ICD-10-CM | POA: Diagnosis not present

## 2016-06-03 DIAGNOSIS — I2510 Atherosclerotic heart disease of native coronary artery without angina pectoris: Secondary | ICD-10-CM

## 2016-06-03 DIAGNOSIS — G309 Alzheimer's disease, unspecified: Secondary | ICD-10-CM

## 2016-06-03 DIAGNOSIS — E1169 Type 2 diabetes mellitus with other specified complication: Secondary | ICD-10-CM | POA: Diagnosis not present

## 2016-06-03 DIAGNOSIS — Z794 Long term (current) use of insulin: Secondary | ICD-10-CM

## 2016-06-03 DIAGNOSIS — F0280 Dementia in other diseases classified elsewhere without behavioral disturbance: Secondary | ICD-10-CM

## 2016-06-03 DIAGNOSIS — I119 Hypertensive heart disease without heart failure: Secondary | ICD-10-CM

## 2016-06-03 DIAGNOSIS — I43 Cardiomyopathy in diseases classified elsewhere: Secondary | ICD-10-CM

## 2016-06-03 DIAGNOSIS — N183 Chronic kidney disease, stage 3 (moderate): Secondary | ICD-10-CM

## 2016-06-03 DIAGNOSIS — E1122 Type 2 diabetes mellitus with diabetic chronic kidney disease: Secondary | ICD-10-CM

## 2016-06-03 DIAGNOSIS — R011 Cardiac murmur, unspecified: Secondary | ICD-10-CM

## 2016-06-03 DIAGNOSIS — E785 Hyperlipidemia, unspecified: Secondary | ICD-10-CM

## 2016-06-03 NOTE — Patient Instructions (Addendum)
Medication Instructions:   No medication changes made Please stay on your medication Measure your blood pressure at home  Please call the office if the top number runs >140 on a frequent basis  Labwork:  No new labs needed  Testing/Procedures:  No further testing at this time   Follow-Up: It was a pleasure seeing you in the office today. Please call us if you have new issues that need to be addressed before your next appt.  [phone]  Your physician wants you to follow-up in: 6 months.  You will receive a reminder letter in the mail two months in advance. If you don't receive a letter, please call our office to schedule the follow-up appointment.  If you need a refill on your cardiac medications before your next appointment, please call your pharmacy.

## 2016-06-03 NOTE — Progress Notes (Signed)
Cardiology Office Note  Date:  06/03/2016   ID:  Alyssa [name], Alyssa [name] 04/11/1941, MRN [id number]  PCP:  Halina Maidens, MD   Chief Complaint  Patient presents with  . other    "Doing well." Meds reviewed by the pt. verbally.     HPI:  75 year old female with history of CAD with cath in 2010 that showed minimal CAD, DM2, HLD, and migraines who was admitted to [facility] October 25 2014 for hypertensive urgency and demand ischemia returned to [facility] on 10/27/14 with uncontrolled HTN with BP greater than 200/100 and elevated troponin  and chest pain. She presents for management of her blood pressure.  On today's visit she presents with a friend On today's visit, she is out of her medications and did not take her medications this morning Blood pressures running high Reports she feels fine, no complaints Denies any significant shortness of breath on exertion, no leg edema  Previous records reviewed Myoview stress test June 2016 No ischemia  EKG on today's visit shows normal sinus rhythm with rate 70 bpm, diffuse T-wave abnormality, LVH  Other past medical history reviewed Previous hospital admission 2016 Echo showed an EF of 35-40% with severe LVH and global hypokinesis likely hypertensive heart disease.   Other past medical history She was admitted to [facility] back in 2010 for chest pain echo at that time time showed EF 55%, no LV thrombus, no wall motion abnormalities, trace AI.  Cardiac enzymes were negative at that time.  She underwent Myoview stress test that showed borderline abnormal Myoview with apical thinning. No evidence of stress-induced myocardial ischemia and ejection fraction of 60%. Medical management recommended.   The patient did not want to go home on the 22nd after the stress test was done, was concerned about her pain and had a cardiac catheterization done by Dr. 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 No intervention was  done.   It was felt she either had costochondritis vs stress induced chest pain.   PMH:   has a past medical history of Chronic systolic CHF (congestive heart failure) ([place]); CKD (chronic kidney disease), stage III; Essential hypertension; Hyperlipidemia; Hypertensive Non-ischemic Cardiomyopathy; Hypokalemia; and Type II diabetes mellitus ([place]).  PSH:    Past Surgical History:  Procedure Laterality Date  . ABDOMINAL HYSTERECTOMY    . CHOLECYSTECTOMY    . TONSILLECTOMY      Current Outpatient Prescriptions  Medication Sig Dispense Refill  . acetaminophen (TYLENOL) 500 MG tablet Take by mouth.    Marland Kitchen amLODipine (NORVASC) 10 MG tablet TAKE (1) TABLET BY MOUTH EVERY DAY 90 tablet 3  . aspirin EC 81 MG tablet Take by mouth.    . Blood Glucose Monitoring Suppl (TRUETRACK BLOOD GLUCOSE) w/Device KIT 1 each by Does not apply route daily. 1 each 0  . donepezil (ARICEPT) 5 MG tablet Take 1 tablet (5 mg total) by mouth at bedtime. 30 tablet 5  . E-Z Ject Lancets Super Thin MISC     . GLOBAL INJECT EASE INSULIN SYR 30G X 1/2" 0.3 ML MISC USE ONCE A DAY FOR INSULIN 100 each 3  . glucose blood test strip 1 each by Other route as needed. Aviva accu check glucometer    . Insulin Syringe-Needle U-100 (INSULIN SYRINGE .3CC/31GX5/16") 31G X 5/16" 0.3 ML MISC as directed.    Marland Kitchen LEVEMIR 100 UNIT/ML injection INJECT 25 UNITS SUBCUTANEOUSLY EVERY NIGHT. 10 mL 12  . lisinopril (PRINIVIL,ZESTRIL) 40 MG tablet  TAKE (1) TABLET BY MOUTH EVERY DAY 30 tablet 5  . metFORMIN (GLUMETZA) 500 MG (MOD) 24 hr tablet Take by mouth.    . metoprolol succinate (TOPROL-XL) 50 MG 24 hr tablet TAKE (1) TABLET BY MOUTH EVERY DAY 30 tablet 5  . omeprazole (PRILOSEC) 40 MG capsule Take 1 capsule (40 mg total) by mouth 2 (two) times daily. 60 capsule 5  . potassium chloride SA (K-DUR,KLOR-CON) 20 MEQ tablet Take 20 mEq by mouth 2 (two) times daily.    . simvastatin (ZOCOR) 10 MG tablet Take by mouth.    Angelia Mould TEST test strip  Reported on 12/21/2015     No current facility-administered medications for this visit.      Allergies:   Patient has no known allergies.   Social History:  The patient  reports that she quit smoking about 33 years ago. Her smoking use included Cigarettes. She has a 2.50 pack-year smoking history. Her smokeless tobacco use includes Chew. She reports that she drinks about 0.6 oz of alcohol per week . She reports that she does not use drugs.   Family History:   family history includes Diabetes in her mother; Hypertension in her mother.    Review of Systems: Review of Systems  Constitutional: Negative.   Respiratory: Negative.   Cardiovascular: Negative.   Gastrointestinal: Negative.   Musculoskeletal: Negative.   Neurological: Negative.   Psychiatric/Behavioral: Negative.   All other systems reviewed and are negative.    PHYSICAL EXAM: VS:  BP (!) 178/72 (BP Location: Left Arm, Patient Position: Sitting, Cuff Size: Normal)   Pulse 70   Ht _0  (1.626 m)   Wt 140 lb 4 oz (63.6 kg)   BMI 24.07 kg/m  , BMI Body mass index is 24.07 kg/m. GEN: Well nourished, well developed, in no acute distress  HEENT: normal  Neck: no JVD, carotid bruits, or masses Cardiac: RRR; 2+ SEM RSB, no rubs, or gallops,no edema  Respiratory:  clear to auscultation bilaterally, normal work of breathing GI: soft, nontender, nondistended, + BS MS: no deformity or atrophy  Skin: warm and dry, no rash Neuro:  Strength and sensation are intact Psych: euthymic mood, full affect    Recent Labs: 12/06/2015: TSH 2.460 03/05/2016: ALT 13; BUN 13; Creatinine, Ser 1.18; Hemoglobin 11.1; Platelets 203; Potassium 4.6; Sodium 137    Lipid Panel Lab Results  Component Value Date   CHOL 164 12/06/2015   HDL 61 12/06/2015   LDLCALC 80 12/06/2015   TRIG 117 12/06/2015      Wt Readings from Last 3 Encounters:  06/03/16 140 lb 4 oz (63.6 kg)  04/11/16 142 lb (64.4 kg)  03/15/16 150 lb (68 kg)        ASSESSMENT AND PLAN:  Essential hypertension - Plan: EKG 12-Lead Blood pressure markedly elevated on today's visit, did not take her medications today Friend who presents with her is going to the pharmacy to pickup her medications Blood pressure with primary care 160 systolic in September 1093 No medication changes made  Cardiomyopathy due to hypertension, without heart failure ([place]) - Plan: EKG 12-Lead Stressed importance of aggressive blood pressure control, Recommended a by blood pressure cuff for monitoring at home, call our office if numbers run high Parameters provided  Coronary artery disease involving native coronary artery of native heart without angina pectoris Currently with no symptoms of angina. No further workup at this time. Continue current medication regimen.  Hyperlipidemia associated with type 2 diabetes mellitus ([place]) Encouraged her  to stay on her simvastatin Goal LDL less than 70  Type 2 diabetes mellitus with stage 3 chronic kidney disease, with long-term current use of insulin ([place]) We have encouraged continued exercise, careful diet management in an effort to lose weight.  Alzheimer's dementia without behavioral disturbance, unspecified timing of dementia onset Friend did most of the talking today  Cardiac murmur Murmur likely secondary to severe LVH secondary to poorly controlled hypertension Suspect some component of outflow tract obstruction   Total encounter time more than 25 minutes  Greater than 50% was spent in counseling and coordination of care with the patient   Disposition:   F/U  6 months   Orders Placed This Encounter  Procedures  . EKG 12-Lead     Signed, Esmond Plants, M.D., Ph.D. 06/03/2016  [place], [place]

## 2016-06-10 ENCOUNTER — Other Ambulatory Visit: Payer: Self-pay | Admitting: Internal Medicine

## 2016-06-10 DIAGNOSIS — E876 Hypokalemia: Secondary | ICD-10-CM

## 2016-06-15 ENCOUNTER — Other Ambulatory Visit: Payer: Self-pay | Admitting: Internal Medicine

## 2016-06-15 DIAGNOSIS — E118 Type 2 diabetes mellitus with unspecified complications: Secondary | ICD-10-CM

## 2016-06-15 DIAGNOSIS — F0390 Unspecified dementia without behavioral disturbance: Secondary | ICD-10-CM

## 2016-06-24 ENCOUNTER — Other Ambulatory Visit: Payer: Self-pay | Admitting: Internal Medicine

## 2016-08-12 ENCOUNTER — Ambulatory Visit: Payer: Commercial Managed Care - HMO | Admitting: Internal Medicine

## 2016-08-12 ENCOUNTER — Other Ambulatory Visit: Payer: Self-pay | Admitting: Internal Medicine

## 2016-08-12 ENCOUNTER — Encounter: Payer: Self-pay | Admitting: Internal Medicine

## 2016-08-22 ENCOUNTER — Other Ambulatory Visit: Payer: Self-pay | Admitting: Internal Medicine

## 2016-08-31 ENCOUNTER — Other Ambulatory Visit: Payer: Self-pay | Admitting: Internal Medicine

## 2016-08-31 DIAGNOSIS — E118 Type 2 diabetes mellitus with unspecified complications: Secondary | ICD-10-CM

## 2016-09-06 ENCOUNTER — Other Ambulatory Visit: Payer: Self-pay | Admitting: Internal Medicine

## 2016-09-11 NOTE — Telephone Encounter (Signed)
Unable to reach pt everytime when called has a busy tone

## 2016-09-16 ENCOUNTER — Other Ambulatory Visit: Payer: Self-pay | Admitting: Internal Medicine

## 2016-09-16 DIAGNOSIS — F0390 Unspecified dementia without behavioral disturbance: Secondary | ICD-10-CM

## 2016-09-23 ENCOUNTER — Ambulatory Visit: Payer: Commercial Managed Care - HMO | Admitting: Internal Medicine

## 2016-09-23 DIAGNOSIS — I119 Hypertensive heart disease without heart failure: Secondary | ICD-10-CM | POA: Insufficient documentation

## 2016-09-23 DIAGNOSIS — I43 Cardiomyopathy in diseases classified elsewhere: Secondary | ICD-10-CM

## 2016-10-05 ENCOUNTER — Emergency Department
Admission: EM | Admit: 2016-10-05 | Discharge: 2016-10-05 | Disposition: A | Discharge status: Home / Self Care | Payer: Medicare HMO | Attending: Emergency Medicine | Admitting: Emergency Medicine

## 2016-10-05 DIAGNOSIS — F17220 Nicotine dependence, chewing tobacco, uncomplicated: Secondary | ICD-10-CM | POA: Diagnosis not present

## 2016-10-05 DIAGNOSIS — N183 Chronic kidney disease, stage 3 (moderate): Secondary | ICD-10-CM | POA: Insufficient documentation

## 2016-10-05 DIAGNOSIS — E1122 Type 2 diabetes mellitus with diabetic chronic kidney disease: Secondary | ICD-10-CM | POA: Insufficient documentation

## 2016-10-05 DIAGNOSIS — G309 Alzheimer's disease, unspecified: Secondary | ICD-10-CM | POA: Insufficient documentation

## 2016-10-05 DIAGNOSIS — Z794 Long term (current) use of insulin: Secondary | ICD-10-CM | POA: Insufficient documentation

## 2016-10-05 DIAGNOSIS — I5022 Chronic systolic (congestive) heart failure: Secondary | ICD-10-CM | POA: Insufficient documentation

## 2016-10-05 DIAGNOSIS — Z7982 Long term (current) use of aspirin: Secondary | ICD-10-CM | POA: Diagnosis not present

## 2016-10-05 DIAGNOSIS — E11649 Type 2 diabetes mellitus with hypoglycemia without coma: Secondary | ICD-10-CM | POA: Diagnosis not present

## 2016-10-05 DIAGNOSIS — N390 Urinary tract infection, site not specified: Secondary | ICD-10-CM | POA: Diagnosis not present

## 2016-10-05 DIAGNOSIS — E162 Hypoglycemia, unspecified: Secondary | ICD-10-CM | POA: Diagnosis not present

## 2016-10-05 DIAGNOSIS — I130 Hypertensive heart and chronic kidney disease with heart failure and stage 1 through stage 4 chronic kidney disease, or unspecified chronic kidney disease: Secondary | ICD-10-CM | POA: Insufficient documentation

## 2016-10-05 LAB — TROPONIN I: Troponin I: <0.03 ng/mL (ref <0.03)

## 2016-10-05 LAB — BASIC METABOLIC PANEL
Anion gap: 10 (ref 5–15)
BUN: 16 mg/dL (ref 6–20)
CALCIUM: 8.5 mg/dL — AB (ref 8.9–10.3)
CO2: 21 mmol/L — AB (ref 22–32)
CREATININE: 1.27 mg/dL — AB (ref 0.44–1.00)
Chloride: 105 mmol/L (ref 101–111)
GFR calc Af Amer: 47 mL/min — ABNORMAL LOW (ref >60)
GFR calc non Af Amer: 41 mL/min — ABNORMAL LOW (ref >60)
GLUCOSE: 310 mg/dL — AB (ref 65–99)
Potassium: 4.2 mmol/L (ref 3.5–5.1)
Sodium: 136 mmol/L (ref 135–145)

## 2016-10-05 LAB — URINALYSIS, COMPLETE (UACMP) WITH MICROSCOPIC
Bilirubin Urine: NEGATIVE
Glucose, UA: 150 mg/dL — AB
Ketones, ur: NEGATIVE mg/dL
Leukocytes, UA: NEGATIVE
Nitrite: NEGATIVE
PH: 6 (ref 5.0–8.0)
Protein, ur: 100 mg/dL — AB
SPECIFIC GRAVITY, URINE: 1.004 — AB (ref 1.005–1.030)

## 2016-10-05 LAB — CBC
HEMATOCRIT: 31.6 % — AB (ref 35.0–47.0)
Hemoglobin: 10.4 g/dL — ABNORMAL LOW (ref 12.0–16.0)
MCH: 29.7 pg (ref 26.0–34.0)
MCHC: 32.8 g/dL (ref 32.0–36.0)
MCV: 90.4 fL (ref 80.0–100.0)
Platelets: 221 10*3/uL (ref 150–440)
RBC: 3.5 MIL/uL — ABNORMAL LOW (ref 3.80–5.20)
RDW: 15 % — AB (ref 11.5–14.5)
WBC: 8.4 10*3/uL (ref 3.6–11.0)

## 2016-10-05 LAB — GLUCOSE, CAPILLARY
GLUCOSE-CAPILLARY: 276 mg/dL — AB (ref 65–99)
GLUCOSE-CAPILLARY: 240 mg/dL — AB (ref 65–99)
Glucose-Capillary: 153 mg/dL — ABNORMAL HIGH (ref 65–99)

## 2016-10-05 MED ORDER — CEPHALEXIN 500 MG PO CAPS: 500.0000 mg | ORAL_CAPSULE | Freq: Two times a day (BID) | ORAL | 0 refills | Status: DC

## 2016-10-05 MED ORDER — DEXTROSE 5 % IV SOLN: 1.0000 g | Freq: Once | INTRAVENOUS | Status: DC

## 2016-10-05 MED ORDER — CEFTRIAXONE SODIUM-DEXTROSE 1-3.74 GM-% IV SOLR
1.0000 g | Freq: Once | INTRAVENOUS | Status: AC
  Administered 2016-10-05: 1 g via INTRAVENOUS
  Filled 2016-10-05: qty 50

## 2016-10-05 NOTE — ED Triage Notes (Signed)
Pt presents via EMS from home. Pt took insulin this am without meal and had reported unresponsive episode and possible seizure per family. CBG 20s per EMS report.

## 2016-10-05 NOTE — ED Notes (Signed)
Patient's family called to come pick her up in the lobby.

## 2016-10-05 NOTE — ED Notes (Signed)
Ambulated to restroom without difficulty.

## 2016-10-05 NOTE — ED Provider Notes (Signed)
[facility] Emergency Department Provider Note ____________________________________________   I have reviewed the triage vital signs and the triage nursing note.  HISTORY  Chief Complaint Hypoglycemia   Historian Patient and family members  HPI Alyssa [name] is a 76 y.o. female lives with a friend, has diabetes and takes insulin (they think in the mornings - although medication lists levemir in the evenings), family states she has some mild dementia and they do set of her medications and drop them off to her but she does take of herself. Reportedly she woke up normally this morning and did not feel like eating breakfast. Later she was confused and minimally responsive and her friend called the family who called EMS who found the patient to be hypoglycemic with a blood sugar in the 20s. After glucose she did wake up. No focal weakness or signs of stroke.  No chest pain or palpitations or trouble breathing. No recent illness in terms of cough or vomiting or diarrhea although she did say that she did have one loose stool.  She is currently eating food.    Past Medical History:  Diagnosis Date  . Chronic systolic CHF (congestive heart failure) ([place])   . CKD (chronic kidney disease), stage III   . Essential hypertension   . Hyperlipidemia   . Hypertensive Non-ischemic Cardiomyopathy    a. 2010 Echo: EF 55%;  b. 2010 borderline MV w/ apical thinning; c. 2010 Cath: LCX 50p->Med Rx;  d. 09/2014 Echo: EF 35-40% w/ Sev LVH and glob HK;  e. 09/2014 MV: EF 30-44%, small fixed apical defect (breast attenuation), no ischemia.  . Hypokalemia   . Type II diabetes mellitus [facility])     Patient Active Problem List   Diagnosis Date Noted  . Cardiomyopathy due to hypertension, without heart failure ([place]) 09/23/2016  . Cardiac murmur 06/03/2016  . Acute GI bleeding 03/05/2016  . Type 2 diabetes mellitus with stage 3 chronic kidney disease, with long-term current use of insulin  ([place]) 12/06/2015  . Hyperlipidemia associated with type 2 diabetes mellitus ([place])   . Alzheimer's dementia without behavioral disturbance 03/07/2015  . Coronary artery disease involving native coronary artery of native heart without angina pectoris 12/02/2014  . Allergic rhinitis 11/15/2014  . Essential hypertension 11/15/2014  . Arthritis, degenerative 11/15/2014  . Hypokalemia 11/15/2014  . H/O gastrointestinal hemorrhage 03/28/2011    Past Surgical History:  Procedure Laterality Date  . ABDOMINAL HYSTERECTOMY    . CHOLECYSTECTOMY    . TONSILLECTOMY      Prior to Admission medications   Medication Sig Start Date End Date Taking? Authorizing Provider  acetaminophen (TYLENOL) 500 MG tablet Take by mouth.    Historical Provider, MD  amLODipine (NORVASC) 10 MG tablet TAKE (1) TABLET BY MOUTH EVERY DAY 04/02/16   Glean Hess, MD  aspirin EC 81 MG tablet Take by mouth.    Historical Provider, MD  Blood Glucose Monitoring Suppl (TRUETRACK BLOOD GLUCOSE) w/Device KIT 1 each by Does not apply route daily. 12/06/15   Glean Hess, MD  cephALEXin (KEFLEX) 500 MG capsule Take 1 capsule (500 mg total) by mouth 2 (two) times daily. 10/05/16   Lisa Roca, MD  donepezil (ARICEPT) 5 MG tablet TAKE ONE TABLET AT BEDTIME. 09/17/16   Glean Hess, MD  E-Z Chanetta Marshall Lancets Super Thin MISC  11/02/14   Historical Provider, MD  GLOBAL INJECT EASE INSULIN SYR 30G X 1/2" 0.3 ML MISC USE ONCE A DAY FOR INSULIN 05/29/16  Glean Hess, MD  glucose blood test strip 1 each by Other route as needed. Aviva accu check glucometer    Historical Provider, MD  Insulin Syringe-Needle U-100 (INSULIN SYRINGE .3CC/31GX5/16") 31G X 5/16" 0.3 ML MISC as directed. 05/31/11   Historical Provider, MD  LEVEMIR 100 UNIT/ML injection INJECT 25 UNITS SUBCUTANEOUSLY EVERY NIGHT. 09/27/15   Glean Hess, MD  lisinopril (PRINIVIL,ZESTRIL) 40 MG tablet TAKE ONE (1) TABLET BY MOUTH ONCE DAILY 09/08/16   Glean Hess, MD   metFORMIN (GLUCOPHAGE) 500 MG tablet TAKE 1 TABLET TWICE A DAY WITH FOOD 08/31/16   Glean Hess, MD  metoprolol succinate (TOPROL-XL) 50 MG 24 hr tablet TAKE ONE (1) TABLET BY MOUTH ONCE DAILY 06/24/16   Glean Hess, MD  omeprazole (PRILOSEC) 40 MG capsule TAKE (1) CAPSULE BY MOUTH TWICE DAILY 08/22/16   Glean Hess, MD  potassium chloride SA (K-DUR,KLOR-CON) 20 MEQ tablet TAKE (1) TABLET BY MOUTH EVERY DAY 06/11/16   Glean Hess, MD  simvastatin (ZOCOR) 10 MG tablet Take by mouth. 03/08/16   Historical Provider, MD  simvastatin (ZOCOR) 40 MG tablet Take 40 mg by mouth at bedtime. 08/02/16   Historical Provider, MD  TRUETRACK TEST test strip Reported on 12/21/2015 01/25/15   Historical Provider, MD    No Known Allergies  Family History  Problem Relation Age of Onset  . Diabetes Mother   . Hypertension Mother     Social History Social History  Substance Use Topics  . Smoking status: Former Smoker    Packs/day: 0.25    Years: 10.00    Types: Cigarettes    Quit date: 09/11/1982  . Smokeless tobacco: Current User    Types: Chew  . Alcohol use 0.6 oz/week    1 Cans of beer per week    Review of Systems  Constitutional: Negative for fever. Eyes: Negative for visual changes. ENT: Negative for sore throat. Cardiovascular: Negative for chest pain. Respiratory: Negative for shortness of breath. Gastrointestinal: Negative for abdominal pain, vomiting and diarrhea. Genitourinary: Negative for dysuria. Musculoskeletal: Negative for back pain. Skin: Negative for rash. Neurological: Negative for headache. 10 point Review of Systems otherwise negative ____________________________________________   PHYSICAL EXAM:  VITAL SIGNS: ED Triage Vitals  Enc Vitals Group     BP 10/05/16 1202 (!) 200/70     Pulse Rate 10/05/16 1202 74     Resp 10/05/16 1202 16     Temp 10/05/16 1202 97.8 F (36.6 C)     Temp Source 10/05/16 1202 Oral     SpO2 10/05/16 1202 97 %     Weight  10/05/16 1206 140 lb (63.5 kg)     Height 10/05/16 1206 5' (1.524 m)     Head Circumference --      Peak Flow --      Pain Score --      Pain Loc --      Pain Edu? --      Excl. in [place]? --      Constitutional: Alert and oriented. Well appearing and in no distress. HEENT   Head: Normocephalic and atraumatic.      Eyes: Conjunctivae are normal. PERRL. Normal extraocular movements.      Ears:         Nose: No congestion/rhinnorhea.   Mouth/Throat: Mucous membranes are moist.   Neck: No stridor. Cardiovascular/Chest: Normal rate, regular rhythm.  No murmurs, rubs, or gallops. Respiratory: Normal respiratory effort without tachypnea nor retractions. Breath sounds  are clear and equal bilaterally. No wheezes/rales/rhonchi. Gastrointestinal: Soft. No distention, no guarding, no rebound. Nontender.    Genitourinary/rectal:Deferred Musculoskeletal: Nontender with normal range of motion in all extremities. No joint effusions.  No lower extremity tenderness.  No edema. Neurologic:  Normal speech and language. No gross or focal neurologic deficits are appreciated. Skin:  Skin is warm, dry and intact. No rash noted. Psychiatric: Mood and affect are normal. Speech and behavior are normal. Patient exhibits appropriate insight and judgment.   ____________________________________________  LABS (pertinent positives/negatives)  Labs Reviewed  GLUCOSE, CAPILLARY - Abnormal; Notable for the following:       Result Value   Glucose-Capillary 276 (*)    All other components within normal limits  CBC - Abnormal; Notable for the following:    RBC 3.50 (*)    Hemoglobin 10.4 (*)    HCT 31.6 (*)    RDW 15.0 (*)    All other components within normal limits  BASIC METABOLIC PANEL - Abnormal; Notable for the following:    CO2 21 (*)    Glucose, Bld 310 (*)    Creatinine, Ser 1.27 (*)    Calcium 8.5 (*)    GFR calc non Af Amer 41 (*)    GFR calc Af Amer 47 (*)    All other components within  normal limits  URINALYSIS, COMPLETE (UACMP) WITH MICROSCOPIC - Abnormal; Notable for the following:    Color, Urine STRAW (*)    APPearance CLEAR (*)    Specific Gravity, Urine 1.004 (*)    Glucose, UA 150 (*)    Hgb urine dipstick MODERATE (*)    Protein, ur 100 (*)    Bacteria, UA MANY (*)    Squamous Epithelial / LPF 0-5 (*)    All other components within normal limits  GLUCOSE, CAPILLARY - Abnormal; Notable for the following:    Glucose-Capillary 240 (*)    All other components within normal limits  TROPONIN I    ____________________________________________    EKG I, Lisa Roca, MD, the attending physician have personally viewed and interpreted all ECGs.  70 bpm. Normal sinus rhythm. Narrow QRS. Normal axis. Nonspecific T-wave ____________________________________________  RADIOLOGY All Xrays were viewed by me. Imaging interpreted by Radiologist.  None __________________________________________  PROCEDURES  Procedure(s) performed: None  Critical Care performed: None  ____________________________________________   ED COURSE / ASSESSMENT AND PLAN  Pertinent labs & imaging results that were available during my care of the patient were reviewed by me and considered in my medical decision making (see chart for details).   Ms. Ivanoff was brought in with normal mental status after being treated EMS with IV glucose for altered mental status/unresponsiveness due to hypoglycemia.  Patient has slight dementia and is unable to give me a real clear history of how she takes her medications, but family states that they set everything up for her. It sounds like she takes her long-acting insulin in the morning and did not take it this morning. However she also did not eat breakfast and then was found to be hypoglycemic.  She's never had an episode of hypoglycemia like this before.  No recent illnesses or infections. No signs or symptoms for stroke or heart attack.  I did  ask her to go ahead and provide a urine sample to ensure no UTI.  No recurrence of hypoglycemia. The only hypoglycemic oral medication is metformin. She is able to eat a meal here and she's not had any return of hypoglycemia. I discussed  with the family, go ahead and hold off on her long-acting insulin and then she can resume this starting tomorrow.  I suspect her hypoglycemic episode was due to long-acting insulin and and not eating breakfast meal. I don't suspect any ongoing issues and she is now maintained her glucose above 100.  I discussed with them the option of taking a half dose of Lantus nightly tablet today, versus just holding off. I think a few hours of hyperglycemia is unlikely to cause her significant harm, and I'm certainly more worried about any induction of hypoglycemia.  Urinalysis showed many bacteria and 0-5 white blood cells and red blood cells, and all this is not the most clear for urinary tract infection, I'm concerned about this episode of hypoglycemia, and would prefer to go ahead and I treat up front. I will send a culture. Patient was given one dose of Rocephin here and a prescription for Keflex.  CONSULTATIONS:   None   Patient / Family / Caregiver informed of clinical course, medical decision-making process, and agree with plan.   I discussed return precautions, follow-up instructions, and discharge instructions with patient and/or family.   Discharge instructions:  You were evaluated after low blood sugar this morning, you do need to make sure that you eat in the morning and on schedule to prevent drop in blood sugar.  You are being treated for likely urinary infection, start antibiotic keflex tomorrow.  Hold off and do not take the Lantus today, resume in the morning as usual.  Return to the emergency department immediately for any worsening condition and confusion altered mental status, fever, vomiting, chest pain, trouble breathing, or any other symptoms  concerning to you. ___________________________________________   FINAL CLINICAL IMPRESSION(S) / ED DIAGNOSES   Final diagnoses:  Hypoglycemia  Urinary tract infection without hematuria, site unspecified              Note: This dictation was prepared with Dragon dictation. Any transcriptional errors that result from this process are unintentional    Lisa Roca, MD 10/05/16 [phone]

## 2016-10-05 NOTE — ED Notes (Signed)
Pt grandson is leaving to go to work and would like to leave his number to be contacted Luther Redo [phone]

## 2016-10-05 NOTE — Discharge Instructions (Signed)
You were evaluated after low blood sugar this morning, you do need to make sure that you eat in the morning and on schedule to prevent drop in blood sugar.  You are being treated for likely urinary infection, start antibiotic keflex tomorrow.  Hold off and do not take the Lantus today, resume in the morning as usual.  Return to the emergency department immediately for any worsening condition and confusion altered mental status, fever, vomiting, chest pain, trouble breathing, or any other symptoms concerning to you.

## 2016-10-07 ENCOUNTER — Other Ambulatory Visit: Payer: Self-pay | Admitting: Internal Medicine

## 2016-10-07 DIAGNOSIS — E118 Type 2 diabetes mellitus with unspecified complications: Secondary | ICD-10-CM

## 2016-10-09 ENCOUNTER — Other Ambulatory Visit: Payer: Self-pay | Admitting: Internal Medicine

## 2016-11-01 ENCOUNTER — Other Ambulatory Visit: Payer: Self-pay | Admitting: Internal Medicine

## 2016-11-01 DIAGNOSIS — E118 Type 2 diabetes mellitus with unspecified complications: Secondary | ICD-10-CM

## 2016-11-20 ENCOUNTER — Other Ambulatory Visit: Payer: Self-pay | Admitting: Internal Medicine

## 2016-11-20 DIAGNOSIS — E118 Type 2 diabetes mellitus with unspecified complications: Secondary | ICD-10-CM

## 2016-11-28 ENCOUNTER — Other Ambulatory Visit: Payer: Self-pay | Admitting: Internal Medicine

## 2016-12-11 ENCOUNTER — Other Ambulatory Visit
Admission: RE | Admit: 2016-12-11 | Discharge: 2016-12-11 | Disposition: A | Discharge status: Home / Self Care | Payer: Medicare HMO | Source: Ambulatory Visit | Attending: Internal Medicine | Admitting: Internal Medicine

## 2016-12-11 ENCOUNTER — Encounter: Payer: Self-pay | Admitting: Internal Medicine

## 2016-12-11 ENCOUNTER — Ambulatory Visit (INDEPENDENT_AMBULATORY_CARE_PROVIDER_SITE_OTHER): Payer: Medicare HMO | Admitting: Internal Medicine

## 2016-12-11 VITALS — BP 136/76 | HR 84 | Ht 62.0 in | Wt 136.0 lb

## 2016-12-11 DIAGNOSIS — E785 Hyperlipidemia, unspecified: Secondary | ICD-10-CM | POA: Diagnosis not present

## 2016-12-11 DIAGNOSIS — N183 Chronic kidney disease, stage 3 unspecified: Secondary | ICD-10-CM

## 2016-12-11 DIAGNOSIS — I119 Hypertensive heart disease without heart failure: Secondary | ICD-10-CM

## 2016-12-11 DIAGNOSIS — I2510 Atherosclerotic heart disease of native coronary artery without angina pectoris: Secondary | ICD-10-CM | POA: Diagnosis not present

## 2016-12-11 DIAGNOSIS — F0280 Dementia in other diseases classified elsewhere without behavioral disturbance: Secondary | ICD-10-CM | POA: Diagnosis not present

## 2016-12-11 DIAGNOSIS — I43 Cardiomyopathy in diseases classified elsewhere: Secondary | ICD-10-CM

## 2016-12-11 DIAGNOSIS — E1169 Type 2 diabetes mellitus with other specified complication: Secondary | ICD-10-CM

## 2016-12-11 DIAGNOSIS — I10 Essential (primary) hypertension: Secondary | ICD-10-CM | POA: Diagnosis not present

## 2016-12-11 DIAGNOSIS — Z794 Long term (current) use of insulin: Secondary | ICD-10-CM

## 2016-12-11 DIAGNOSIS — G309 Alzheimer's disease, unspecified: Secondary | ICD-10-CM

## 2016-12-11 DIAGNOSIS — E1122 Type 2 diabetes mellitus with diabetic chronic kidney disease: Secondary | ICD-10-CM

## 2016-12-11 LAB — BASIC METABOLIC PANEL
Anion gap: 4 — ABNORMAL LOW (ref 5–15)
BUN: 16 mg/dL (ref 6–20)
CHLORIDE: 107 mmol/L (ref 101–111)
CO2: 26 mmol/L (ref 22–32)
CREATININE: 1.34 mg/dL — AB (ref 0.44–1.00)
Calcium: 8.8 mg/dL — ABNORMAL LOW (ref 8.9–10.3)
GFR, EST AFRICAN AMERICAN: 44 mL/min — AB (ref >60)
GFR, EST NON AFRICAN AMERICAN: 38 mL/min — AB (ref >60)
Glucose, Bld: 93 mg/dL (ref 65–99)
POTASSIUM: 5 mmol/L (ref 3.5–5.1)
Sodium: 137 mmol/L (ref 135–145)

## 2016-12-11 NOTE — Patient Instructions (Signed)
Boost or Ensure or Glucerna supplement once a day  Ask Eye doctor to send me a copy of the visit note  Schedule Mammogram  Call me with good phone numbers so that the [facility] nurse can reach you or at least leave a message.

## 2016-12-11 NOTE — Progress Notes (Signed)
Date:  12/11/2016   Name:  Alyssa [name]   DOB:  Aug 22, 1940   MRN:  [id number]   Chief Complaint: Diabetes and Hypertension Diabetes  She presents for her follow-up diabetic visit. She has type 2 diabetes mellitus. Her disease course has been stable. Pertinent negatives for hypoglycemia include no dizziness or headaches. Pertinent negatives for diabetes include no chest pain, no fatigue and no polydipsia. Diabetic complications include nephropathy. Current diabetic treatment includes insulin injections and oral agent (monotherapy). She is compliant with treatment most of the time. Her weight is decreasing steadily.  Hypertension  This is a chronic problem. The problem is resistant. Pertinent negatives include no chest pain, headaches, palpitations or shortness of breath. Past treatments include ACE inhibitors, calcium channel blockers and beta blockers. Hypertensive end-organ damage includes kidney disease and heart failure.  Hyperlipidemia  This is a chronic problem. The problem is controlled. Pertinent negatives include no chest pain or shortness of breath. Current antihyperlipidemic treatment includes statins.  She stays by herself during the day because her daughter Levander Campion has to work.  We had referred to [facility] but the nurse could never reach her. She needs some help with community resources.  Levander Campion has a new phone which she will call back to [place] the new number. Cardiomyopathy - followed by Dr. Rockey Situ.  Seen after hypertensive urgency and episode of demand ischemia.  Last monitored in November but no changes in medications made.  She denies CP, SOB or trouble sleeping in bed. Memory issues - dx's as Alzheimers.  On Aricept.  Per daughter, no behavioral issues, just decreased appetite.  Review of Systems  Constitutional: Positive for unexpected weight change (has lost a bit more weight). Negative for chills, fatigue and fever.  Eyes: Negative for visual disturbance.  Respiratory: Negative  for chest tightness and shortness of breath.   Cardiovascular: Negative for chest pain, palpitations and leg swelling.  Gastrointestinal: Negative for abdominal pain, constipation and diarrhea.  Endocrine: Negative for polydipsia.  Genitourinary: Negative for dysuria and frequency.  Musculoskeletal: Negative for arthralgias.  Skin: Negative for rash.  Neurological: Negative for dizziness and headaches.  Psychiatric/Behavioral: Positive for decreased concentration. Negative for dysphoric mood and sleep disturbance.    Patient Active Problem List   Diagnosis Date Noted  . Cardiomyopathy due to hypertension, without heart failure ([place]) 09/23/2016  . Cardiac murmur 06/03/2016  . Acute GI bleeding 03/05/2016  . Type 2 diabetes mellitus with stage 3 chronic kidney disease, with long-term current use of insulin ([place]) 12/06/2015  . Hyperlipidemia associated with type 2 diabetes mellitus ([place])   . Alzheimer's dementia without behavioral disturbance 03/07/2015  . Coronary artery disease involving native coronary artery of native heart without angina pectoris 12/02/2014  . Allergic rhinitis 11/15/2014  . Essential hypertension 11/15/2014  . Arthritis, degenerative 11/15/2014  . Hypokalemia 11/15/2014  . H/O gastrointestinal hemorrhage 03/28/2011    Prior to Admission medications   Medication Sig Start Date End Date Taking? Authorizing Provider  acetaminophen (TYLENOL) 500 MG tablet Take by mouth.   Yes [provider]  amLODipine (NORVASC) 10 MG tablet TAKE (1) TABLET BY MOUTH EVERY DAY 04/02/16  Yes Glean Hess, MD  aspirin EC 81 MG tablet Take 81 mg by mouth daily.   Yes [provider]  Blood Glucose Monitoring Suppl (TRUETRACK BLOOD GLUCOSE) w/Device KIT 1 each by Does not apply route daily. 12/06/15  Yes Glean Hess, MD  donepezil (ARICEPT) 5 MG tablet TAKE ONE TABLET AT BEDTIME.  09/17/16  Yes Glean Hess, MD  E-Z Chanetta Marshall Lancets Super Thin MISC  11/02/14  Yes  [provider]  GLOBAL INJECT EASE INSULIN SYR 30G X 1/2" 0.3 ML MISC USE ONCE A DAY FOR INSULIN 05/29/16  Yes Glean Hess, MD  glucose blood test strip 1 each by Other route as needed. Aviva accu check glucometer   Yes [provider]  Insulin Syringe-Needle U-100 (INSULIN SYRINGE .3CC/31GX5/16") 31G X 5/16" 0.3 ML MISC as directed. 05/31/11  Yes [provider]  LEVEMIR 100 UNIT/ML injection INJECT 25 UNITS SUBCUTANEOUSLY EVERY NIGHT. 09/27/15  Yes Glean Hess, MD  lisinopril (PRINIVIL,ZESTRIL) 40 MG tablet TAKE ONE (1) TABLET BY MOUTH ONCE DAILY 09/08/16  Yes Glean Hess, MD  metFORMIN (GLUCOPHAGE) 500 MG tablet TAKE 1 TABLET TWICE A DAY WITH FOOD 08/31/16  Yes Glean Hess, MD  metoprolol succinate (TOPROL-XL) 50 MG 24 hr tablet TAKE ONE (1) TABLET BY MOUTH ONCE DAILY 06/24/16  Yes Glean Hess, MD  omeprazole (PRILOSEC) 40 MG capsule TAKE (1) CAPSULE BY MOUTH TWICE DAILY 08/22/16  Yes Glean Hess, MD  potassium chloride SA (K-DUR,KLOR-CON) 20 MEQ tablet TAKE (1) TABLET BY MOUTH EVERY DAY 06/11/16  Yes Glean Hess, MD  simvastatin (ZOCOR) 40 MG tablet TAKE ONE TABLET AT BEDTIME. 10/10/16  Yes Glean Hess, MD  TRUETRACK TEST test strip Reported on 12/21/2015 01/25/15  Yes [provider]    No Known Allergies  Past Surgical History:  Procedure Laterality Date  . ABDOMINAL HYSTERECTOMY    . CHOLECYSTECTOMY    . TONSILLECTOMY      Social History  Substance Use Topics  . Smoking status: Current Every Day Smoker    Packs/day: 0.25    Years: 10.00    Types: Cigarettes    Last attempt to quit: 09/11/1982  . Smokeless tobacco: Current User    Types: Chew  . Alcohol use 0.6 oz/week    1 Cans of beer per week     Medication list has been reviewed and updated.   Physical Exam  Constitutional: She is oriented to person, place, and time. She appears well-developed. No distress.  HENT:  Head: Normocephalic and  atraumatic.  Neck: Normal range of motion. Neck supple. No hepatojugular reflux present. Carotid bruit is not present.  Cardiovascular: Normal rate, regular rhythm and normal pulses.   Murmur heard.  Systolic murmur is present with a grade of 2/6  Pulmonary/Chest: Effort normal and breath sounds normal. No respiratory distress. She has no wheezes.  Abdominal: Soft. Bowel sounds are normal. She exhibits no mass. There is no tenderness. There is no rebound.  Musculoskeletal: She exhibits no edema or tenderness.  Neurological: She is alert and oriented to person, place, and time.  Skin: Skin is warm and dry. No rash noted.  Psychiatric: She has a normal mood and affect. Her speech is normal. She is slowed.  Ox2 Answers simples questions but does not voluntarily interact  Nursing note and vitals reviewed.   BP 136/76   Pulse 84   Ht '5\' 2"'  (1.575 m)   Wt 136 lb (61.7 kg)   SpO2 99%   BMI 24.87 kg/m   Assessment and Plan: 1. Type 2 diabetes mellitus with stage 3 chronic kidney disease, with long-term current use of insulin (HCC) Continue insulin and metformin - Hemoglobin A1c  2. Essential hypertension Fair control on multiple agents - Basic metabolic panel  3. Cardiomyopathy due to hypertension, without heart  failure ([place]) Compensated at this time  4. Coronary artery disease involving native coronary artery of native heart without angina pectoris No current sx  5. Hyperlipidemia associated with type 2 diabetes mellitus ([place]) On statin therapy  6. Alzheimer's dementia without behavioral disturbance, unspecified timing of dementia onset Continue Aricept Will make referral to [facility] when phone numbers updated  No orders of the defined types were placed in this encounter.   Halina Maidens, MD [facility]  12/11/2016

## 2016-12-12 ENCOUNTER — Other Ambulatory Visit: Payer: Self-pay

## 2016-12-12 LAB — HEMOGLOBIN A1C
HEMOGLOBIN A1C: 5.6 % (ref 4.8–5.6)
Mean Plasma Glucose: 114 mg/dL

## 2016-12-12 NOTE — Addendum Note (Signed)
Addended by: Jiles Harold on: 12/12/2016 11:22 AM   Modules accepted: Miquel Dunn

## 2016-12-12 NOTE — Addendum Note (Signed)
Addended by: Jiles Harold on: 12/12/2016 11:21 AM   Modules accepted: Level of Service, SmartSet

## 2016-12-12 NOTE — Progress Notes (Signed)
This encounter was created in error - please disregard.

## 2016-12-12 NOTE — Patient Outreach (Addendum)
[facility]) Care Management  12/12/2016  ARLINE KETTER Apr 14, 1941 [id number]   Telephone Screen  Referral Date: 12/11/16 Referral Source: MD office (Dr. Army Melia)  Referral Reason: "DM, CKD stage 3, cardiomyopathy, HTN, Alzheimer's dementia, community resources for help at home with med med mgmt, etc" Insurance: [employer], [place]   Outreach attempt # 1 to patient. Patient alert & oriented during this call and able verify HIPAA. Attempted to complete screening call with patient but she was poor historian and unable to answer questions. Patient agreed for RN CM to contact her dtr-Dianne and provided RN CM with number to reach her at [phone]. Spoke with dtr at number provided and screening completed. Dtr reports she is overwhelmed with managing patient's care and is looking for some in home support services to assist with patient's care needs.   Social: Patient resides in her home alone. Dtr reports that she along with patient's grand dtr check on her daily. She voices that she patient requires some assistance with ADLs and IADLs. patient uses Medicaid transportation to get back and forth to appts. No recent falls per dr report. She voices that patient is "too hard headed" to use any DME.  Conditions: Per records patient has PMH of DM-insulin dependent, CKD stage 3,  Cardiomyopathy, HTN and Alzheimer's disease. Dtr states that between her and the grand dtr they try to check patient's cbgs as ordered(3x/day) but are not always able to do so. She was unable to recall what cbgs normally range.No recent A1C values. Dtr states that hey do ned helping managing patient's chronic conditions.   Medications: Dtr unsure of how many meds patient taking. States between 10-13. She denies any issues with affording meds. She states that grand dtr fills med planner. Dtr voices that due to patient's memory issues she is forgetting more and more to take her meds.   Appointments:  Patient saw PCP on 12/11/16. She is also followed by cardiologist-Dr. Rockey Situ.    Consent: [facility] services reviewed and discussed. Verbal consent for services provided. Dtr has requested that she or grand dtr be contacted to coordinate [facility] visits. Dtr-Dianne Concha Pyo [phone]. [place] dtr-Charlene Cousins [phone]. ROI on file for both.    Plan: RN CM will notify [facility] administrative assistant of case status. RN CM will send [facility] SW referral for possible in home support and community resources to manage patient's care. RN CM will send [facility] community RN referral for further in home eval/assessment and mgmt of chronic conditions. RN CM will send [place] referral for polypharmacy med review and med mgmt assistance.   Enzo Montgomery, RN,BSN,CCM [place] Management Telephonic Care Management Coordinator Direct Phone: [phone] Toll Free: [phone] Fax: [phone]

## 2016-12-13 ENCOUNTER — Other Ambulatory Visit: Payer: Self-pay | Admitting: *Deleted

## 2016-12-13 NOTE — Patient Outreach (Signed)
[name]) Care Management  12/13/2016  Alyssa [name] 02-Feb-1941 [id number]   Phone call to patient's daughter as instructed by [place] to provide patient and her daughter with  community resources for additional in home support. Patient's daughter easily agitated, did not want to answer questions :I already told the other lady that" "Why are you asking me the same questions".  Patient's daughter states that she would rather this social worker come to see patient to complete assessment.,   Plan: Home visit scheduled for  12/30/16 at 10:am   [name] Care Management [phone]

## 2016-12-14 ENCOUNTER — Other Ambulatory Visit: Payer: Self-pay | Admitting: Internal Medicine

## 2016-12-16 ENCOUNTER — Telehealth: Payer: Self-pay

## 2016-12-16 ENCOUNTER — Other Ambulatory Visit: Payer: Self-pay

## 2016-12-16 DIAGNOSIS — E118 Type 2 diabetes mellitus with unspecified complications: Secondary | ICD-10-CM

## 2016-12-16 DIAGNOSIS — I10 Essential (primary) hypertension: Secondary | ICD-10-CM

## 2016-12-16 MED ORDER — METFORMIN HCL 500 MG PO TABS: 500.0000 mg | ORAL_TABLET | Freq: Two times a day (BID) | ORAL | 3 refills | Status: DC

## 2016-12-16 MED ORDER — "INSULIN SYRINGE-NEEDLE U-100 30G X 1/2"" 0.3 ML MISC": 3 refills | Status: DC

## 2016-12-16 MED ORDER — LISINOPRIL 40 MG PO TABS: ORAL_TABLET | ORAL | 1 refills | Status: DC

## 2016-12-16 MED ORDER — SIMVASTATIN 40 MG PO TABS: 40.0000 mg | ORAL_TABLET | Freq: Every day | ORAL | 2 refills | Status: DC

## 2016-12-16 MED ORDER — AMLODIPINE BESYLATE 10 MG PO TABS: ORAL_TABLET | ORAL | 3 refills | Status: DC

## 2016-12-16 MED ORDER — METOPROLOL SUCCINATE ER 50 MG PO TB24: ORAL_TABLET | ORAL | 5 refills | Status: DC

## 2016-12-16 NOTE — Telephone Encounter (Signed)
Pt's daughter called to get refills on maintenance medications. Sent these in to [facility] [place].

## 2016-12-17 ENCOUNTER — Other Ambulatory Visit: Payer: Self-pay | Admitting: *Deleted

## 2016-12-17 NOTE — Patient Outreach (Signed)
Phone call successful, follow up on referral received 5/17 from [place] telephonic CM coordinator to follow pt with community nurse case management services.  View in Epic MD (Dr. Halina Maidens) referral to [facility] 5/16.   Spoke with daughter Ernestene Mention, HIPAA verified on pt, discussed purpose of call to follow up on referral.   RN CM discussed with daughter saw pt in the past to which she did recall. Daughter reports she lost contact with RN CM because of issues with her phone.   Discussed doing a home visit at the same time LCSW sees pt to which she was appreciative of, is on vacation and will be present during home visit.  Plan:  As discussed with pt's daughter, plan to follow up with pt next early next month- initial home visit.   Zara Chess.   [place] Care Management  [phone]

## 2016-12-19 ENCOUNTER — Ambulatory Visit: Payer: Self-pay | Admitting: Pharmacist

## 2016-12-19 ENCOUNTER — Ambulatory Visit
Admission: RE | Admit: 2016-12-19 | Discharge: 2016-12-19 | Disposition: A | Discharge status: Home / Self Care | Payer: Medicare HMO | Source: Ambulatory Visit | Attending: Internal Medicine | Admitting: Internal Medicine

## 2016-12-19 ENCOUNTER — Other Ambulatory Visit: Payer: Self-pay | Admitting: Pharmacist

## 2016-12-19 DIAGNOSIS — Z1231 Encounter for screening mammogram for malignant neoplasm of breast: Secondary | ICD-10-CM | POA: Insufficient documentation

## 2016-12-19 NOTE — Patient Outreach (Signed)
[place] [facility]) Care Management  12/19/2016  Alyssa [name] 25-Nov-1940 [id number]   76 year old female with history of Alzheimers, T2DM, neuropathy, chronic kidney disease stage III, CAD, cardiomyopathy, HTN, HLD and history of tobacco abuse.  Patient referred to [place] services by [place] for assistance with medication adherence and medication management. Patient's daughter and grand-daughter assist patient with her medications and requested to be contacted.     Successful outreach phone call to patient's daughter, Alyssa [name], this morning.  HIPAA identifiers verified.  Daughter unable to review medications over the phone today but confirmed that patient does have a seven day pillbox with different time slots in each day.  She states patient often forgets to take medications from the pillbox.  She is agreeable for a home visit next week where I will bring a medication alarm system to assist with medication adherence.    Plan:  Home visit next Tuesday, May 29 at 1:00 pm.    Ralene Bathe, PharmD, [place] [phone]

## 2016-12-24 ENCOUNTER — Other Ambulatory Visit: Payer: Self-pay | Admitting: Pharmacist

## 2016-12-24 NOTE — Patient Outreach (Addendum)
[facility]) Care Management  [place]   12/24/2016  Alyssa [name] 02-26-1941 [id number]  Subjective: 76 year old female with history of Alzheimers, T2DM, neuropathy, chronic kidney disease stage III, CAD, cardiomyopathy, HTN, HLD and history of tobacco abuse.  Patient referred to [place] services by [place] for assistance with medication adherence and medication management. Patient's daughter and grand-daughter assist patient with her medications and requested to be contacted.  Outreach home visit to patient and daughter today with pharmacy student.  HIPAA identifiers verified.  Daughter brought out grocery bag of current medications.  Several medications per recent outpatient visit with Dr. Army Melia were missing and several bottles had only a few pills left.  Daughter stated patient was also out of her blood glucose test strips and lancets and was unable to check her blood sugar this morning.  She tried to check it for patient in the morning either before or after breakfast.  She thought that the most recent readings were 112 and 120.  Daughter did not have meter with her to check log.  Daughter stated she or her significant other help administer insulin to patient in the morning along with daily medications. Daughter states that the Levemir dose was recently reduced from 25 units daily --> 20 units daily.  She states that the granddaughter stops by in the evenings after work when she is able, picks up prescriptions for patient, and fills weekly pillbox.  Pillbox currently empty during review today (Tuesday).  Daughter states she also check patient's blood pressure periodically and has an automated machine at the house.  Daughter and patient both agree she often misses her evening doses due to forgetfulness from Alzheimers Disease.   Objective:  Recent labs from 5/16:   Hemoglobin A1C = 5.6  SCr = 1.34 (EGFR = 44, CrCl = 36 ml/min)  K+ =  5.0  Glucose = 93  Calcium 8.8  Encounter Medications: Outpatient Encounter Prescriptions as of 12/24/2016  Medication Sig Note  . acetaminophen (TYLENOL) 500 MG tablet Take 500 mg by mouth every 6 (six) hours as needed for mild pain.  04/11/2016: Received from: [place]: Take 500 mg by mouth as needed.    Marland Kitchen amLODipine (NORVASC) 10 MG tablet TAKE (1) TABLET BY MOUTH EVERY DAY   . aspirin EC 81 MG tablet Take 81 mg by mouth daily.   . Blood Glucose Monitoring Suppl (TRUETRACK BLOOD GLUCOSE) w/Device KIT 1 each by Does not apply route daily. (Patient taking differently: 1 each by Does not apply route daily. )   . donepezil (ARICEPT) 5 MG tablet TAKE ONE TABLET AT BEDTIME.   Marland Kitchen E-Z Ject Lancets Super Thin MISC    . glucose blood test strip 1 each by Other route daily. Aviva accu check glucometer    . insulin detemir (LEVEMIR) 100 UNIT/ML injection Inject 20 Units into the skin daily.    . Insulin Syringe-Needle U-100 (GLOBAL INJECT EASE INSULIN SYR) 30G X 1/2" 0.3 ML MISC USE ONCE A DAY FOR INSULIN   . Insulin Syringe-Needle U-100 (INSULIN SYRINGE .3CC/31GX5/16") 31G X 5/16" 0.3 ML MISC as directed.   Marland Kitchen lisinopril (PRINIVIL,ZESTRIL) 40 MG tablet TAKE ONE (1) TABLET BY MOUTH ONCE DAILY   . metFORMIN (GLUCOPHAGE) 500 MG tablet Take 1 tablet (500 mg total) by mouth 2 (two) times daily with a meal.   . metoprolol succinate (TOPROL-XL) 50 MG 24 hr tablet TAKE ONE (1) TABLET BY MOUTH  ONCE DAILY   . omeprazole (PRILOSEC) 40 MG capsule TAKE (1) CAPSULE BY MOUTH TWICE DAILY   . potassium chloride SA (K-DUR,KLOR-CON) 20 MEQ tablet TAKE (1) TABLET BY MOUTH EVERY DAY   . simvastatin (ZOCOR) 40 MG tablet Take 1 tablet (40 mg total) by mouth at bedtime.   Angelia Mould TEST test strip Reported on 12/21/2015   . [DISCONTINUED] LEVEMIR 100 UNIT/ML injection INJECT 25 UNITS UNDER THE SKIN NIGHTLY    No facility-administered encounter medications on file as of 12/24/2016.      Functional Status: No flowsheet data found.  Fall/Depression Screening: Fall Risk  12/12/2016 04/11/2016 12/21/2015  Falls in the past year? No No No   PHQ 2/9 Scores 12/12/2016 12/11/2016 04/11/2016 12/21/2015 12/06/2015 03/07/2015  PHQ - 2 Score - 0 0 2 1 0  PHQ- 9 Score - - - 8 - -  Exception Documentation Other- indicate reason in comment box - - - - -  Not completed call completed w/ dtr - - - - -    Assessment:  Drugs sorted by system:   Neurologic/Psychologic: Donepezil  Cardiovascular: Aspirin, amlodipine, lisinopril, metoprolol succinate, simvastatin  Gastrointestinal: Omeprazole  Endocrine: Levemir, metformin  Pain: Acetaminophen PRN  Vitamins/Minerals: Potassium  Reviewed allergies and medications with patient and daughter.     Updated insulin dose in [facility].    Called patient's pharmacy, [facility], and requested refills on the following: metformin, metoprolol, aspirin, potassium, simvastatin, lisinopril, omeprazole, amlodipine, insulin needles, lancets, and test strips.  All will be available for pick-up today except amlodipine and omeprazole will be ready tomorrow due to last fill date per insurance.    Granddaughter will pick up medications from pharmacy and bring to patient today and tomorrow.  She plans to fill up pillbox for patient tonight.    Reviewed and set up a medication alarm system for patient to aid in medication adherence.   Set 2 alarms - 9AM and 6PM per patient request.    Left [facility] notebook with patient and reviewed the diabetes and hypertension categories with areas to log blood sugar and blood pressure readings.   Medication Issues Noted:  1.  Recent potassium level of 5.0 mmol/L with daily potassium ordered however patient was out of this medication today and no empty bottles found.  Unclear if this recent level reflects when patient has been on or off the potassium.  Monitor closely to avoid hyperkalemia as patient is refilling the potassium  today.  2.  Patient's eGFR = 44 ml/min and patient currently on metformin. Package insert states if eGFR falls between 30 - 45 ml/min during therapy to consider benefits and risks of therapy of continuing therapy.  Use is contraindicated when eGFR < 30 ml/min.  Monitor renal function closely while on metformin.   Plan: Call provider to request 90 day supply of medications starting next month (June) per patient's daughter request.   Route encounter to provider.   Call daughter this Friday to check in on how the medication alarm system is working for patient.    Ralene Bathe, PharmD, [place] [phone]

## 2016-12-27 ENCOUNTER — Other Ambulatory Visit: Payer: Self-pay | Admitting: Pharmacist

## 2016-12-27 ENCOUNTER — Ambulatory Visit: Payer: Self-pay | Admitting: Pharmacist

## 2016-12-27 NOTE — Patient Outreach (Signed)
[place] [facility]) Care Management  12/27/2016  DENEAN PAVON 06-Mar-1941 [id number]   Successful outreach phone-call to patient's daughter, [name]. HIPAA identifiers verified. Daughter states that the alarm system is going well and that she and her daughter (patient's granddaughter) still have been coming to check on patient every morning and night.  Daughter states that patient has not missed any doses of her medication this week.  Daughter denies any further medication issues or needs.  Daughter does state that it is becoming harder for her to have such a big role in all her mother's home health needs.  I reviewed that she has a [facility] visit on Monday with an RN and LCSW and to express these needs to them as well.    Pharmacy will close case at this time but is happy to assist in the future if needed.   Ralene Bathe, PharmD, [place] [phone]

## 2016-12-30 ENCOUNTER — Ambulatory Visit: Payer: Self-pay | Admitting: *Deleted

## 2016-12-30 ENCOUNTER — Other Ambulatory Visit: Payer: Self-pay | Admitting: *Deleted

## 2016-12-30 NOTE — Patient Outreach (Signed)
Attempt made to contact pt's daughter Lilly [place] to schedule home visit for next week  as this RN CM was informed by Chrystal THN LCSW that daughter relayed to her need to reschedule today's visit (joint visit planned).   Unable to leave a voice message as message on phone- not available at this time, try again later.    Plan:  RN CM to made another attempt to call daughter, schedule home visit for next week.    Zara Chess.   [place] Care Management  [phone]

## 2016-12-30 NOTE — Patient Outreach (Signed)
[place] [facility]) Care Management  12/30/2016  ARADHANA GIN [birth year] [id number]   Phone call to patient's daughter to discuss re-scheduling home visit for a later time today. Per patient's daughter, she was glad for the call as she needed to re-schedule the appointment for today and could not find my contact information.    Initial home visit scheduled for Thursday, 01/01/17 at 10:00am.     [place], [place] Management [phone]

## 2017-01-01 ENCOUNTER — Other Ambulatory Visit: Payer: Self-pay | Admitting: *Deleted

## 2017-01-01 NOTE — Patient Outreach (Signed)
Follow up phone call successful.  Spoke with daughter Peter [place] (on consent form), HIPAA verified on pt, discussed purpose of call - schedule pt's home visit, 6/4 home visit cancelled by daughter.    Plan:  As discussed with daughter, plan  to follow up with pt 6/18 - home visit.    Zara Chess.   [place] Care Management  [phone]

## 2017-01-02 ENCOUNTER — Other Ambulatory Visit: Payer: Self-pay | Admitting: *Deleted

## 2017-01-02 ENCOUNTER — Encounter: Payer: Self-pay | Admitting: *Deleted

## 2017-01-02 NOTE — Patient Outreach (Signed)
[facility]) Care Management  [place]  01/02/2017   TENNILLE [name] 05-Dec-1940 [id number]  Subjective:   Patient referred to this social worker to  Assist with community resources for in home support. Patient and patient's daughter present during visit. Patient's daughter states that patient has been diagnosed with Dementia. She described episodes of short term memory loss and just last week left the house walking to the store and was not able to find her way back home.  Daughter does not live with patient, however available to assist with medication management, meals and other household needs. Per patient's daughter, patient's granddaughter manges patient's finances. Patient able to make small meals, complete minor household duties and can generally take care of her own ADL's.  Patient's daughter would like referral for meals on wheels, personal care services and the congregate meal program through the senior center. Both patient and daughter utilize [employer]  for their transportation needs. Objective:   Encounter Medications:  Outpatient Encounter Prescriptions as of 01/02/2017  Medication Sig Note  . acetaminophen (TYLENOL) 500 MG tablet Take 500 mg by mouth every 6 (six) hours as needed for mild pain.  04/11/2016: Received from: [place]: Take 500 mg by mouth as needed.    Marland Kitchen amLODipine (NORVASC) 10 MG tablet TAKE (1) TABLET BY MOUTH EVERY DAY   . aspirin EC 81 MG tablet Take 81 mg by mouth daily.   . Blood Glucose Monitoring Suppl (TRUETRACK BLOOD GLUCOSE) w/Device KIT 1 each by Does not apply route daily. (Patient taking differently: 1 each by Does not apply route daily. )   . donepezil (ARICEPT) 5 MG tablet TAKE ONE TABLET AT BEDTIME.   Marland Kitchen E-Z Ject Lancets Super Thin MISC    . glucose blood test strip 1 each by Other route daily. Aviva accu check glucometer    . insulin detemir (LEVEMIR) 100 UNIT/ML injection Inject 20 Units into the  skin daily.    . Insulin Syringe-Needle U-100 (GLOBAL INJECT EASE INSULIN SYR) 30G X 1/2" 0.3 ML MISC USE ONCE A DAY FOR INSULIN   . Insulin Syringe-Needle U-100 (INSULIN SYRINGE .3CC/31GX5/16") 31G X 5/16" 0.3 ML MISC as directed.   Marland Kitchen lisinopril (PRINIVIL,ZESTRIL) 40 MG tablet TAKE ONE (1) TABLET BY MOUTH ONCE DAILY   . metFORMIN (GLUCOPHAGE) 500 MG tablet Take 1 tablet (500 mg total) by mouth 2 (two) times daily with a meal.   . metoprolol succinate (TOPROL-XL) 50 MG 24 hr tablet TAKE ONE (1) TABLET BY MOUTH ONCE DAILY   . omeprazole (PRILOSEC) 40 MG capsule TAKE (1) CAPSULE BY MOUTH TWICE DAILY   . potassium chloride SA (K-DUR,KLOR-CON) 20 MEQ tablet TAKE (1) TABLET BY MOUTH EVERY DAY   . simvastatin (ZOCOR) 40 MG tablet Take 1 tablet (40 mg total) by mouth at bedtime.   Angelia Mould TEST test strip Reported on 12/21/2015    No facility-administered encounter medications on file as of 01/02/2017.     Functional Status:  In your present state of health, do you have any difficulty performing the following activities: 01/02/2017  Hearing? N  Vision? Y  Difficulty concentrating or making decisions? Y  Walking or climbing stairs? Y  Dressing or bathing? N  Doing errands, shopping? Y  Preparing Food and eating ? N  Using the Toilet? N  In the past six months, have you accidently leaked urine? Y  Do you have problems with loss of bowel control? Y  Managing your Medications?  Y  Managing your Finances? Y  Housekeeping or managing your Housekeeping? Y  Some recent data might be hidden    Fall/Depression Screening: Fall Risk  01/02/2017 12/12/2016 04/11/2016  Falls in the past year? No No No   PHQ 2/9 Scores 01/02/2017 12/12/2016 12/11/2016 04/11/2016 12/21/2015 12/06/2015 03/07/2015  PHQ - 2 Score 0 - 0 0 2 1 0  PHQ- 9 Score - - - - 8 - -  Exception Documentation - Other- indicate reason in comment box - - - - -  Not completed - call completed w/ dtr - - - - -    Assessment:  Patient very friendly  and welcoming. Patient's home neat and clean.  Patient open to personal care services, referral to meal on wheels, and the meal program at the senior center. Per patient, she is wanting to get out of the house more.  Patient describes having a positive family support network. Patient's daughter would also like referral to a eye doctor that takes her insurance.  Plan:   This Education officer, museum to refer patient to meals on wheels              This social worker to send request form for personal care services to patient's providers              office.              Follow up phone call in 1 month.    [name] [facility] Care Management [phone]

## 2017-01-08 ENCOUNTER — Other Ambulatory Visit: Payer: Self-pay | Admitting: *Deleted

## 2017-01-08 NOTE — Patient Outreach (Addendum)
[facility]) Care Management  01/08/2017  Alyssa [name] [birth year] [id number]   Phone call to patient to discuss location choice for the congregate meal program. HIPPA compliant voicemail message left requesting a return call.   [name] [facility] Care Management [phone]

## 2017-01-08 NOTE — Patient Outreach (Signed)
[facility]) Care Management  01/08/2017  TAYA ASHBAUGH [birth year] [id number]   Phone call to the Congregational Meal Program through [facility].  Referral completed with [facility].  Program is Monday-Friday from 9am-1pm. [employer] will provide the transportation, however Ms. Snipes will need to put in the referral and receive authorization before the transportation can be scheduled. Ms. Renaldo Fiddler will need to know which location patient will want to go to, either [facility] or the [employer]  Plan: This Education officer, museum to contact patient to discuss location choice for the meal program.    [place], [place] Management [phone]

## 2017-01-09 ENCOUNTER — Other Ambulatory Visit: Payer: Self-pay | Admitting: *Deleted

## 2017-01-09 NOTE — Patient Outreach (Addendum)
[facility]) Care Management  01/09/2017  Alyssa [name] [birth year] [id number]   Phone call to patient to confirm requested location to attend the congregate meal program. Patient prefers the [facility].  Plan: This Education officer, museum will inform the Hospital doctor.    [name] [facility] Care Management [phone]

## 2017-01-10 ENCOUNTER — Encounter: Payer: Self-pay | Admitting: *Deleted

## 2017-01-13 ENCOUNTER — Other Ambulatory Visit: Payer: Self-pay | Admitting: *Deleted

## 2017-01-13 ENCOUNTER — Ambulatory Visit: Payer: Self-pay | Admitting: *Deleted

## 2017-01-13 NOTE — Patient Outreach (Signed)
Follow up call to daughter [name] (on consent form) to call made earlier today (no voice message left). Spoke with Dianne, HIPAA verified on pt, who discussed need to cancel pt's home visit today.  Dianne reports she is at pt's home now, pt sick (cold, maybe sinus)- hear pt say to daughter while on the phone started 6/16.  Daughter reports pt is lying down now, if no improvement, will have pt follow up with MD.      Plan:  As discussed with daughter Levander Campion, plan to follow up with pt next week- home visit.     Zara Chess.   [place] Care Management  [phone]

## 2017-01-14 ENCOUNTER — Ambulatory Visit: Payer: Medicare HMO | Admitting: Internal Medicine

## 2017-01-15 NOTE — Telephone Encounter (Signed)
This encounter was created in error - please disregard.

## 2017-01-20 ENCOUNTER — Encounter: Payer: Self-pay | Admitting: *Deleted

## 2017-01-20 ENCOUNTER — Other Ambulatory Visit: Payer: Self-pay | Admitting: *Deleted

## 2017-01-20 NOTE — Patient Outreach (Signed)
[place] [facility]) Care Management   01/20/2017  Alyssa [name] 05-09-1941 [id number]  Alyssa [name] is an 76 y.o. female  Subjective:  Pt reports on chronic pain in left hip, hurts when ambulating, resting helps.  Daughter  Reports pt was suppose to see Dr. Army Melia this month,cancelled because pt had a cold.   Daughter  Reports [facility] social worker is working on getting pt a walker, starting at [place], Meals on  [place].   Daughter reports pt does not have an appetite, but makes sure pt eats.  Pt reports she is  Loosing weight.   Daughter reports granddaughter is still filling pt's pill planner, have an alarm system That  is working well.  Daughter reports she is checking on pt 1-2 times a day, monitors sugars,  Today 227.     Objective:   Vitals:   01/20/17 1019  BP: (!) 170/70  Pulse: 71  Resp: (!) 24    ROS  Physical Exam  Constitutional: She is oriented to person, place, and time. She appears well-developed and well-nourished.  Cardiovascular: Normal rate, regular rhythm and normal heart sounds.   Respiratory: Effort normal and breath sounds normal.  GI: Soft.  Musculoskeletal: Normal range of motion. She exhibits edema.  Edema right thumb, hand   Neurological: She is alert and oriented to person, place, and time.  Skin: Skin is warm and dry.  Psychiatric: She has a normal mood and affect. Her behavior is normal.  Difficulty remembering physical  address, daughter present- verified.     Encounter Medications:   Outpatient Encounter Prescriptions as of 01/20/2017  Medication Sig Note  . acetaminophen (TYLENOL) 500 MG tablet Take 500 mg by mouth every 6 (six) hours as needed for mild pain.  01/20/2017: As needed.   Marland Kitchen amLODipine (NORVASC) 10 MG tablet TAKE (1) TABLET BY MOUTH EVERY DAY   . aspirin EC 81 MG tablet Take 81 mg by mouth daily.   . Blood Glucose Monitoring Suppl (RELION CONFIRM GLUCOSE MONITOR) w/Device KIT by Does not apply route.   .  donepezil (ARICEPT) 5 MG tablet TAKE ONE TABLET AT BEDTIME.   Marland Kitchen E-Z Ject Lancets Super Thin MISC    . glucose blood test strip 1 each by Other route daily. Aviva accu check glucometer    . glucose blood test strip 1 each by Other route as needed. Use as instructed   . insulin detemir (LEVEMIR) 100 UNIT/ML injection Inject 20 Units into the skin daily.    . Insulin Syringe-Needle U-100 (GLOBAL INJECT EASE INSULIN SYR) 30G X 1/2" 0.3 ML MISC USE ONCE A DAY FOR INSULIN   . Insulin Syringe-Needle U-100 (INSULIN SYRINGE .3CC/31GX5/16") 31G X 5/16" 0.3 ML MISC as directed.   Marland Kitchen lisinopril (PRINIVIL,ZESTRIL) 40 MG tablet TAKE ONE (1) TABLET BY MOUTH ONCE DAILY   . metFORMIN (GLUCOPHAGE) 500 MG tablet Take 1 tablet (500 mg total) by mouth 2 (two) times daily with a meal.   . metoprolol succinate (TOPROL-XL) 50 MG 24 hr tablet TAKE ONE (1) TABLET BY MOUTH ONCE DAILY   . omeprazole (PRILOSEC) 40 MG capsule TAKE (1) CAPSULE BY MOUTH TWICE DAILY   . potassium chloride SA (K-DUR,KLOR-CON) 20 MEQ tablet TAKE (1) TABLET BY MOUTH EVERY DAY   . simvastatin (ZOCOR) 40 MG tablet Take 1 tablet (40 mg total) by mouth at bedtime.   . Blood Glucose Monitoring Suppl (TRUETRACK BLOOD GLUCOSE) w/Device KIT 1 each by Does not apply route daily. (Patient  not taking: Reported on 01/20/2017)   . TRUETRACK TEST test strip Reported on 12/21/2015    No facility-administered encounter medications on file as of 01/20/2017.     Functional Status:   In your present state of health, do you have any difficulty performing the following activities: 01/02/2017  Hearing? N  Vision? Y  Difficulty concentrating or making decisions? Y  Walking or climbing stairs? Y  Dressing or bathing? N  Doing errands, shopping? Y  Preparing Food and eating ? N  Using the Toilet? N  In the past six months, have you accidently leaked urine? Y  Do you have problems with loss of bowel control? Y  Managing your Medications? Y  Managing your Finances? Y   Housekeeping or managing your Housekeeping? Y  Some recent data might be hidden    Fall/Depression Screening:    Fall Risk  01/02/2017 12/12/2016 04/11/2016  Falls in the past year? No No No   PHQ 2/9 Scores 01/02/2017 12/12/2016 12/11/2016 04/11/2016 12/21/2015 12/06/2015 03/07/2015  PHQ - 2 Score 0 - 0 0 2 1 0  PHQ- 9 Score - - - - 8 - -  Exception Documentation - Other- indicate reason in comment box - - - - -  Not completed - call completed w/ dtr - - - - -    Assessment:  Pleasant 76 year old woman, resides alone, daughter checks on pt 1-2 times daily.  Lungs clear, chronic pain in left hip- +3 today.   Edema noted right thumb/hand    DM: review of pt's glucometer, sugar today 227, other sugars [phone], no recent low readings.   Hypertension:  BP today 170/70 (medications reported taken 2 hours earlier). EMMI information given - reviewed     With pt and daughter (Hypertension- Health Problems. Hypertension- What you can do.)  Plan:  As discussed with pt and daughter, plan to continue to provide pt with community nurse case     Management services- follow up again next month with home visit.           Dr. Army Melia informed of [facility] involvement - barrier letter sent by in basket.            Plan to send Dr. Army Melia by in basket today's home visit encounter.               THN CM Care Plan Problem One     Most Recent Value  Care Plan Problem One  Hypertension- self management   Role Documenting the Problem One  Care Management [place] for Problem One  Active  THN Long Term Goal   Pt would see a decrease in BP within the next 60 days   THN Long Term Goal Start Date  01/20/17  Interventions for Problem One Long Term Goal  Emmi information- HTN, Health problems, HTN-What you can do, provided to pt and daughter, reveiwed.   THN CM Short Term Goal #1   Daughter to check pt's BP 2-3 times a week, record in the next 30 days   THN CM Short Term Goal #1 Start Date  01/20/17   Interventions for Short Term Goal #1  Discussed with pt' s daughter monitoring pt's BP 2-3 times a week (different times of day), record, call MD if elevated.   THN CM Short Term Goal #2   Pt would see improvement in adherence with low Na+ diet in the next 30 days   THN CM Short Term Goal #2  Start Date  01/20/17  Interventions for Short Term Goal #2  Reviewed with pt/daughter foods low/high in sodium, importance of reading food labels.       Zara Chess.   [place] Care Management  [phone]

## 2017-01-25 ENCOUNTER — Emergency Department: Payer: Medicare HMO

## 2017-01-25 ENCOUNTER — Other Ambulatory Visit: Payer: Self-pay | Admitting: Internal Medicine

## 2017-01-25 ENCOUNTER — Observation Stay
Admission: EM | Admit: 2017-01-25 | Discharge: 2017-01-26 | Disposition: A | Discharge status: Home / Self Care | Payer: Medicare HMO | Attending: Specialist | Admitting: Specialist

## 2017-01-25 ENCOUNTER — Encounter: Payer: Self-pay | Admitting: Emergency Medicine

## 2017-01-25 DIAGNOSIS — E119 Type 2 diabetes mellitus without complications: Secondary | ICD-10-CM | POA: Diagnosis not present

## 2017-01-25 DIAGNOSIS — K219 Gastro-esophageal reflux disease without esophagitis: Secondary | ICD-10-CM | POA: Insufficient documentation

## 2017-01-25 DIAGNOSIS — R55 Syncope and collapse: Secondary | ICD-10-CM | POA: Diagnosis not present

## 2017-01-25 DIAGNOSIS — I5022 Chronic systolic (congestive) heart failure: Secondary | ICD-10-CM | POA: Insufficient documentation

## 2017-01-25 DIAGNOSIS — Z794 Long term (current) use of insulin: Secondary | ICD-10-CM | POA: Diagnosis not present

## 2017-01-25 DIAGNOSIS — E872 Acidosis, unspecified: Secondary | ICD-10-CM

## 2017-01-25 DIAGNOSIS — I2510 Atherosclerotic heart disease of native coronary artery without angina pectoris: Secondary | ICD-10-CM | POA: Diagnosis not present

## 2017-01-25 DIAGNOSIS — M1990 Unspecified osteoarthritis, unspecified site: Secondary | ICD-10-CM | POA: Insufficient documentation

## 2017-01-25 DIAGNOSIS — I130 Hypertensive heart and chronic kidney disease with heart failure and stage 1 through stage 4 chronic kidney disease, or unspecified chronic kidney disease: Secondary | ICD-10-CM | POA: Diagnosis not present

## 2017-01-25 DIAGNOSIS — R680 Hypothermia, not associated with low environmental temperature: Secondary | ICD-10-CM | POA: Insufficient documentation

## 2017-01-25 DIAGNOSIS — G309 Alzheimer's disease, unspecified: Secondary | ICD-10-CM | POA: Diagnosis not present

## 2017-01-25 DIAGNOSIS — I700 Atherosclerosis of aorta: Secondary | ICD-10-CM | POA: Insufficient documentation

## 2017-01-25 DIAGNOSIS — R6510 Systemic inflammatory response syndrome (SIRS) of non-infectious origin without acute organ dysfunction: Secondary | ICD-10-CM | POA: Diagnosis not present

## 2017-01-25 DIAGNOSIS — E162 Hypoglycemia, unspecified: Secondary | ICD-10-CM | POA: Diagnosis present

## 2017-01-25 DIAGNOSIS — E1151 Type 2 diabetes mellitus with diabetic peripheral angiopathy without gangrene: Secondary | ICD-10-CM | POA: Insufficient documentation

## 2017-01-25 DIAGNOSIS — Z79899 Other long term (current) drug therapy: Secondary | ICD-10-CM | POA: Insufficient documentation

## 2017-01-25 DIAGNOSIS — R4182 Altered mental status, unspecified: Secondary | ICD-10-CM | POA: Diagnosis not present

## 2017-01-25 DIAGNOSIS — F0280 Dementia in other diseases classified elsewhere without behavioral disturbance: Secondary | ICD-10-CM | POA: Insufficient documentation

## 2017-01-25 DIAGNOSIS — E11649 Type 2 diabetes mellitus with hypoglycemia without coma: Secondary | ICD-10-CM | POA: Diagnosis not present

## 2017-01-25 DIAGNOSIS — E1122 Type 2 diabetes mellitus with diabetic chronic kidney disease: Secondary | ICD-10-CM | POA: Insufficient documentation

## 2017-01-25 DIAGNOSIS — E876 Hypokalemia: Secondary | ICD-10-CM | POA: Insufficient documentation

## 2017-01-25 DIAGNOSIS — N183 Chronic kidney disease, stage 3 unspecified: Secondary | ICD-10-CM

## 2017-01-25 DIAGNOSIS — E785 Hyperlipidemia, unspecified: Secondary | ICD-10-CM | POA: Diagnosis not present

## 2017-01-25 DIAGNOSIS — I43 Cardiomyopathy in diseases classified elsewhere: Secondary | ICD-10-CM | POA: Diagnosis not present

## 2017-01-25 DIAGNOSIS — Z90710 Acquired absence of both cervix and uterus: Secondary | ICD-10-CM | POA: Diagnosis not present

## 2017-01-25 DIAGNOSIS — Z7982 Long term (current) use of aspirin: Secondary | ICD-10-CM | POA: Diagnosis not present

## 2017-01-25 DIAGNOSIS — G3189 Other specified degenerative diseases of nervous system: Secondary | ICD-10-CM | POA: Insufficient documentation

## 2017-01-25 DIAGNOSIS — F17210 Nicotine dependence, cigarettes, uncomplicated: Secondary | ICD-10-CM | POA: Insufficient documentation

## 2017-01-25 DIAGNOSIS — T68XXXA Hypothermia, initial encounter: Secondary | ICD-10-CM | POA: Diagnosis not present

## 2017-01-25 DIAGNOSIS — I509 Heart failure, unspecified: Secondary | ICD-10-CM | POA: Diagnosis not present

## 2017-01-25 LAB — CBC WITH DIFFERENTIAL/PLATELET
BASOS PCT: 0 %
Basophils Absolute: 0 10*3/uL (ref 0–0.1)
EOS ABS: 0 10*3/uL (ref 0–0.7)
EOS PCT: 1 %
HCT: 32.7 % — ABNORMAL LOW (ref 35.0–47.0)
HEMOGLOBIN: 11.1 g/dL — AB (ref 12.0–16.0)
Lymphocytes Relative: 14 %
Lymphs Abs: 1 10*3/uL (ref 1.0–3.6)
MCH: 29.8 pg (ref 26.0–34.0)
MCHC: 33.9 g/dL (ref 32.0–36.0)
MCV: 87.9 fL (ref 80.0–100.0)
Monocytes Absolute: 0.5 10*3/uL (ref 0.2–0.9)
Monocytes Relative: 8 %
Neutro Abs: 5.5 10*3/uL (ref 1.4–6.5)
Neutrophils Relative %: 77 %
PLATELETS: 228 10*3/uL (ref 150–440)
RBC: 3.72 MIL/uL — AB (ref 3.80–5.20)
RDW: 14.3 % (ref 11.5–14.5)
WBC: 7.1 10*3/uL (ref 3.6–11.0)

## 2017-01-25 LAB — COMPREHENSIVE METABOLIC PANEL
ALBUMIN: 3.5 g/dL (ref 3.5–5.0)
ALK PHOS: 51 U/L (ref 38–126)
ALT: 14 U/L (ref 14–54)
ANION GAP: 9 (ref 5–15)
AST: 38 U/L (ref 15–41)
BUN: 15 mg/dL (ref 6–20)
CALCIUM: 8.9 mg/dL (ref 8.9–10.3)
CHLORIDE: 101 mmol/L (ref 101–111)
CO2: 24 mmol/L (ref 22–32)
Creatinine, Ser: 1.26 mg/dL — ABNORMAL HIGH (ref 0.44–1.00)
GFR calc Af Amer: 48 mL/min — ABNORMAL LOW (ref >60)
GFR calc non Af Amer: 41 mL/min — ABNORMAL LOW (ref >60)
Glucose, Bld: 109 mg/dL — ABNORMAL HIGH (ref 65–99)
Potassium: 3.9 mmol/L (ref 3.5–5.1)
SODIUM: 134 mmol/L — AB (ref 135–145)
Total Bilirubin: 0.7 mg/dL (ref 0.3–1.2)
Total Protein: 7.2 g/dL (ref 6.5–8.1)

## 2017-01-25 LAB — URINALYSIS, COMPLETE (UACMP) WITH MICROSCOPIC
BILIRUBIN URINE: NEGATIVE
Glucose, UA: 50 mg/dL — AB
KETONES UR: NEGATIVE mg/dL
Leukocytes, UA: NEGATIVE
NITRITE: NEGATIVE
PH: 5 (ref 5.0–8.0)
Protein, ur: 100 mg/dL — AB
Specific Gravity, Urine: 1.01 (ref 1.005–1.030)

## 2017-01-25 LAB — GLUCOSE, CAPILLARY
GLUCOSE-CAPILLARY: 113 mg/dL — AB (ref 65–99)
GLUCOSE-CAPILLARY: 66 mg/dL (ref 65–99)
Glucose-Capillary: 183 mg/dL — ABNORMAL HIGH (ref 65–99)
Glucose-Capillary: 239 mg/dL — ABNORMAL HIGH (ref 65–99)

## 2017-01-25 LAB — CREATININE, SERUM
Creatinine, Ser: 1.2 mg/dL — ABNORMAL HIGH (ref 0.44–1.00)
GFR calc Af Amer: 51 mL/min — ABNORMAL LOW (ref >60)
GFR calc non Af Amer: 44 mL/min — ABNORMAL LOW (ref >60)

## 2017-01-25 LAB — LACTIC ACID, PLASMA
LACTIC ACID, VENOUS: 2.9 mmol/L — AB (ref 0.5–1.9)
LACTIC ACID, VENOUS: 2 mmol/L — AB (ref 0.5–1.9)
Lactic Acid, Venous: 1.6 mmol/L (ref 0.5–1.9)

## 2017-01-25 MED ORDER — KCL IN DEXTROSE-NACL 20-5-0.45 MEQ/L-%-% IV SOLN
Freq: Once | INTRAVENOUS | Status: DC
  Filled 2017-01-25: qty 1000

## 2017-01-25 MED ORDER — ASPIRIN EC 81 MG PO TBEC
81.0000 mg | DELAYED_RELEASE_TABLET | Freq: Every day | ORAL | Status: DC
  Administered 2017-01-26: 10:00:00 81 mg via ORAL
  Filled 2017-01-25: qty 1

## 2017-01-25 MED ORDER — PANTOPRAZOLE SODIUM 40 MG PO TBEC
40.0000 mg | DELAYED_RELEASE_TABLET | Freq: Every day | ORAL | Status: DC
  Administered 2017-01-26: 10:00:00 40 mg via ORAL
  Filled 2017-01-25: qty 1

## 2017-01-25 MED ORDER — HEPARIN SODIUM (PORCINE) 5000 UNIT/ML IJ SOLN
5000.0000 [IU] | Freq: Three times a day (TID) | INTRAMUSCULAR | Status: DC
  Administered 2017-01-25 – 2017-01-26 (×2): 5000 [IU] via SUBCUTANEOUS
  Filled 2017-01-25 (×2): qty 1

## 2017-01-25 MED ORDER — ACETAMINOPHEN 650 MG RE SUPP: 650.0000 mg | Freq: Four times a day (QID) | RECTAL | Status: DC | PRN

## 2017-01-25 MED ORDER — LISINOPRIL 20 MG PO TABS
40.0000 mg | ORAL_TABLET | Freq: Every day | ORAL | Status: DC
  Administered 2017-01-26: 40 mg via ORAL
  Filled 2017-01-25: qty 2

## 2017-01-25 MED ORDER — SODIUM CHLORIDE 0.9 % IV SOLN: 250.0000 mL | INTRAVENOUS | Status: DC | PRN

## 2017-01-25 MED ORDER — VANCOMYCIN HCL IN DEXTROSE 750-5 MG/150ML-% IV SOLN
750.0000 mg | INTRAVENOUS | Status: DC
  Administered 2017-01-25: 22:00:00 750 mg via INTRAVENOUS
  Filled 2017-01-25 (×2): qty 150

## 2017-01-25 MED ORDER — ACETAMINOPHEN 325 MG PO TABS: 650.0000 mg | ORAL_TABLET | Freq: Four times a day (QID) | ORAL | Status: DC | PRN

## 2017-01-25 MED ORDER — SIMVASTATIN 20 MG PO TABS
40.0000 mg | ORAL_TABLET | Freq: Every day | ORAL | Status: DC
  Administered 2017-01-25: 40 mg via ORAL
  Filled 2017-01-25: qty 2

## 2017-01-25 MED ORDER — DEXTROSE 5 % IV SOLN
2.0000 g | Freq: Two times a day (BID) | INTRAVENOUS | Status: DC
  Administered 2017-01-25: 2 g via INTRAVENOUS
  Filled 2017-01-25 (×3): qty 2

## 2017-01-25 MED ORDER — ONDANSETRON HCL 4 MG PO TABS: 4.0000 mg | ORAL_TABLET | Freq: Four times a day (QID) | ORAL | Status: DC | PRN

## 2017-01-25 MED ORDER — KCL IN DEXTROSE-NACL 20-5-0.9 MEQ/L-%-% IV SOLN
INTRAVENOUS | Status: DC
  Administered 2017-01-25 – 2017-01-26 (×2): via INTRAVENOUS
  Filled 2017-01-25 (×3): qty 1000

## 2017-01-25 MED ORDER — INSULIN ASPART 100 UNIT/ML ~~LOC~~ SOLN
0.0000 [IU] | Freq: Every day | SUBCUTANEOUS | Status: DC
Start: 2017-01-25 — End: 2017-01-26

## 2017-01-25 MED ORDER — CEFEPIME HCL 2 G IJ SOLR
2.0000 g | Freq: Once | INTRAMUSCULAR | Status: AC
  Administered 2017-01-25: 2 g via INTRAVENOUS
  Filled 2017-01-25: qty 2

## 2017-01-25 MED ORDER — SODIUM CHLORIDE 0.9% FLUSH
3.0000 mL | Freq: Two times a day (BID) | INTRAVENOUS | Status: DC
  Administered 2017-01-25: 3 mL via INTRAVENOUS

## 2017-01-25 MED ORDER — AMLODIPINE BESYLATE 10 MG PO TABS
10.0000 mg | ORAL_TABLET | Freq: Every day | ORAL | Status: DC
  Administered 2017-01-26: 10 mg via ORAL
  Filled 2017-01-25: qty 1

## 2017-01-25 MED ORDER — VANCOMYCIN HCL IN DEXTROSE 1-5 GM/200ML-% IV SOLN
1000.0000 mg | Freq: Once | INTRAVENOUS | Status: AC
  Administered 2017-01-25: 1000 mg via INTRAVENOUS
  Filled 2017-01-25: qty 200

## 2017-01-25 MED ORDER — ALBUTEROL SULFATE (2.5 MG/3ML) 0.083% IN NEBU: 2.5000 mg | INHALATION_SOLUTION | RESPIRATORY_TRACT | Status: DC | PRN

## 2017-01-25 MED ORDER — SODIUM CHLORIDE 0.9% FLUSH: 3.0000 mL | INTRAVENOUS | Status: DC | PRN

## 2017-01-25 MED ORDER — DONEPEZIL HCL 5 MG PO TABS
5.0000 mg | ORAL_TABLET | Freq: Every day | ORAL | Status: DC
  Administered 2017-01-25: 21:00:00 5 mg via ORAL
  Filled 2017-01-25 (×2): qty 1

## 2017-01-25 MED ORDER — SODIUM CHLORIDE 0.9 % IV BOLUS (SEPSIS)
500.0000 mL | Freq: Once | INTRAVENOUS | Status: AC
  Administered 2017-01-25: 500 mL via INTRAVENOUS

## 2017-01-25 MED ORDER — METOPROLOL SUCCINATE ER 50 MG PO TB24
50.0000 mg | ORAL_TABLET | Freq: Every day | ORAL | Status: DC
Start: 2017-01-25 — End: 2017-01-26
  Administered 2017-01-26: 10:00:00 50 mg via ORAL
  Filled 2017-01-25: qty 1

## 2017-01-25 MED ORDER — SENNOSIDES-DOCUSATE SODIUM 8.6-50 MG PO TABS: 1.0000 | ORAL_TABLET | Freq: Every evening | ORAL | Status: DC | PRN

## 2017-01-25 MED ORDER — DEXTROSE 50 % IV SOLN
1.0000 | Freq: Once | INTRAVENOUS | Status: AC
  Administered 2017-01-25: 50 mL via INTRAVENOUS

## 2017-01-25 MED ORDER — DEXTROSE 50 % IV SOLN
INTRAVENOUS | Status: AC
  Filled 2017-01-25: qty 50

## 2017-01-25 MED ORDER — INSULIN ASPART 100 UNIT/ML ~~LOC~~ SOLN
0.0000 [IU] | Freq: Three times a day (TID) | SUBCUTANEOUS | Status: DC
  Administered 2017-01-25: 3 [IU] via SUBCUTANEOUS
  Filled 2017-01-25: qty 1

## 2017-01-25 MED ORDER — ONDANSETRON HCL 4 MG/2ML IJ SOLN: 4.0000 mg | Freq: Four times a day (QID) | INTRAMUSCULAR | Status: DC | PRN

## 2017-01-25 NOTE — ED Notes (Signed)
Rectal thermometer placed by this RN and Carney Harder, EDT.

## 2017-01-25 NOTE — ED Notes (Signed)
Pt's CBG 66, MD aware, VORB D50 obtained. Pt remains alert, no change in cognitive status from arrival.

## 2017-01-25 NOTE — H&P (Addendum)
[place] at [place] NAME: Alyssa [name]    MR#:  [id number]  DATE OF BIRTH:  March 28, 1941  DATE OF ADMISSION:  01/25/2017  PRIMARY CARE PHYSICIAN: Glean Hess, MD   REQUESTING/REFERRING PHYSICIAN: Merlyn Lot, MD  CHIEF COMPLAINT:   Chief Complaint  Patient presents with  . Altered Mental Status  . Hypoglycemia   Unresponsive and hypoglycemia today. HISTORY OF PRESENT ILLNESS:  Alyssa [name]  is a 76 y.o. female with a known history of Hypertension, diabetes, hyperlipidemia and CKD stage III. The patient was found unresponsive on the floor by her son today. EMS was called immediately and found the patient's blood sugar was 13. She was given D50 1 amp and peanut butter some H. Glucose was up to 230. The patient is confused and cannot give history. She was found hypoglycemia in the ED again and started D50 IV infusion. She is also hypothermia. The patient said that she used insulin without eating this morning.  PAST MEDICAL HISTORY:   Past Medical History:  Diagnosis Date  . Chronic systolic CHF (congestive heart failure) ([place])   . CKD (chronic kidney disease), stage III   . Essential hypertension   . Hyperlipidemia   . Hypertensive Non-ischemic Cardiomyopathy    a. 2010 Echo: EF 55%;  b. 2010 borderline MV w/ apical thinning; c. 2010 Cath: LCX 50p->Med Rx;  d. 09/2014 Echo: EF 35-40% w/ Sev LVH and glob HK;  e. 09/2014 MV: EF 30-44%, small fixed apical defect (breast attenuation), no ischemia.  . Hypokalemia   . Type II diabetes mellitus ([place])     PAST SURGICAL HISTORY:   Past Surgical History:  Procedure Laterality Date  . ABDOMINAL HYSTERECTOMY    . CHOLECYSTECTOMY    . TONSILLECTOMY      SOCIAL HISTORY:   Social History  Substance Use Topics  . Smoking status: Current Every Day Smoker    Packs/day: 0.25    Years: 10.00    Types: Cigarettes    Last attempt to quit: 09/11/1982  . Smokeless tobacco: Current User     Types: Chew  . Alcohol use 0.6 oz/week    1 Cans of beer per week    FAMILY HISTORY:   Family History  Problem Relation Age of Onset  . Diabetes Mother   . Hypertension Mother   . Diabetes Father   . Hypertension Father   . Breast cancer Neg Hx     DRUG ALLERGIES:  No Known Allergies  REVIEW OF SYSTEMS:   Review of Systems  Constitutional: Positive for malaise/fatigue. Negative for chills and fever.  HENT: Negative for sore throat.   Eyes: Negative for blurred vision and double vision.  Respiratory: Negative for cough, shortness of breath, wheezing and stridor.   Cardiovascular: Negative for chest pain and leg swelling.  Gastrointestinal: Negative for abdominal pain, blood in stool, diarrhea, heartburn, melena, nausea and vomiting.  Genitourinary: Negative for dysuria, frequency and hematuria.  Musculoskeletal: Negative for back pain.  Skin: Negative for itching and rash.  Neurological: Positive for weakness. Negative for dizziness, focal weakness, loss of consciousness and headaches.  Psychiatric/Behavioral: Negative for depression. The patient is not nervous/anxious.     MEDICATIONS AT HOME:   Prior to Admission medications   Medication Sig Start Date End Date Taking? Authorizing Provider  acetaminophen (TYLENOL) 500 MG tablet Take 500 mg by mouth every 6 (six) hours as needed for mild pain.     [provider]  amLODipine (NORVASC) 10 MG tablet TAKE (1) TABLET BY MOUTH EVERY DAY 12/16/16   Glean Hess, MD  aspirin EC 81 MG tablet Take 81 mg by mouth daily.    [provider]  Blood Glucose Monitoring Suppl (TRUETRACK BLOOD GLUCOSE) w/Device KIT 1 each by Does not apply route daily. Patient not taking: Reported on 01/20/2017 12/06/15   Glean Hess, MD  donepezil (ARICEPT) 5 MG tablet TAKE ONE TABLET AT BEDTIME. 09/17/16   Glean Hess, MD  insulin detemir (LEVEMIR) 100 UNIT/ML injection Inject 20 Units into the skin daily.      [provider]  Insulin Syringe-Needle U-100 (GLOBAL INJECT EASE INSULIN SYR) 30G X 1/2" 0.3 ML MISC USE ONCE A DAY FOR INSULIN 12/16/16   Glean Hess, MD  lisinopril (PRINIVIL,ZESTRIL) 40 MG tablet TAKE ONE (1) TABLET BY MOUTH ONCE DAILY 12/16/16   Glean Hess, MD  metFORMIN (GLUCOPHAGE) 500 MG tablet Take 1 tablet (500 mg total) by mouth 2 (two) times daily with a meal. 12/16/16   Glean Hess, MD  metoprolol succinate (TOPROL-XL) 50 MG 24 hr tablet TAKE ONE (1) TABLET BY MOUTH ONCE DAILY 12/16/16   Glean Hess, MD  omeprazole (PRILOSEC) 40 MG capsule TAKE (1) CAPSULE BY MOUTH TWICE DAILY 08/22/16   Glean Hess, MD  potassium chloride SA (K-DUR,KLOR-CON) 20 MEQ tablet TAKE (1) TABLET BY MOUTH EVERY DAY 06/11/16   Glean Hess, MD  simvastatin (ZOCOR) 40 MG tablet Take 1 tablet (40 mg total) by mouth at bedtime. 12/16/16   Glean Hess, MD      VITAL SIGNS:  Blood pressure (!) 157/69, pulse 61, temperature (!) 96.1 F (35.6 C), resp. rate 15, height _0  (1.702 m), weight 137 lb (62.1 kg), SpO2 98 %.  PHYSICAL EXAMINATION:  Physical Exam  GENERAL:  76 y.o.-year-old patient lying in the bed with no acute distress.  EYES: Pupils equal, round, reactive to light and accommodation. No scleral icterus. Extraocular muscles intact.  HEENT: Head atraumatic, normocephalic. Oropharynx and nasopharynx clear.  NECK:  Supple, no jugular venous distention. No thyroid enlargement, no tenderness.  LUNGS: Normal breath sounds bilaterally, no wheezing, rales,rhonchi or crepitation. No use of accessory muscles of respiration.  CARDIOVASCULAR: S1, S2 normal. No murmurs, rubs, or gallops.  ABDOMEN: Soft, nontender, nondistended. Bowel sounds present. No organomegaly or mass.  EXTREMITIES: No pedal edema, cyanosis, or clubbing.  NEUROLOGIC: Cranial nerves II through XII are intact. Muscle strength 5/5 in all extremities. Sensation intact. Gait not checked.    PSYCHIATRIC: The patient is alert and oriented x 2. She looks confused. SKIN: No obvious rash, lesion, or ulcer.   LABORATORY PANEL:   CBC  Recent Labs Lab 01/25/17 1234  WBC 7.1  HGB 11.1*  HCT 32.7*  PLT 228   ------------------------------------------------------------------------------------------------------------------  Chemistries   Recent Labs Lab 01/25/17 1234  NA 134*  K 3.9  CL 101  CO2 24  GLUCOSE 109*  BUN 15  CREATININE 1.26*  CALCIUM 8.9  AST 38  ALT 14  ALKPHOS 51  BILITOT 0.7   ------------------------------------------------------------------------------------------------------------------  Cardiac Enzymes No results for input(s): TROPONINI in the last 168 hours. ------------------------------------------------------------------------------------------------------------------  RADIOLOGY:  Dg Chest 2 View  Result Date: 01/25/2017 CLINICAL DATA:  History of congestive heart failure.  Smoker. EXAM: CHEST  2 VIEW COMPARISON:  10/27/2014 FINDINGS: The cardiac silhouette is mildly enlarged. Calcific atherosclerotic disease and tortuosity of the aorta. Mediastinal contours appear intact. There is no evidence  of focal airspace consolidation, pleural effusion or pneumothorax. Chronic coarsening of the interstitial markings. Osseous structures are without acute abnormality. Soft tissues are grossly normal. IMPRESSION: Chronic coarsening of the interstitial markings. Calcific atherosclerotic disease and tortuosity of the aorta. Mild enlargement of the cardiac silhouette. Electronically Signed   By: Fidela Salisbury M.D.   On: 01/25/2017 13:30   Ct Head Wo Contrast  Result Date: 01/25/2017 CLINICAL DATA:  Hypoglycemia and altered mental status. EXAM: CT HEAD WITHOUT CONTRAST TECHNIQUE: Contiguous axial images were obtained from the base of the skull through the vertex without intravenous contrast. COMPARISON:  10/27/2014 FINDINGS: Brain: No evidence of acute  infarction, hemorrhage, hydrocephalus, extra-axial collection or mass lesion/mass effect. Possible age-indeterminate lacunar infarct versus dilated perivascular spaces in bilateral basal ganglia. Brain parenchymal volume loss and periventricular microangiopathy, moderate. Vascular: Vascular calcifications at the skullbase. Skull: Normal. Negative for fracture or focal lesion. Sinuses/Orbits: No acute finding. Other: None. IMPRESSION: Age-indeterminate lacunar infarcts versus dilated perivascular spaces in bilateral basal ganglia. Otherwise no evidence of acute intracranial abnormality. Atrophy, chronic microvascular disease. Electronically Signed   By: Fidela Salisbury M.D.   On: 01/25/2017 13:24      IMPRESSION AND PLAN:   Hypoglycemia. The patient will be placed for observation. Continue D5 normal saline with potassium IV, Accu-Chek and sliding scale. Hold Levemir.  Lactic acidosis. IV fluid support. Follow-up lactic acid level.  Hypothermia. warm blanket.  Hypertension. Continue home hypertension medication.  All the records are reviewed and case discussed with ED provider. Management plans discussed with the patient, family and they are in agreement.  CODE STATUS: Full code  TOTAL TIME TAKING CARE OF THIS PATIENT: 46 minutes.    Demetrios Loll M.D on 01/25/2017 at 2:05 PM  Between 7am to 6pm - Pager - [phone]  After 6pm go to [email or web address] - Proofreader  [facility]  Office  [phone]  CC: Primary care physician; Glean Hess, MD   Note: This dictation was prepared with Dragon dictation along with smaller phrase technology. Any transcriptional errors that result from this process are unintentional.

## 2017-01-25 NOTE — Progress Notes (Signed)
Pharmacy Antibiotic Note  Alyssa [name] is a 76 y.o. female admitted on 01/25/2017 with pneumonia.  Pharmacy has been consulted for pneumonia dosing.  Plan: Cefepime 2 g IV q12h  Vancomycin 1000 mg IV given in ED. Will follow with vancomycin 750 mg IV q24h (9 hour stack) VT goal 15-20 mcg/mL VT on 7/3 at 2230  Kinetics:  Ke: 0.035 Half-life: 20 hrs Vd: 43 L Cmin (estimated): 15 mcg/mL  Height: 5\' 7"  (170.2 cm) Weight: 137 lb (62.1 kg) IBW/kg (Calculated) : 61.6  Temp (24hrs), Avg:95.8 F (35.4 C), Min:94 F (34.4 C), Max:97.2 F (36.2 C)   Recent Labs Lab 01/25/17 1234  WBC 7.1  CREATININE 1.26*  LATICACIDVEN 2.9*    Estimated Creatinine Clearance: 38.7 mL/min (A) (by C-G formula based on SCr of 1.26 mg/dL (H)).    No Known Allergies  Thank you for allowing pharmacy to be a part of this patient's care.  Lenis Noon, PharmD Clinical Pharmacist 01/25/2017 3:06 PM

## 2017-01-25 NOTE — ED Notes (Signed)
Pt laying in bed with family at bedside eating a sand which at this time. Pt is noted to be alert, no acute distress noted. Will continue to monitor for further patient needs. Explained will be giving her abx at this time, pt and family state understanding.

## 2017-01-25 NOTE — ED Notes (Signed)
Pt taken to CT and X-ray at this time.

## 2017-01-25 NOTE — ED Notes (Signed)
Date and time results received: 01/25/17  1:32 PM    Test: Lactic Critical Value: 2.9  Name of Provider Notified: Dr. Quentin Cornwall  Orders Received? Or Actions Taken?: Critical Value Acknowledged

## 2017-01-25 NOTE — ED Notes (Addendum)
Pt placed on [facility]. MD aware, pt rectal temp 94.0.

## 2017-01-25 NOTE — ED Triage Notes (Signed)
Pt presents to ED via [facility] with c/o hypoglycemia and altered mental status. Per EMS pt was found unresponsive on floor by son  This morning, initial CBG on scene was 13, pt was given 1amp D50 and ate half a peanut butter sandwhich, CBG 207. Upon arrival CBG 113. Pt states she took his insulin this AM but is unsure how much she took, pt presents with 20g to L wrist started by EMS. Pt noted to be hypothermic on arrival, MD made aware.

## 2017-01-25 NOTE — ED Notes (Signed)
Pt remains resting in bed at this time with family at bedside. Will continue to monitor for further patient needs. 2nd abx hung per MD order. Explained delay to patient and family. Denies any needs at this time.

## 2017-01-25 NOTE — ED Provider Notes (Signed)
[facility] Emergency Department Provider Note    First MD Initiated Contact with Patient 01/25/17 1204     (approximate)  I have reviewed the triage vital signs and the nursing notes.   HISTORY  Chief Complaint hypoglycemia   HPI Alyssa [name] is a 76 y.o. female presents for evaluation of hypoglycemic episode. Patient was found unresponsive on the floor by her son. EMS was called immediately. Blood glucose was checked and it was 13.Patient was given an amp of D50 and peanut butter sandwich and glucose came up to 2:30. Patient arrives seems very confused and does not remember anything that happened this morning. Does not recall taking any medications this morning. Patient is found to be hypothermic. She is ill-appearing. Mild tachypnea. Moving all extremities.   Past Medical History:  Diagnosis Date  . Chronic systolic CHF (congestive heart failure) ([place])   . CKD (chronic kidney disease), stage III   . Essential hypertension   . Hyperlipidemia   . Hypertensive Non-ischemic Cardiomyopathy    a. 2010 Echo: EF 55%;  b. 2010 borderline MV w/ apical thinning; c. 2010 Cath: LCX 50p->Med Rx;  d. 09/2014 Echo: EF 35-40% w/ Sev LVH and glob HK;  e. 09/2014 MV: EF 30-44%, small fixed apical defect (breast attenuation), no ischemia.  . Hypokalemia   . Type II diabetes mellitus (HCC)    Family History  Problem Relation Age of Onset  . Diabetes Mother   . Hypertension Mother   . Diabetes Father   . Hypertension Father   . Breast cancer Neg Hx    Past Surgical History:  Procedure Laterality Date  . ABDOMINAL HYSTERECTOMY    . CHOLECYSTECTOMY    . TONSILLECTOMY     Patient Active Problem List   Diagnosis Date Noted  . Hypoglycemia 01/25/2017  . Cardiomyopathy due to hypertension, without heart failure ([place]) 09/23/2016  . Cardiac murmur 06/03/2016  . Acute GI bleeding 03/05/2016  . Type 2 diabetes mellitus with stage 3 chronic kidney disease, with  long-term current use of insulin ([place]) 12/06/2015  . Hyperlipidemia associated with type 2 diabetes mellitus ([place])   . Alzheimer's dementia without behavioral disturbance 03/07/2015  . Coronary artery disease involving native coronary artery of native heart without angina pectoris 12/02/2014  . Allergic rhinitis 11/15/2014  . Essential hypertension 11/15/2014  . Arthritis, degenerative 11/15/2014  . Hypokalemia 11/15/2014  . H/O gastrointestinal hemorrhage 03/28/2011      Prior to Admission medications   Medication Sig Start Date End Date Taking? Authorizing Provider  amLODipine (NORVASC) 10 MG tablet TAKE (1) TABLET BY MOUTH EVERY DAY 12/16/16  Yes Glean Hess, MD  aspirin EC 81 MG tablet Take 81 mg by mouth daily.   Yes [provider]  donepezil (ARICEPT) 5 MG tablet TAKE ONE TABLET AT BEDTIME. 09/17/16  Yes Glean Hess, MD  insulin detemir (LEVEMIR) 100 UNIT/ML injection Inject 20 Units into the skin daily.    Yes [provider]  lisinopril (PRINIVIL,ZESTRIL) 40 MG tablet TAKE ONE (1) TABLET BY MOUTH ONCE DAILY 12/16/16  Yes Glean Hess, MD  metFORMIN (GLUCOPHAGE) 500 MG tablet Take 1 tablet (500 mg total) by mouth 2 (two) times daily with a meal. 12/16/16  Yes Glean Hess, MD  metoprolol succinate (TOPROL-XL) 50 MG 24 hr tablet TAKE ONE (1) TABLET BY MOUTH ONCE DAILY 12/16/16  Yes Glean Hess, MD  omeprazole (PRILOSEC) 40 MG capsule TAKE (1) CAPSULE BY MOUTH TWICE DAILY 08/22/16  Yes Glean Hess, MD  potassium chloride SA (K-DUR,KLOR-CON) 20 MEQ tablet TAKE (1) TABLET BY MOUTH EVERY DAY 06/11/16  Yes Glean Hess, MD  simvastatin (ZOCOR) 40 MG tablet Take 1 tablet (40 mg total) by mouth at bedtime. 12/16/16  Yes Glean Hess, MD  acetaminophen (TYLENOL) 500 MG tablet Take 500 mg by mouth every 6 (six) hours as needed for mild pain.     [provider]  Blood Glucose Monitoring Suppl (TRUETRACK BLOOD GLUCOSE) w/Device  KIT 1 each by Does not apply route daily. Patient not taking: Reported on 01/20/2017 12/06/15   Glean Hess, MD  Insulin Syringe-Needle U-100 (GLOBAL INJECT EASE INSULIN SYR) 30G X 1/2" 0.3 ML MISC USE ONCE A DAY FOR INSULIN 12/16/16   Glean Hess, MD    Allergies Patient has no known allergies.    Social History Social History  Substance Use Topics  . Smoking status: Current Every Day Smoker    Packs/day: 0.25    Years: 10.00    Types: Cigarettes    Last attempt to quit: 09/11/1982  . Smokeless tobacco: Current User    Types: Chew  . Alcohol use 0.6 oz/week    1 Cans of beer per week    Review of Systems Patient denies headaches, rhinorrhea, blurry vision, numbness, shortness of breath, chest pain, edema, cough, abdominal pain, nausea, vomiting, diarrhea, dysuria, fevers, rashes or hallucinations unless otherwise stated above in HPI. ____________________________________________   PHYSICAL EXAM:  VITAL SIGNS: Vitals:   01/25/17 1537 01/25/17 1548  BP:  (!) 171/62  Pulse: 69 65  Resp: 20   Temp: 98 F (36.7 C)     Constitutional: Alert confused, slow to answer questions ill appearing but in no acute distress. Eyes: Conjunctivae are normal.  Head: Atraumatic. Nose: No congestion/rhinnorhea. Mouth/Throat: Mucous membranes are moist.   Neck: No stridor. Painless ROM.  Cardiovascular: Normal rate, regular rhythm. Grossly normal heart sounds.  Good peripheral circulation. Respiratory: mild tachypnea.  No retractions. Lungs CTAB. Gastrointestinal: Soft and nontender. No distention. No abdominal bruits. No CVA tenderness. Musculoskeletal: No lower extremity tenderness nor edema.  No joint effusions. Neurologic:   No gross focal neurologic deficits are appreciated. No facial droop Skin:  Skin is warm, dry and intact. No rash noted. Psychiatric: Mood and affect are normal. Speech and behavior are normal. ____________________________________________   LABS (all  labs ordered are listed, but only abnormal results are displayed)  Results for orders placed or performed during the hospital encounter of 01/25/17 (from the past 24 hour(s))  Glucose, capillary     Status: Abnormal   Collection Time: 01/25/17 12:13 PM  Result Value Ref Range   Glucose-Capillary 113 (H) 65 - 99 mg/dL  CBC with Differential/Platelet     Status: Abnormal   Collection Time: 01/25/17 12:34 PM  Result Value Ref Range   WBC 7.1 3.6 - 11.0 K/uL   RBC 3.72 (L) 3.80 - 5.20 MIL/uL   Hemoglobin 11.1 (L) 12.0 - 16.0 g/dL   HCT 32.7 (L) 35.0 - 47.0 %   MCV 87.9 80.0 - 100.0 fL   MCH 29.8 26.0 - 34.0 pg   MCHC 33.9 32.0 - 36.0 g/dL   RDW 14.3 11.5 - 14.5 %   Platelets 228 150 - 440 K/uL   Neutrophils Relative % 77 %   Neutro Abs 5.5 1.4 - 6.5 K/uL   Lymphocytes Relative 14 %   Lymphs Abs 1.0 1.0 - 3.6 K/uL   Monocytes Relative  8 %   Monocytes Absolute 0.5 0.2 - 0.9 K/uL   Eosinophils Relative 1 %   Eosinophils Absolute 0.0 0 - 0.7 K/uL   Basophils Relative 0 %   Basophils Absolute 0.0 0 - 0.1 K/uL  Comprehensive metabolic panel     Status: Abnormal   Collection Time: 01/25/17 12:34 PM  Result Value Ref Range   Sodium 134 (L) 135 - 145 mmol/L   Potassium 3.9 3.5 - 5.1 mmol/L   Chloride 101 101 - 111 mmol/L   CO2 24 22 - 32 mmol/L   Glucose, Bld 109 (H) 65 - 99 mg/dL   BUN 15 6 - 20 mg/dL   Creatinine, Ser 1.26 (H) 0.44 - 1.00 mg/dL   Calcium 8.9 8.9 - 10.3 mg/dL   Total Protein 7.2 6.5 - 8.1 g/dL   Albumin 3.5 3.5 - 5.0 g/dL   AST 38 15 - 41 U/L   ALT 14 14 - 54 U/L   Alkaline Phosphatase 51 38 - 126 U/L   Total Bilirubin 0.7 0.3 - 1.2 mg/dL   GFR calc non Af Amer 41 (L) >60 mL/min   GFR calc Af Amer 48 (L) >60 mL/min   Anion gap 9 5 - 15  Urinalysis, Complete w Microscopic     Status: Abnormal   Collection Time: 01/25/17 12:34 PM  Result Value Ref Range   Color, Urine YELLOW (A) YELLOW   APPearance CLEAR (A) CLEAR   Specific Gravity, Urine 1.010 1.005 - 1.030     pH 5.0 5.0 - 8.0   Glucose, UA 50 (A) NEGATIVE mg/dL   Hgb urine dipstick MODERATE (A) NEGATIVE   Bilirubin Urine NEGATIVE NEGATIVE   Ketones, ur NEGATIVE NEGATIVE mg/dL   Protein, ur 100 (A) NEGATIVE mg/dL   Nitrite NEGATIVE NEGATIVE   Leukocytes, UA NEGATIVE NEGATIVE   RBC / HPF 6-30 0 - 5 RBC/hpf   WBC, UA 0-5 0 - 5 WBC/hpf   Bacteria, UA RARE (A) NONE SEEN   Squamous Epithelial / LPF 0-5 (A) NONE SEEN  Lactic acid, plasma     Status: Abnormal   Collection Time: 01/25/17 12:34 PM  Result Value Ref Range   Lactic Acid, Venous 2.9 (HH) 0.5 - 1.9 mmol/L  Glucose, capillary     Status: None   Collection Time: 01/25/17  1:38 PM  Result Value Ref Range   Glucose-Capillary 66 65 - 99 mg/dL  Glucose, capillary     Status: Abnormal   Collection Time: 01/25/17  2:21 PM  Result Value Ref Range   Glucose-Capillary 183 (H) 65 - 99 mg/dL  Lactic acid, plasma     Status: Abnormal   Collection Time: 01/25/17  3:52 PM  Result Value Ref Range   Lactic Acid, Venous 2.0 (HH) 0.5 - 1.9 mmol/L  Creatinine, serum     Status: Abnormal   Collection Time: 01/25/17  3:52 PM  Result Value Ref Range   Creatinine, Ser 1.20 (H) 0.44 - 1.00 mg/dL   GFR calc non Af Amer 44 (L) >60 mL/min   GFR calc Af Amer 51 (L) >60 mL/min  Glucose, capillary     Status: Abnormal   Collection Time: 01/25/17  4:54 PM  Result Value Ref Range   Glucose-Capillary 239 (H) 65 - 99 mg/dL   ____________________________________________  EKG My review and personal interpretation at Time: 12:09   Indication: syncope  Rate: 65  Rhythm: sinus Axis: normal Other: normal intervals, no st elevations ____________________________________________  RADIOLOGY  I personally  reviewed all radiographic images ordered to evaluate for the above acute complaints and reviewed radiology reports and findings.  These findings were personally discussed with the patient.  Please see medical record for radiology  report.  ____________________________________________   PROCEDURES  Procedure(s) performed:  Procedures    Critical Care performed: yes CRITICAL CARE Performed by: Merlyn Lot   Total critical care time: 45 minutes  Critical care time was exclusive of separately billable procedures and treating other patients.  Critical care was necessary to treat or prevent imminent or life-threatening deterioration.  Critical care was time spent personally by me on the following activities: development of treatment plan with patient and/or surrogate as well as nursing, discussions with consultants, evaluation of patient's response to treatment, examination of patient, obtaining history from patient or surrogate, ordering and performing treatments and interventions, ordering and review of laboratory studies, ordering and review of radiographic studies, pulse oximetry and re-evaluation of patient's condition.   ____________________________________________   INITIAL IMPRESSION / ASSESSMENT AND PLAN / ED COURSE  Pertinent labs & imaging results that were available during my care of the patient were reviewed by me and considered in my medical decision making (see chart for details).  DDX: Dehydration, sepsis, pna, uti, hypoglycemia, cva, drug effect, withdrawal, encephalitis   Alyssa [name] is a 76 y.o. who presents to the ED with unresponsive episode with profound hypoglycemia. Patient is still mildly cephalopathic. His hypothermic but well-perfused. Blood work will be ordered to evaluate for any evidence of a KI electro-led abnormality or infectious process. CT imaging of the head will be ordered to evaluate for evidence of traumatic injury. Chest x-ray ordered for any evidence of pneumonia she does have mild tachypnea.  The patient will be placed on continuous pulse oximetry and telemetry for monitoring.  Laboratory evaluation will be sent to evaluate for the above complaints.      Clinical Course as of Jan 26 1743  Sat Jan 25, 2017  1347 Blood sugar rechecked and patient down in the 60s. Will give additional dose of D50 and start patient on a D5 infusion.  The patient will be given antibiotics for concern for pneumonia given coarse interstitial markings and mild tachypnea with hypokalemia concerning for sepsis. Mild lactic acidosis as well. Do feel patient will require admission for further evaluation and management.  Have discussed with the patient and available family all diagnostics and treatments performed thus far and all questions were answered to the best of my ability. The patient demonstrates understanding and agreement with plan.   [PR]    Clinical Course User Index [PR] Merlyn Lot, MD     ____________________________________________   FINAL CLINICAL IMPRESSION(S) / ED DIAGNOSES  Final diagnoses:  Syncope, unspecified syncope type  Hypoglycemia  Lactic acidosis  SIRS (systemic inflammatory response syndrome) ([place])      NEW MEDICATIONS STARTED DURING THIS VISIT:  Current Discharge Medication List       Note:  This document was prepared using Dragon voice recognition software and may include unintentional dictation errors.    Merlyn Lot, MD 01/25/17 1745

## 2017-01-26 DIAGNOSIS — E162 Hypoglycemia, unspecified: Secondary | ICD-10-CM | POA: Diagnosis not present

## 2017-01-26 DIAGNOSIS — T68XXXA Hypothermia, initial encounter: Secondary | ICD-10-CM | POA: Diagnosis not present

## 2017-01-26 DIAGNOSIS — E119 Type 2 diabetes mellitus without complications: Secondary | ICD-10-CM | POA: Diagnosis not present

## 2017-01-26 DIAGNOSIS — E872 Acidosis: Secondary | ICD-10-CM | POA: Diagnosis not present

## 2017-01-26 DIAGNOSIS — E11649 Type 2 diabetes mellitus with hypoglycemia without coma: Secondary | ICD-10-CM | POA: Diagnosis not present

## 2017-01-26 LAB — BASIC METABOLIC PANEL
Anion gap: 4 — ABNORMAL LOW (ref 5–15)
BUN: 11 mg/dL (ref 6–20)
CALCIUM: 8.6 mg/dL — AB (ref 8.9–10.3)
CO2: 25 mmol/L (ref 22–32)
Chloride: 110 mmol/L (ref 101–111)
Creatinine, Ser: 1.21 mg/dL — ABNORMAL HIGH (ref 0.44–1.00)
GFR calc Af Amer: 50 mL/min — ABNORMAL LOW (ref >60)
GFR, EST NON AFRICAN AMERICAN: 43 mL/min — AB (ref >60)
Glucose, Bld: 100 mg/dL — ABNORMAL HIGH (ref 65–99)
POTASSIUM: 3.8 mmol/L (ref 3.5–5.1)
Sodium: 139 mmol/L (ref 135–145)

## 2017-01-26 LAB — HEMOGLOBIN A1C
HEMOGLOBIN A1C: 5.4 % (ref 4.8–5.6)
MEAN PLASMA GLUCOSE: 108 mg/dL

## 2017-01-26 LAB — URINE CULTURE: Culture: NO GROWTH

## 2017-01-26 LAB — GLUCOSE, CAPILLARY
GLUCOSE-CAPILLARY: 111 mg/dL — AB (ref 65–99)
GLUCOSE-CAPILLARY: 90 mg/dL (ref 65–99)
Glucose-Capillary: 115 mg/dL — ABNORMAL HIGH (ref 65–99)

## 2017-01-26 NOTE — Progress Notes (Signed)
Discharge instructions along with home medications and follow up gone over with patient and daughter. Both verbalize that they understood instructions. No prescriptions given to patient. IV's and tele removed. Pt being discharged home on room air, no distress noted. Ammie Dalton, RN

## 2017-01-26 NOTE — Care Management Obs Status (Signed)
[place] NOTIFICATION   Patient Details  Name: Alyssa [name] MRN: [id number] Date of Birth: [birth year]   Medicare Observation Status Notification Given:  Yes [facility] letter)    Mardene Speak, RN 01/26/2017, 8:03 AM

## 2017-01-26 NOTE — Plan of Care (Signed)
Problem: Education: Goal: Ability to describe self-care measures that may prevent or decrease complications (Diabetes Survival Skills Education) will improve Outcome: Progressing Pt continues D5 NS with K at 8ml/hr. Pt required no bedtime dose of insulin.

## 2017-01-26 NOTE — Discharge Summary (Signed)
[facility] NAME: Alyssa [name]    MR#:  [id number]  DATE OF BIRTH:  1941/04/09  DATE OF ADMISSION:  01/25/2017 ADMITTING PHYSICIAN: Demetrios Loll, MD  DATE OF DISCHARGE: 01/26/2017  PRIMARY CARE PHYSICIAN: Glean Hess, MD    ADMISSION DIAGNOSIS:  Lactic acidosis [E87.2] Hypoglycemia [E16.2] SIRS (systemic inflammatory response syndrome) (HCC) [R65.10] Syncope, unspecified syncope type [R55]  DISCHARGE DIAGNOSIS:  Active Problems:   Hypoglycemia   SECONDARY DIAGNOSIS:   Past Medical History:  Diagnosis Date  . Chronic systolic CHF (congestive heart failure) ([place])   . CKD (chronic kidney disease), stage III   . Essential hypertension   . Hyperlipidemia   . Hypertensive Non-ischemic Cardiomyopathy    a. 2010 Echo: EF 55%;  b. 2010 borderline MV w/ apical thinning; c. 2010 Cath: LCX 50p->Med Rx;  d. 09/2014 Echo: EF 35-40% w/ Sev LVH and glob HK;  e. 09/2014 MV: EF 30-44%, small fixed apical defect (breast attenuation), no ischemia.  . Hypokalemia   . Type II diabetes mellitus [facility])     HOSPITAL COURSE:   76 year old female with past medical history of diabetes, nonischemic cardiomyopathy, hypertension, hyperlipidemia, chronic kidney disease stage III, chronic systolic CHF, dementia who presented to the hospital due to hypoglycemia, hypothermia.  1. Altered mental status/unresponsiveness-secondary to severe hypoglycemia. CT head was negative for acute pathology. -She is blood sugars have significantly improved and her mental status has also improved and is back to baseline.  2. Hypoglycemia-suspected to be secondary to poor by mouth intake and patient taking her insulin. Her insulin dose has not been adjusted. Patient received dextrose and also placed on a dextrose drip and her blood sugars have improved and stabilized now. She was advised not to take her metformin and insulin if she is not eating well. -I arranged home health  nursing to follow her blood sugars and diabetes at home.   3. Hypothermia-secondary to severe hypoglycemia. No clinical evidence of sepsis. Patient had a lactate that was elevated but it probably was related to the underlying hypoglycemia. -Patient's chest x-ray was negative for acute pathology or urinalysis was negative. She is clinically asymptomatic now. Hypothermia has resolved. Lactate level has also normalized. She was also given empiric IV vancomycin, cefepime but since she has no infectious source is not being discharged on any antibiotics.  4. Essential hypertension-patient will continue on Norvasc, metoprolol.  5. GERD-patient will continue omeprazole.  6. Hyperlipidemia-patient will continue her simvastatin.  DISCHARGE CONDITIONS:   Stable  CONSULTS OBTAINED:    DRUG ALLERGIES:  No Known Allergies  DISCHARGE MEDICATIONS:   Allergies as of 01/26/2017   No Known Allergies     Medication List    TAKE these medications   acetaminophen 500 MG tablet Commonly known as:  TYLENOL Take 500 mg by mouth every 6 (six) hours as needed for mild pain.   amLODipine 10 MG tablet Commonly known as:  NORVASC TAKE (1) TABLET BY MOUTH EVERY DAY   aspirin EC 81 MG tablet Take 81 mg by mouth daily.   donepezil 5 MG tablet Commonly known as:  ARICEPT TAKE ONE TABLET AT BEDTIME.   insulin detemir 100 UNIT/ML injection Commonly known as:  LEVEMIR Inject 20 Units into the skin daily.   Insulin Syringe-Needle U-100 30G X 1/2" 0.3 ML Misc Commonly known as:  GLOBAL INJECT EASE INSULIN SYR USE ONCE A DAY FOR INSULIN   lisinopril 40 MG tablet Commonly known as:  PRINIVIL,ZESTRIL  TAKE ONE (1) TABLET BY MOUTH ONCE DAILY   metFORMIN 500 MG tablet Commonly known as:  GLUCOPHAGE Take 1 tablet (500 mg total) by mouth 2 (two) times daily with a meal.   metoprolol succinate 50 MG 24 hr tablet Commonly known as:  TOPROL-XL TAKE ONE (1) TABLET BY MOUTH ONCE DAILY   omeprazole 40 MG  capsule Commonly known as:  PRILOSEC TAKE (1) CAPSULE BY MOUTH TWICE DAILY   potassium chloride SA 20 MEQ tablet Commonly known as:  K-DUR,KLOR-CON TAKE (1) TABLET BY MOUTH EVERY DAY   simvastatin 40 MG tablet Commonly known as:  ZOCOR Take 1 tablet (40 mg total) by mouth at bedtime.   TRUETRACK BLOOD GLUCOSE w/Device Kit 1 each by Does not apply route daily.            Durable Medical Equipment        Start     Ordered   01/26/17 [phone]  For home use only DME Walker rolling  Once    Question:  Patient needs a walker to treat with the following condition  Answer:  Generalized weakness   01/26/17 0842        DISCHARGE INSTRUCTIONS:   DIET:  Cardiac diet and Diabetic diet  DISCHARGE CONDITION:  Stable  ACTIVITY:  Activity as tolerated  OXYGEN:  Home Oxygen: No.   Oxygen Delivery: room air  DISCHARGE LOCATION:  Home with Home health nursing.    If you experience worsening of your admission symptoms, develop shortness of breath, life threatening emergency, suicidal or homicidal thoughts you must seek medical attention immediately by calling 911 or calling your MD immediately  if symptoms less severe.  You Must read complete instructions/literature along with all the possible adverse reactions/side effects for all the Medicines you take and that have been prescribed to you. Take any new Medicines after you have completely understood and accpet all the possible adverse reactions/side effects.   Please note  You were cared for by a hospitalist during your hospital stay. If you have any questions about your discharge medications or the care you received while you were in the hospital after you are discharged, you can call the unit and asked to speak with the hospitalist on call if the hospitalist that took care of you is not available. Once you are discharged, your primary care physician will handle any further medical issues. Please note that NO REFILLS for any  discharge medications will be authorized once you are discharged, as it is imperative that you return to your primary care physician (or establish a relationship with a primary care physician if you do not have one) for your aftercare needs so that they can reassess your need for medications and monitor your lab values.     Today   Hypoglycemia resolved and mental Status back to baseline. No other acute events overnight.  Lactate level normal.  Wants to go home.   VITAL SIGNS:  Blood pressure (!) 201/59, pulse 63, temperature 98.4 F (36.9 C), temperature source Oral, resp. rate 18, height _0  (1.702 m), weight 62.1 kg (137 lb), SpO2 100 %.  I/O:   Intake/Output Summary (Last 24 hours) at 01/26/17 1026 Last data filed at 01/26/17 0800  Gross per 24 hour  Intake          1656.25 ml  Output               10 ml  Net  1646.25 ml    PHYSICAL EXAMINATION:  GENERAL:  76 y.o.-year-old patient lying in the bed with no acute distress.  EYES: Pupils equal, round, reactive to light and accommodation. No scleral icterus. Extraocular muscles intact.  HEENT: Head atraumatic, normocephalic. Oropharynx and nasopharynx clear.  NECK:  Supple, no jugular venous distention. No thyroid enlargement, no tenderness.  LUNGS: Normal breath sounds bilaterally, no wheezing, rales,rhonchi. No use of accessory muscles of respiration.  CARDIOVASCULAR: S1, S2 normal. II/VI SEM at RSB.  No rubs, or gallops.  ABDOMEN: Soft, non-tender, non-distended. Bowel sounds present. No organomegaly or mass.  EXTREMITIES: No pedal edema, cyanosis, or clubbing.  NEUROLOGIC: Cranial nerves II through XII are intact. No focal motor or sensory defecits b/l.  PSYCHIATRIC: The patient is alert and oriented x 3.  SKIN: No obvious rash, lesion, or ulcer.   DATA REVIEW:   CBC  Recent Labs Lab 01/25/17 1234  WBC 7.1  HGB 11.1*  HCT 32.7*  PLT 228    Chemistries   Recent Labs Lab 01/25/17 1234  01/26/17 0425   NA 134*  --  139  K 3.9  --  3.8  CL 101  --  110  CO2 24  --  25  GLUCOSE 109*  --  100*  BUN 15  --  11  CREATININE 1.26*  < > 1.21*  CALCIUM 8.9  --  8.6*  AST 38  --   --   ALT 14  --   --   ALKPHOS 51  --   --   BILITOT 0.7  --   --   < > = values in this interval not displayed.  Cardiac Enzymes No results for input(s): TROPONINI in the last 168 hours.  Microbiology Results  Results for orders placed or performed during the hospital encounter of 01/25/17  Blood Culture (routine x 2)     Status: None (Preliminary result)   Collection Time: 01/25/17 12:35 PM  Result Value Ref Range Status   Specimen Description BLOOD RIGHT ANTECUBITAL  Final   Special Requests   Final    BOTTLES DRAWN AEROBIC AND ANAEROBIC Blood Culture adequate volume   Culture NO GROWTH < 24 HOURS  Final   Report Status PENDING  Incomplete  Blood Culture (routine x 2)     Status: None (Preliminary result)   Collection Time: 01/25/17 12:35 PM  Result Value Ref Range Status   Specimen Description BLOOD LEFT ANTECUBITAL  Final   Special Requests   Final    BOTTLES DRAWN AEROBIC AND ANAEROBIC Blood Culture adequate volume   Culture NO GROWTH < 24 HOURS  Final   Report Status PENDING  Incomplete    RADIOLOGY:  Dg Chest 2 View  Result Date: 01/25/2017 CLINICAL DATA:  History of congestive heart failure.  Smoker. EXAM: CHEST  2 VIEW COMPARISON:  10/27/2014 FINDINGS: The cardiac silhouette is mildly enlarged. Calcific atherosclerotic disease and tortuosity of the aorta. Mediastinal contours appear intact. There is no evidence of focal airspace consolidation, pleural effusion or pneumothorax. Chronic coarsening of the interstitial markings. Osseous structures are without acute abnormality. Soft tissues are grossly normal. IMPRESSION: Chronic coarsening of the interstitial markings. Calcific atherosclerotic disease and tortuosity of the aorta. Mild enlargement of the cardiac silhouette. Electronically Signed    By: Fidela Salisbury M.D.   On: 01/25/2017 13:30   Ct Head Wo Contrast  Result Date: 01/25/2017 CLINICAL DATA:  Hypoglycemia and altered mental status. EXAM: CT HEAD WITHOUT CONTRAST TECHNIQUE: Contiguous axial images  were obtained from the base of the skull through the vertex without intravenous contrast. COMPARISON:  10/27/2014 FINDINGS: Brain: No evidence of acute infarction, hemorrhage, hydrocephalus, extra-axial collection or mass lesion/mass effect. Possible age-indeterminate lacunar infarct versus dilated perivascular spaces in bilateral basal ganglia. Brain parenchymal volume loss and periventricular microangiopathy, moderate. Vascular: Vascular calcifications at the skullbase. Skull: Normal. Negative for fracture or focal lesion. Sinuses/Orbits: No acute finding. Other: None. IMPRESSION: Age-indeterminate lacunar infarcts versus dilated perivascular spaces in bilateral basal ganglia. Otherwise no evidence of acute intracranial abnormality. Atrophy, chronic microvascular disease. Electronically Signed   By: Fidela Salisbury M.D.   On: 01/25/2017 13:24      Management plans discussed with the patient, family and they are in agreement.  CODE STATUS:     Code Status Orders        Start     Ordered   01/25/17 1550  Full code  Continuous     01/25/17 1549    Code Status History    Date Active Date Inactive Code Status Order ID Comments User Context   This patient has a current code status but no historical code status.      TOTAL TIME TAKING CARE OF THIS PATIENT: 40 minutes.    Henreitta Leber M.D on 01/26/2017 at 10:26 AM  Between 7am to 6pm - Pager - [phone]  After 6pm go to [email or web address] - Proofreader  [facility]  Office  [phone]  CC: Primary care physician; Glean Hess, MD

## 2017-01-26 NOTE — Care Management Note (Signed)
Case Management Note  Patient Details  Name: Alyssa [name] MRN: [id number] Date of Birth: 05/28/1941  Subjective/Objective:     Discussed discharge planning with Ms Cherney. She chose [facility] for home health and a referral for [place] was called to [place] at [place].  A request for a RW was called to Melene Muller at [facility] to be delivered to Ms Frankland today in her hospital room.              Action/Plan:   Expected Discharge Date:  01/26/17               Expected Discharge Plan:   01/26/17  In-House Referral:     Discharge planning Services     Post Acute Care Choice:    Yes  Choice offered to:   Patient  DME Arranged:   RW DME Agency:   [facility]  HH Arranged:   RN [place] Agency:   [facility] [place]  Status of Service:   Completed  If discussed at [place] of Stay Meetings, dates discussed:    Additional Comments:  Ariella Voit A, RN 01/26/2017, 9:21 AM

## 2017-01-27 ENCOUNTER — Telehealth: Payer: Self-pay

## 2017-01-27 ENCOUNTER — Other Ambulatory Visit: Payer: Self-pay | Admitting: *Deleted

## 2017-01-27 DIAGNOSIS — E1169 Type 2 diabetes mellitus with other specified complication: Secondary | ICD-10-CM | POA: Diagnosis not present

## 2017-01-27 DIAGNOSIS — I5022 Chronic systolic (congestive) heart failure: Secondary | ICD-10-CM | POA: Diagnosis not present

## 2017-01-27 DIAGNOSIS — N183 Chronic kidney disease, stage 3 (moderate): Secondary | ICD-10-CM | POA: Diagnosis not present

## 2017-01-27 DIAGNOSIS — F0280 Dementia in other diseases classified elsewhere without behavioral disturbance: Secondary | ICD-10-CM | POA: Diagnosis not present

## 2017-01-27 DIAGNOSIS — I2510 Atherosclerotic heart disease of native coronary artery without angina pectoris: Secondary | ICD-10-CM | POA: Diagnosis not present

## 2017-01-27 DIAGNOSIS — E1122 Type 2 diabetes mellitus with diabetic chronic kidney disease: Secondary | ICD-10-CM | POA: Diagnosis not present

## 2017-01-27 DIAGNOSIS — G309 Alzheimer's disease, unspecified: Secondary | ICD-10-CM | POA: Diagnosis not present

## 2017-01-27 DIAGNOSIS — M1990 Unspecified osteoarthritis, unspecified site: Secondary | ICD-10-CM | POA: Diagnosis not present

## 2017-01-27 DIAGNOSIS — I130 Hypertensive heart and chronic kidney disease with heart failure and stage 1 through stage 4 chronic kidney disease, or unspecified chronic kidney disease: Secondary | ICD-10-CM | POA: Diagnosis not present

## 2017-01-27 NOTE — Telephone Encounter (Signed)
Patient nurse called from [facility] stating patient is still in a fog. Cannot spell her lastname, and cannot remember to eat. Asked should patient still be on insulin. Informed her to do 20 units daily of Levemir. She said she will call and update [place].

## 2017-01-27 NOTE — Patient Outreach (Signed)
Successful telephone encounter to daughter [name], follow up on pt's recent observation hospital stay June 30- July 1,2018 for hypoglycemia.   Spoke with daughter Levander Campion (on consent form, preferred contact for pt), HIPAA verified on pt, discussed purpose of call- follow up on pt's recent hospital stay).   Daughter reports pt is doing fine now, currently at her home.  Daughter reports reason for pt's recent hospital stay was pt was not eating, took her Metformin and insulin- sugar dropped, now she or her daughter are checking pt's sugars, making sure she eats, this am sugar was 137.    Daughter reports granddaughter has pt's hospital papers, no medication changes.   Discussed with daughter (view of pt's discharge summary) [facility] home health nursing was arranged to follow pt - blood sugars and diabetes, daughter reports no call yet.  RN CM discussed if do not hear from [facility] to call them to which daughter agreed.  Daughter also reports did not receive rolling walker yet.   Utilized teach back with daughter to hold pt's Metformin and insulin if pt not eating- daughter voiced understanding.    Plan:  As discussed with daughter, plan to follow up with pt in 3 weeks- scheduled home visit.  Also discussed with            Daughter to call RN CM earlier if needed.     Zara Chess.   [facility] Care Management  [phone]

## 2017-01-30 LAB — CULTURE, BLOOD (ROUTINE X 2)
Culture: NO GROWTH
Culture: NO GROWTH
SPECIAL REQUESTS: ADEQUATE
Special Requests: ADEQUATE

## 2017-01-31 ENCOUNTER — Other Ambulatory Visit: Payer: Self-pay | Admitting: *Deleted

## 2017-01-31 ENCOUNTER — Ambulatory Visit
Admission: RE | Admit: 2017-01-31 | Discharge: 2017-01-31 | Disposition: A | Discharge status: Home / Self Care | Payer: Medicare HMO | Source: Ambulatory Visit | Attending: Internal Medicine | Admitting: Internal Medicine

## 2017-01-31 ENCOUNTER — Encounter: Payer: Self-pay | Admitting: Internal Medicine

## 2017-01-31 ENCOUNTER — Ambulatory Visit (INDEPENDENT_AMBULATORY_CARE_PROVIDER_SITE_OTHER): Payer: Medicare HMO | Admitting: Internal Medicine

## 2017-01-31 VITALS — BP 146/88 | HR 68 | Ht 62.0 in | Wt 136.0 lb

## 2017-01-31 DIAGNOSIS — F0280 Dementia in other diseases classified elsewhere without behavioral disturbance: Secondary | ICD-10-CM

## 2017-01-31 DIAGNOSIS — I10 Essential (primary) hypertension: Secondary | ICD-10-CM | POA: Diagnosis not present

## 2017-01-31 DIAGNOSIS — Z794 Long term (current) use of insulin: Secondary | ICD-10-CM | POA: Diagnosis not present

## 2017-01-31 DIAGNOSIS — M25552 Pain in left hip: Secondary | ICD-10-CM

## 2017-01-31 DIAGNOSIS — R935 Abnormal findings on diagnostic imaging of other abdominal regions, including retroperitoneum: Secondary | ICD-10-CM | POA: Diagnosis not present

## 2017-01-31 DIAGNOSIS — E1122 Type 2 diabetes mellitus with diabetic chronic kidney disease: Secondary | ICD-10-CM | POA: Diagnosis not present

## 2017-01-31 DIAGNOSIS — E162 Hypoglycemia, unspecified: Secondary | ICD-10-CM

## 2017-01-31 DIAGNOSIS — N183 Chronic kidney disease, stage 3 (moderate): Secondary | ICD-10-CM

## 2017-01-31 DIAGNOSIS — M160 Bilateral primary osteoarthritis of hip: Secondary | ICD-10-CM | POA: Diagnosis not present

## 2017-01-31 DIAGNOSIS — G309 Alzheimer's disease, unspecified: Secondary | ICD-10-CM | POA: Diagnosis not present

## 2017-01-31 MED ORDER — GLUCOSE BLOOD VI STRP: ORAL_STRIP | 12 refills | Status: DC

## 2017-01-31 MED ORDER — ASPIRIN EC 81 MG PO TBEC: 81.0000 mg | DELAYED_RELEASE_TABLET | Freq: Every day | ORAL | 12 refills | Status: DC

## 2017-01-31 MED ORDER — POTASSIUM CHLORIDE 20 MEQ PO PACK: 20.0000 meq | PACK | Freq: Every day | ORAL | 5 refills | Status: DC

## 2017-01-31 MED ORDER — LISINOPRIL 40 MG PO TABS: 40.0000 mg | ORAL_TABLET | Freq: Every day | ORAL | 5 refills | Status: DC

## 2017-01-31 NOTE — Progress Notes (Signed)
Date:  01/31/2017   Name:  Alyssa [name]   DOB:  May 22, 1941   MRN:  [id number]   Chief Complaint: Hospitalization Follow-up (Feeling much better since hospital- ) and Hip Pain (Left hip pain- 2 days ago. Comes and goes. Gets worse when walking- can't describe pain.  ) Admitted to [facility] 01/25/17 to 01/26/17 for hypoglycemia.  She had been taking her medication including insulin and not eating.  Blood sugar dropped and she had mental status changes.  Head CT was negative.  CXR and UA were negative.  She had some hypothermia felt secondary to hypoglycemia.  She was discharged home with the same medication regimen.  She was set up with [facility].  Hip Pain   There was no injury mechanism. The pain is present in the left hip. The pain is mild. Associated symptoms include an inability to bear weight. Pertinent negatives include no muscle weakness, numbness or tingling. The symptoms are aggravated by weight bearing. She has tried acetaminophen for the symptoms. The treatment provided mild relief.  Diabetes  She presents for her follow-up diabetic visit. She has type 2 diabetes mellitus. Her disease course has been improving. Pertinent negatives for diabetes include no chest pain and no fatigue. She is compliant with treatment most of the time (family is now monitoring intake, making sure she eats before she takes her medicine). Her breakfast blood glucose is taken between 6-7 am. Her breakfast blood glucose range is generally 130-140 mg/dl. An ACE inhibitor/angiotensin II receptor blocker is being taken.      Review of Systems  Constitutional: Negative for chills, fatigue and unexpected weight change.  Eyes: Negative for visual disturbance.  Respiratory: Negative for chest tightness, shortness of breath and wheezing.   Cardiovascular: Negative for chest pain.  Gastrointestinal: Negative for abdominal pain.  Genitourinary: Negative for dysuria.  Musculoskeletal: Positive for arthralgias,  gait problem and myalgias.  Skin: Negative for rash.  Neurological: Negative for tingling and numbness.  Psychiatric/Behavioral: Negative for sleep disturbance.    Patient Active Problem List   Diagnosis Date Noted  . Hypoglycemia 01/25/2017  . Cardiomyopathy due to hypertension, without heart failure ([place]) 09/23/2016  . Cardiac murmur 06/03/2016  . Acute GI bleeding 03/05/2016  . 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 Hyperlipidemia associated with type 2 diabetes mellitus ([place])   . Alzheimer's dementia without behavioral disturbance 03/07/2015  . Coronary artery disease involving native coronary artery of native heart without angina pectoris 12/02/2014  . Allergic rhinitis 11/15/2014  . Essential hypertension 11/15/2014  . Arthritis, degenerative 11/15/2014  . Hypokalemia 11/15/2014  . H/O gastrointestinal hemorrhage 03/28/2011    Prior to Admission medications   Medication Sig Start Date End Date Taking? Authorizing Provider  acetaminophen (TYLENOL) 500 MG tablet Take 500 mg by mouth every 6 (six) hours as needed for mild pain.    Yes [provider]  amLODipine (NORVASC) 10 MG tablet TAKE (1) TABLET BY MOUTH EVERY DAY 12/16/16  Yes Glean Hess, MD  aspirin EC 81 MG tablet Take 81 mg by mouth daily.   Yes [provider]  Blood Glucose Monitoring Suppl (TRUETRACK BLOOD GLUCOSE) w/Device KIT 1 each by Does not apply route daily. 12/06/15  Yes Glean Hess, MD  donepezil (ARICEPT) 5 MG tablet TAKE ONE TABLET AT BEDTIME. 09/17/16  Yes Glean Hess, MD  insulin detemir (LEVEMIR) 100 UNIT/ML injection Inject 20 Units  into the skin daily.    Yes [provider]  Insulin Syringe-Needle U-100 (GLOBAL INJECT EASE INSULIN SYR) 30G X 1/2" 0.3 ML MISC USE ONCE A DAY FOR INSULIN 12/16/16  Yes Glean Hess, MD  LEVEMIR 100 UNIT/ML injection INJECT 25 UNITS UNDER SKIN  NIGHTLY. 01/27/17  Yes Glean Hess, MD  lisinopril (PRINIVIL,ZESTRIL) 40 MG tablet TAKE (1) TABLET BY MOUTH EVERY DAY 01/27/17  Yes Glean Hess, MD  metFORMIN (GLUCOPHAGE) 500 MG tablet Take 1 tablet (500 mg total) by mouth 2 (two) times daily with a meal. 12/16/16  Yes Glean Hess, MD  metoprolol succinate (TOPROL-XL) 50 MG 24 hr tablet TAKE ONE (1) TABLET BY MOUTH ONCE DAILY 12/16/16  Yes Glean Hess, MD  omeprazole (PRILOSEC) 40 MG capsule TAKE ONE CAPSULE TWICE A DAY 01/27/17  Yes Glean Hess, MD  potassium chloride SA (K-DUR,KLOR-CON) 20 MEQ tablet TAKE (1) TABLET BY MOUTH EVERY DAY 06/11/16  Yes Glean Hess, MD  simvastatin (ZOCOR) 40 MG tablet Take 1 tablet (40 mg total) by mouth at bedtime. 12/16/16  Yes Glean Hess, MD    No Known Allergies  Past Surgical History:  Procedure Laterality Date  . ABDOMINAL HYSTERECTOMY    . CHOLECYSTECTOMY    . TONSILLECTOMY      Social History  Substance Use Topics  . Smoking status: Current Every Day Smoker    Packs/day: 0.25    Years: 10.00    Types: Cigarettes    Last attempt to quit: 09/11/1982  . Smokeless tobacco: Current User    Types: Chew  . Alcohol use 0.6 oz/week    1 Cans of beer per week     Medication list has been reviewed and updated.   Physical Exam  Constitutional: She is oriented to person, place, and time. She appears well-developed. No distress.  HENT:  Head: Normocephalic and atraumatic.  Neck: Normal range of motion. Neck supple. No thyromegaly present.  Cardiovascular: Normal rate, regular rhythm and normal heart sounds.   Pulmonary/Chest: Effort normal and breath sounds normal. No respiratory distress. She has no wheezes.  Musculoskeletal: She exhibits no edema.       Right hip: Normal.       Left hip: She exhibits bony tenderness. She exhibits normal range of motion.  Neurological: She is alert and oriented to person, place, and time.  Skin: Skin is warm and dry. No rash  noted.  Psychiatric: She has a normal mood and affect. Her speech is normal and behavior is normal. Thought content normal.  Nursing note and vitals reviewed.   BP (!) 146/88   Pulse 68   Ht 5' 2" (1.575 m)   Wt 136 lb (61.7 kg)   SpO2 100%   BMI 24.87 kg/m   Assessment and Plan: 1. Hypoglycemia Improved with no further issues Reinforced the need to eat when taking insulin - glucose blood (TRUETRACK TEST) test strip; Use to test blood sugar up to 4 times per day  Dispense: 100 each; Refill: 12  2. Pain of left hip joint Walker ordered by [facility] Will get Xray Recommend tylenol 1000 mg every 6-8 hours as needed - DG HIP UNILAT WITH PELVIS 2-3 VIEWS LEFT; Future  3. Type 2 diabetes mellitus with stage 3 chronic kidney disease, with long-term current use of insulin (HCC) - aspirin EC 81 MG tablet; Take 1 tablet (81 mg total) by mouth daily.  Dispense: 30 tablet; Refill: 12  4. Alzheimer's dementia without  behavioral disturbance, unspecified timing of dementia onset stable  5. Essential hypertension Fair control - potassium chloride (KLOR-CON) 20 MEQ packet; Take 20 mEq by mouth daily.  Dispense: 30 packet; Refill: 5 - lisinopril (PRINIVIL,ZESTRIL) 40 MG tablet; Take 1 tablet (40 mg total) by mouth daily.  Dispense: 30 tablet; Refill: 5   Meds ordered this encounter  Medications  . glucose blood (TRUETRACK TEST) test strip    Sig: Use to test blood sugar up to 4 times per day    Dispense:  100 each    Refill:  12  . potassium chloride (KLOR-CON) 20 MEQ packet    Sig: Take 20 mEq by mouth daily.    Dispense:  30 packet    Refill:  5  . lisinopril (PRINIVIL,ZESTRIL) 40 MG tablet    Sig: Take 1 tablet (40 mg total) by mouth daily.    Dispense:  30 tablet    Refill:  5  . aspirin EC 81 MG tablet    Sig: Take 1 tablet (81 mg total) by mouth daily.    Dispense:  30 tablet    Refill:  [place], MD [facility]  01/31/2017

## 2017-01-31 NOTE — Patient Instructions (Signed)
Take 2 tylenol as needed (up to 4 times per day) for hip pain

## 2017-01-31 NOTE — Patient Outreach (Signed)
[facility]) Care Management  01/31/2017  Alyssa [name] 06/24/1941 [id number]   Phone call to patient to follow up on referral to the Meals program at the [facility]. Per patient, she has continued interest in this program, however has not been notified of a start date.    Plan: This Education officer, museum will contact the coordinator of this program to follow up on the referral.    [place], [place] Management [phone]

## 2017-02-07 ENCOUNTER — Ambulatory Visit: Payer: Medicare HMO | Admitting: *Deleted

## 2017-02-07 ENCOUNTER — Ambulatory Visit: Payer: Self-pay | Admitting: *Deleted

## 2017-02-10 ENCOUNTER — Ambulatory Visit: Payer: Self-pay | Admitting: *Deleted

## 2017-02-11 ENCOUNTER — Other Ambulatory Visit: Payer: Self-pay | Admitting: *Deleted

## 2017-02-11 ENCOUNTER — Encounter (INDEPENDENT_AMBULATORY_CARE_PROVIDER_SITE_OTHER): Payer: Medicare HMO | Admitting: Internal Medicine

## 2017-02-11 DIAGNOSIS — I43 Cardiomyopathy in diseases classified elsewhere: Secondary | ICD-10-CM

## 2017-02-11 DIAGNOSIS — E1122 Type 2 diabetes mellitus with diabetic chronic kidney disease: Secondary | ICD-10-CM | POA: Diagnosis not present

## 2017-02-11 DIAGNOSIS — I119 Hypertensive heart disease without heart failure: Secondary | ICD-10-CM

## 2017-02-11 DIAGNOSIS — N183 Chronic kidney disease, stage 3 (moderate): Secondary | ICD-10-CM | POA: Diagnosis not present

## 2017-02-11 DIAGNOSIS — I2510 Atherosclerotic heart disease of native coronary artery without angina pectoris: Secondary | ICD-10-CM

## 2017-02-11 DIAGNOSIS — Z794 Long term (current) use of insulin: Secondary | ICD-10-CM

## 2017-02-11 DIAGNOSIS — I10 Essential (primary) hypertension: Secondary | ICD-10-CM

## 2017-02-11 DIAGNOSIS — F0280 Dementia in other diseases classified elsewhere without behavioral disturbance: Secondary | ICD-10-CM

## 2017-02-11 DIAGNOSIS — E785 Hyperlipidemia, unspecified: Secondary | ICD-10-CM

## 2017-02-11 DIAGNOSIS — E1169 Type 2 diabetes mellitus with other specified complication: Secondary | ICD-10-CM

## 2017-02-11 DIAGNOSIS — G309 Alzheimer's disease, unspecified: Secondary | ICD-10-CM

## 2017-02-11 DIAGNOSIS — Z9181 History of falling: Secondary | ICD-10-CM | POA: Insufficient documentation

## 2017-02-11 DIAGNOSIS — Z8719 Personal history of other diseases of the digestive system: Secondary | ICD-10-CM

## 2017-02-11 NOTE — Progress Notes (Signed)
Received home health orders from [facility]. Orders for initial certification from 02/28/74 to 03/27/17 are reviewed, signed and mailed.

## 2017-02-11 NOTE — Patient Outreach (Signed)
[facility]) Care Management  02/11/2017  Alyssa [name] Sep 21, 1940 [id number]   Phone call to patient to follow up on the referral for the congregate meal program. Per patient, she has not received a phone call from the congregate meal program however is still very interested. This Education officer, museum contacted the coordinator of this program to request a return call regarding a status update.    Plan: This Education officer, museum will follow up with patient within 2 weeks.    [name] [facility] Care Management [phone]

## 2017-02-12 ENCOUNTER — Other Ambulatory Visit: Payer: Self-pay | Admitting: *Deleted

## 2017-02-12 NOTE — Patient Outreach (Signed)
[facility]) Care Management  02/12/2017  Alyssa [name] 06/17/1941 [id number]   Secure e-mail received  from [employer] congregate meals coordinator in [place] to request the days of the week patient would like to attend the program. Patient contacted and states that she would like to attend twice per week, on Tuesdays and Thursdays. Alyssa [name] will contact this social worker back with start date.   [name] [facility] Care Management [phone]

## 2017-02-12 NOTE — Patient Outreach (Signed)
[facility]) Care Management  02/12/2017  Alyssa [name] 06-07-1941 [id number]   Phone call to patient to confirm contact with the congregate meal coordinator and to confirm days she would like to attend. Per patient, she would prefer to start out with two days a week, Tuesdays and Thursdays.  Patient's preferred days to be given to [employer], congregate meal coordinator.   Alyssa [name] [facility] Care Management [phone]

## 2017-02-20 ENCOUNTER — Other Ambulatory Visit: Payer: Self-pay | Admitting: *Deleted

## 2017-02-20 NOTE — Patient Outreach (Addendum)
[place] [facility]) Care Management   02/20/2017  HARLEIGH CIVELLO [birth year] [id number]  GEARLDEAN LOMANTO is an 76 y.o. female  Subjective:  Pt's daughter reports back to work next month, concerned about pt taking diabetic Medication/insulin when not eating as had several low sugar readings.  Daughter reports pt she  Checks pt's sugars three times a day, making sure she eats before giving her DM medication/ Insulin, does not want her to end up in the hospital again.   Daughter reports pt gets stubborn At times, does not want to eat.  Daughter reports pt still has not received a walker yet, was  Suppose to be ordered by HHPT, when up walking left hip hurts making it difficult to walk.    Daughter reports has not heard from [facility] yet for pt, been 2 weeks without a phone, Plan to get back on next week.  Daughter requested calling pt's granddaughter if needed. Daughter reports not been checking pt's BP, had sickness in the family.   Objective:   Vitals:   02/20/17 1023 02/20/17 1112  BP: (!) 170/90 (!) 170/70  Pulse: 63   Resp: 20     ROS  Physical Exam  Constitutional: She appears well-developed and well-nourished.  Cardiovascular: Normal rate and regular rhythm.   Musculoskeletal: Normal range of motion. She exhibits no edema.  Neurological: She is alert.  Able to state name/dob.  Unable to state address without assistance.   Skin: Skin is warm and dry.  Psychiatric: She has a normal mood and affect. Her behavior is normal.    Encounter Medications:   Outpatient Encounter Prescriptions as of 02/20/2017  Medication Sig Note  . acetaminophen (TYLENOL) 500 MG tablet Take 500 mg by mouth every 6 (six) hours as needed for mild pain.  01/20/2017: As needed.   Marland Kitchen amLODipine (NORVASC) 10 MG tablet TAKE (1) TABLET BY MOUTH EVERY DAY   . aspirin EC 81 MG tablet Take 1 tablet (81 mg total) by mouth daily.   . Blood Glucose Monitoring Suppl (TRUETRACK BLOOD GLUCOSE) w/Device  KIT 1 each by Does not apply route daily.   Marland Kitchen donepezil (ARICEPT) 5 MG tablet TAKE ONE TABLET AT BEDTIME.   Marland Kitchen glucose blood (TRUETRACK TEST) test strip Use to test blood sugar up to 4 times per day   . insulin detemir (LEVEMIR) 100 UNIT/ML injection Inject 20 Units into the skin daily.    . Insulin Syringe-Needle U-100 (GLOBAL INJECT EASE INSULIN SYR) 30G X 1/2" 0.3 ML MISC USE ONCE A DAY FOR INSULIN   . LEVEMIR 100 UNIT/ML injection INJECT 25 UNITS UNDER SKIN NIGHTLY.   Marland Kitchen lisinopril (PRINIVIL,ZESTRIL) 40 MG tablet Take 1 tablet (40 mg total) by mouth daily.   . metFORMIN (GLUCOPHAGE) 500 MG tablet Take 1 tablet (500 mg total) by mouth 2 (two) times daily with a meal.   . metoprolol succinate (TOPROL-XL) 50 MG 24 hr tablet TAKE ONE (1) TABLET BY MOUTH ONCE DAILY   . omeprazole (PRILOSEC) 40 MG capsule TAKE ONE CAPSULE TWICE A DAY   . potassium chloride (KLOR-CON) 20 MEQ packet Take 20 mEq by mouth daily.   . potassium chloride SA (K-DUR,KLOR-CON) 20 MEQ tablet TAKE (1) TABLET BY MOUTH EVERY DAY   . simvastatin (ZOCOR) 40 MG tablet Take 1 tablet (40 mg total) by mouth at bedtime.    No facility-administered encounter medications on file as of 02/20/2017.     Functional Status:   In your present state of  health, do you have any difficulty performing the following activities: 01/25/2017 01/02/2017  Hearing? N N  Vision? N Y  Difficulty concentrating or making decisions? [name]  Walking or climbing stairs? Y Y  Dressing or bathing? N N  Doing errands, shopping? Y Y  Conservation officer, nature and eating ? - N  Using the Toilet? - N  In the past six months, have you accidently leaked urine? - Y  Do you have problems with loss of bowel control? - Y  Managing your Medications? - Y  Managing your Finances? - Y  Housekeeping or managing your Housekeeping? - Y  Some recent data might be hidden    Fall/Depression Screening:    Fall Risk  01/02/2017 12/12/2016 04/11/2016  Falls in the past year? No No No   PHQ  2/9 Scores 01/02/2017 12/12/2016 12/11/2016 04/11/2016 12/21/2015 12/06/2015 03/07/2015  PHQ - 2 Score 0 - 0 0 2 1 0  PHQ- 9 Score - - - - 8 - -  Exception Documentation - Other- indicate reason in comment box - - - - -  Not completed - call completed w/ dtr - - - - -    Assessment:  Pleasant 76 year old female, lives alone, daughter close by- present during home visit  checks on pt's daily Lungs clear, no complaints of sob/chest pain,no edema.     Hypertension:  BP today 170/90, recheck 170/70 (took am medications approximately 2 hours earlier).   DM:  View of pt's glucometer- had a reading  that read low on glucometer, per daughter gave pt peanut            Butter sandwich/1/2 soda, recheck sugar came up.  Most recent sugars 02/18/17- 77, recheck 94, yesterday           120.   Did not check today- need more strips.  Reinforced with pt/daughter importance of pt having 3             Meals a day/snack at night.    DME/need for walker:  RN CM left a voice message with PCP's nurse- relaying pt needs a walker, was suppose           To be ordered by HHPT, did not receive, need to be ordered.    Plan:  As discussed with daughter, to call PCP today, strips called into [facility] (using Reli on               Glucometer), wrong strips given at another pharmacy, start back checking sugars.            As discussed with daughter, to start checking pt's BP.             As discussed with pt/daughter, to continue to follow with community CM services, follow up              Again next month- home visit.      [facility] CM Care Plan Problem One     Most Recent Value  Care Plan Problem One  Hypertension- self management(caregive added).   Role Documenting the Problem One  Care Management [place] for Problem One  Active  THN Long Term Goal   Pt would see a decrease in BP within the next 60 days   THN Long Term Goal Start Date  01/20/17  Interventions for Problem One Long Term Goal  Reviewed with pt/daughter  risks for ongoing high BP   [facility] CM Short Term  Goal #1   Daughter to check pt's BP 2-3 times a week, record in the next 30 days   [facility] CM Short Term Goal #1 Start Date  01/20/17  [facility] CM Short Term Goal #2   Pt would see improvement in adherence with low Na+ diet in the next 30 days   [facility] CM Short Term Goal #2 Start Date  01/20/17  [facility] CM Short Term Goal #2 Met Date  02/20/17 [not addressed in time fram ]    [facility] CM Care Plan Problem Two     Most Recent Value  Care Plan Problem Two  Diabetes- hypoglycemia episode (hospital observation)  Role Documenting the Problem Two  Care Management Coordinator  Care Plan for Problem Two  Active  [facility] CM Short Term Goal #1   Pt would have no hypoglycemic episodes within the next 24 days   [facility] CM Short Term Goal #1 Start Date  01/27/17      Zara Chess.   [place] Management  [phone]  Note:  04/04/17- reviewed, no addendum made.    Zara Chess.   [place] Care Management  [phone]

## 2017-02-25 ENCOUNTER — Other Ambulatory Visit: Payer: Self-pay | Admitting: *Deleted

## 2017-02-25 ENCOUNTER — Encounter: Payer: Self-pay | Admitting: *Deleted

## 2017-02-25 NOTE — Patient Outreach (Signed)
[facility]) Care Management  02/25/2017  Alyssa [name] 1941/05/15 [id number]   Phone call to patient to follow up on the referral to the congregational meal program. Patient's phone disconnected. This Education officer, museum will follow up with patient's daughter on [facility] consent.    [name] [facility] Care Management [phone]

## 2017-02-25 NOTE — Telephone Encounter (Signed)
This encounter was created in error - please disregard.

## 2017-02-25 NOTE — Patient Outreach (Signed)
[place] [facility]) Care Management  02/25/2017  MELAYSIA STREED 09/13/1940 [id number]   Phone call to patient's daughter to follow up on referral to the congregational meal program and to explore if there are any additional phone numbers for patient as patient's number was disconnected. Phone rang, however no message able to left as the voicemail had not been set up yet.    [name] [facility] Care Management [phone]

## 2017-03-10 ENCOUNTER — Ambulatory Visit: Payer: Self-pay | Admitting: *Deleted

## 2017-03-11 ENCOUNTER — Other Ambulatory Visit: Payer: Self-pay | Admitting: *Deleted

## 2017-03-11 ENCOUNTER — Ambulatory Visit: Payer: Self-pay | Admitting: *Deleted

## 2017-03-11 NOTE — Patient Outreach (Signed)
[facility]) Care Management  03/11/2017  STEFFANI DIONISIO [birth year] [id number]   Phone call to patient to follow up on the congregational meal program referral at the senior center. There was no answer. This social worker will try to contact patient again tomorrow.   [name] [facility] Care Management [phone]

## 2017-03-12 ENCOUNTER — Other Ambulatory Visit: Payer: Self-pay | Admitting: *Deleted

## 2017-03-12 ENCOUNTER — Ambulatory Visit: Payer: Self-pay | Admitting: *Deleted

## 2017-03-12 NOTE — Patient Outreach (Signed)
[place] [facility]) Care Management  03/12/2017  Alyssa [name] 11-Jan-1941 [id number]   Phone call to patient to follow up on referral to the congregational meal program at the [facility]. The phone rang several times, however there was no answer. This Education officer, museum will attempt another call within 2 weeks.   [name] [facility] Care Management [phone]

## 2017-03-24 ENCOUNTER — Other Ambulatory Visit: Payer: Self-pay | Admitting: *Deleted

## 2017-03-24 ENCOUNTER — Other Ambulatory Visit: Payer: Self-pay | Admitting: Internal Medicine

## 2017-03-24 DIAGNOSIS — E118 Type 2 diabetes mellitus with unspecified complications: Secondary | ICD-10-CM

## 2017-03-24 NOTE — Patient Outreach (Signed)
Arrived at pt's home for scheduled visit, knocked on door/rang doorbell several times - no response.  10:34 am - called pt's home  phone  Several times-  Phone just  kept ringing.    10:44 am- Unsuccessful telephone call to daughter [name], unable to leave a voice message as not set up.     Plan:  This RN CM to follow up with daughter Levander Campion (on consent form) within the next 4 days.    Zara Chess.   [place] Care Management  [phone]

## 2017-03-26 ENCOUNTER — Other Ambulatory Visit: Payer: Self-pay | Admitting: *Deleted

## 2017-03-26 ENCOUNTER — Ambulatory Visit: Payer: Self-pay | Admitting: *Deleted

## 2017-03-26 NOTE — Patient Outreach (Signed)
[place] [facility]) Care Management  03/26/2017  Alyssa [name] Aug 13, 1940 [id number]    Phone call to patient to follow up on referral to the congregational meal program at the [facility]. The phone rang several times, however there was no answer. This Education officer, museum will attempt another call within 2 weeks.   [name] [facility] Care Management [phone]

## 2017-03-26 NOTE — Patient Outreach (Signed)
Another attempt made to contact pt, follow up on pt's  no show for scheduled home visit 03/24/17.   Called pt 's daughter [name] (main contact,pt has Dementia) unable to leave a voice message as not set up.  Also called pt's home number- phone kept ringing.    Plan:  RN to make another attempt next week telephonically.   Zara Chess.   [place] Care Management  [phone]

## 2017-03-28 ENCOUNTER — Ambulatory Visit: Payer: Self-pay | Admitting: *Deleted

## 2017-04-03 ENCOUNTER — Other Ambulatory Visit: Payer: Self-pay | Admitting: *Deleted

## 2017-04-03 NOTE — Patient Outreach (Signed)
[facility]) Care Management  04/03/2017  Alyssa [name] 1941/02/25 [id number]   This [facility] CSW attempted to contact patient today by phone for follow up in regards to congregational meal program. There was no answer and no voicemail.  CSW will attempt another phone follow up attempt next week.   Eduard Clos, MSW, [place] Worker  [place] [phone]

## 2017-04-04 ENCOUNTER — Encounter: Payer: Self-pay | Admitting: *Deleted

## 2017-04-04 ENCOUNTER — Other Ambulatory Visit: Payer: Self-pay | Admitting: *Deleted

## 2017-04-04 NOTE — Patient Outreach (Signed)
9:21 am- Unsuccessful telephone encounter for Alyssa [name], 76 year old female(hx of Dementia) to granddaughter Ginny Forth (on [facility] consent form) as daughter Lilly Cove (main contact) said to call if cannot contact her.  HIPAA compliant voice message left with contact name and number.    9:23 am- Third Unsuccessful telephone encounter for Alyssa [name], 76 year old female to daughter Lilly Cove (on consent form, main contact).  Unable to leave a voice message- message on phone not set up.    Plan:  If no response to voice message left with pt's granddaughter, plan to send an unable to contact letter/no response to letter in 10 business days, plan to discharge from [place] services.     Zara Chess.   [place] Care Management  [phone]

## 2017-04-05 ENCOUNTER — Emergency Department
Admission: EM | Admit: 2017-04-05 | Discharge: 2017-04-05 | Disposition: A | Discharge status: Home / Self Care | Payer: Medicare HMO | Attending: Emergency Medicine | Admitting: Emergency Medicine

## 2017-04-05 ENCOUNTER — Encounter: Payer: Self-pay | Admitting: Emergency Medicine

## 2017-04-05 ENCOUNTER — Emergency Department: Payer: Medicare HMO

## 2017-04-05 DIAGNOSIS — Y929 Unspecified place or not applicable: Secondary | ICD-10-CM | POA: Insufficient documentation

## 2017-04-05 DIAGNOSIS — I130 Hypertensive heart and chronic kidney disease with heart failure and stage 1 through stage 4 chronic kidney disease, or unspecified chronic kidney disease: Secondary | ICD-10-CM | POA: Insufficient documentation

## 2017-04-05 DIAGNOSIS — Z79899 Other long term (current) drug therapy: Secondary | ICD-10-CM | POA: Insufficient documentation

## 2017-04-05 DIAGNOSIS — I509 Heart failure, unspecified: Secondary | ICD-10-CM | POA: Insufficient documentation

## 2017-04-05 DIAGNOSIS — Y999 Unspecified external cause status: Secondary | ICD-10-CM | POA: Diagnosis not present

## 2017-04-05 DIAGNOSIS — S42421A Displaced comminuted supracondylar fracture without intercondylar fracture of right humerus, initial encounter for closed fracture: Secondary | ICD-10-CM | POA: Diagnosis not present

## 2017-04-05 DIAGNOSIS — G309 Alzheimer's disease, unspecified: Secondary | ICD-10-CM | POA: Insufficient documentation

## 2017-04-05 DIAGNOSIS — Y939 Activity, unspecified: Secondary | ICD-10-CM | POA: Diagnosis not present

## 2017-04-05 DIAGNOSIS — I420 Dilated cardiomyopathy: Secondary | ICD-10-CM | POA: Diagnosis not present

## 2017-04-05 DIAGNOSIS — F17210 Nicotine dependence, cigarettes, uncomplicated: Secondary | ICD-10-CM | POA: Insufficient documentation

## 2017-04-05 DIAGNOSIS — S4991XA Unspecified injury of right shoulder and upper arm, initial encounter: Secondary | ICD-10-CM | POA: Diagnosis present

## 2017-04-05 DIAGNOSIS — E1122 Type 2 diabetes mellitus with diabetic chronic kidney disease: Secondary | ICD-10-CM | POA: Insufficient documentation

## 2017-04-05 DIAGNOSIS — W19XXXA Unspecified fall, initial encounter: Secondary | ICD-10-CM | POA: Diagnosis not present

## 2017-04-05 DIAGNOSIS — W231XXA Caught, crushed, jammed, or pinched between stationary objects, initial encounter: Secondary | ICD-10-CM | POA: Diagnosis not present

## 2017-04-05 DIAGNOSIS — F0280 Dementia in other diseases classified elsewhere without behavioral disturbance: Secondary | ICD-10-CM | POA: Diagnosis not present

## 2017-04-05 DIAGNOSIS — S42411A Displaced simple supracondylar fracture without intercondylar fracture of right humerus, initial encounter for closed fracture: Secondary | ICD-10-CM | POA: Insufficient documentation

## 2017-04-05 DIAGNOSIS — N183 Chronic kidney disease, stage 3 (moderate): Secondary | ICD-10-CM | POA: Insufficient documentation

## 2017-04-05 DIAGNOSIS — Z794 Long term (current) use of insulin: Secondary | ICD-10-CM | POA: Insufficient documentation

## 2017-04-05 DIAGNOSIS — Z7982 Long term (current) use of aspirin: Secondary | ICD-10-CM | POA: Insufficient documentation

## 2017-04-05 DIAGNOSIS — M79601 Pain in right arm: Secondary | ICD-10-CM | POA: Diagnosis not present

## 2017-04-05 DIAGNOSIS — I2510 Atherosclerotic heart disease of native coronary artery without angina pectoris: Secondary | ICD-10-CM | POA: Diagnosis not present

## 2017-04-05 MED ORDER — FENTANYL CITRATE (PF) 100 MCG/2ML IJ SOLN
50.0000 ug | Freq: Once | INTRAMUSCULAR | Status: AC
  Administered 2017-04-05: 50 ug via INTRAMUSCULAR

## 2017-04-05 MED ORDER — OXYCODONE HCL 5 MG PO TABS
5.0000 mg | ORAL_TABLET | Freq: Once | ORAL | Status: AC
  Administered 2017-04-05: 5 mg via ORAL
  Filled 2017-04-05: qty 1

## 2017-04-05 MED ORDER — OXYCODONE HCL 5 MG PO TABS: 5.0000 mg | ORAL_TABLET | Freq: Four times a day (QID) | ORAL | 0 refills | Status: DC | PRN

## 2017-04-05 MED ORDER — FENTANYL CITRATE (PF) 100 MCG/2ML IJ SOLN
INTRAMUSCULAR | Status: AC
  Administered 2017-04-05: 50 ug via INTRAMUSCULAR
  Filled 2017-04-05: qty 2

## 2017-04-05 MED ORDER — ACETAMINOPHEN 500 MG PO TABS
1000.0000 mg | ORAL_TABLET | Freq: Once | ORAL | Status: AC
  Administered 2017-04-05: 1000 mg via ORAL
  Filled 2017-04-05: qty 2

## 2017-04-05 MED ORDER — ACETAMINOPHEN 500 MG PO TABS: 1000.0000 mg | ORAL_TABLET | Freq: Three times a day (TID) | ORAL | 0 refills | Status: AC

## 2017-04-05 NOTE — Discharge Instructions (Signed)
Follow up with Dr. Mack Guise on Monday at his office for surgical consultation. Keep splint dry and in place. USe sling at all times.   Pain control: Take tylenol 1000mg  every 8 hours. Take 5mg  of oxycodone every 6 hours for breakthrough pain. If you need the oxycodone make sure to take one senokot as well to prevent constipation.  Do not drink alcohol, drive or participate in any other potentially dangerous activities while taking this medication as it may make you sleepy. Do not take this medication with any other sedating medications, either prescription or over-the-counter.   Return to the ER if the pain: May be far more severe than it should be for the injury you have. May get worse: When moving or stretching the affected body part. When the area is pushed or squeezed. When raising (elevating) affected body part above the level of the heart. May come with a feeling of tingling or burning. May not get better when you take pain medicine.  Or if: A feeling of tightness or fullness in the affected area. A loss of feeling. Weakness in the area. Loss of movement. Skin becoming pale, tight, and shiny over the painful area. Warmth and tenderness. Tensing when the affected area is touched.

## 2017-04-05 NOTE — ED Provider Notes (Signed)
[facility] Emergency Department Provider Note  ____________________________________________  Time seen: Approximately 8:47 PM  I have reviewed the triage vital signs and the nursing notes.   HISTORY  Chief Complaint Fall and Arm Pain   HPI Alyssa [name] is a 76 y.o. female who presents for evaluation of right elbow pain status post mechanical fall. Patient reports that her foot got caught in a rock and she fell forward. She tried to break her fall with her right arm. She is complaining of severe pain located in her right elbow that has been constant since the fall, nonradiating, and worse with movement of the arm. She denies weakness or paresthesias distally on the right upper extremity. She is right-handed. She denies any other injuries especially denies head trauma, LOC, neck pain, back pain, chest pain, or any other extremity pain. Patient does not take any blood thinners.  Past Medical History:  Diagnosis Date  . Chronic systolic CHF (congestive heart failure) ([place])   . CKD (chronic kidney disease), stage III   . Essential hypertension   . Hyperlipidemia   . Hypertensive Non-ischemic Cardiomyopathy    a. 2010 Echo: EF 55%;  b. 2010 borderline MV w/ apical thinning; c. 2010 Cath: LCX 50p->Med Rx;  d. 09/2014 Echo: EF 35-40% w/ Sev LVH and glob HK;  e. 09/2014 MV: EF 30-44%, small fixed apical defect (breast attenuation), no ischemia.  . Hypokalemia   . Type II diabetes mellitus [facility])     Patient Active Problem List   Diagnosis Date Noted  . History of falling 02/11/2017  . Hip pain, left 01/31/2017  . Hypoglycemia 01/25/2017  . Cardiomyopathy due to hypertension, without heart failure ([place]) 09/23/2016  . Cardiac murmur 06/03/2016  . Acute GI bleeding 03/05/2016  . Type 2 diabetes mellitus with stage 3 chronic kidney disease, with long-term current use of insulin ([place]) 12/06/2015  . Hyperlipidemia associated with type 2 diabetes mellitus ([place])   .  Alzheimer's dementia without behavioral disturbance 03/07/2015  . Coronary artery disease involving native coronary artery of native heart without angina pectoris 12/02/2014  . Allergic rhinitis 11/15/2014  . Essential hypertension 11/15/2014  . Arthritis, degenerative 11/15/2014  . Hypokalemia 11/15/2014  . H/O gastrointestinal hemorrhage 03/28/2011    Past Surgical History:  Procedure Laterality Date  . ABDOMINAL HYSTERECTOMY    . CHOLECYSTECTOMY    . TONSILLECTOMY      Prior to Admission medications   Medication Sig Start Date End Date Taking? Authorizing Provider  acetaminophen (TYLENOL) 500 MG tablet Take 2 tablets (1,000 mg total) by mouth every 8 (eight) hours. 04/05/17 04/10/17  Rudene Re, MD  amLODipine (NORVASC) 10 MG tablet TAKE (1) TABLET BY MOUTH EVERY DAY 12/16/16   Glean Hess, MD  aspirin EC 81 MG tablet Take 1 tablet (81 mg total) by mouth daily. 01/31/17   Glean Hess, MD  Blood Glucose Monitoring Suppl [facility] BLOOD GLUCOSE) w/Device KIT 1 each by Does not apply route daily. Patient not taking: Reported on 02/20/2017 12/06/15   Glean Hess, MD  donepezil (ARICEPT) 5 MG tablet TAKE ONE TABLET AT BEDTIME. 09/17/16   Glean Hess, MD  glucose blood (TRUETRACK TEST) test strip Use to test blood sugar up to 4 times per day Patient not taking: Reported on 02/20/2017 01/31/17   Glean Hess, MD  glucose blood test strip 1 each by Other route as needed. Use as instructed  pt using [employer] glucometer.    [provider]  insulin detemir (LEVEMIR) 100 UNIT/ML injection Inject 20 Units into the skin daily.     [provider]  Insulin Syringe-Needle U-100 (GLOBAL INJECT EASE INSULIN SYR) 30G X 1/2" 0.3 ML MISC USE ONCE A DAY FOR INSULIN 12/16/16   Glean Hess, MD  LEVEMIR 100 UNIT/ML injection INJECT 25 UNITS UNDER SKIN NIGHTLY. 01/27/17   Glean Hess, MD  lisinopril (PRINIVIL,ZESTRIL) 40 MG tablet Take 1 tablet (40 mg  total) by mouth daily. 01/31/17   Glean Hess, MD  metFORMIN (GLUCOPHAGE) 500 MG tablet TAKE (1) TABLET BY MOUTH TWICE DAILY WITH A MEAL 03/24/17   Glean Hess, MD  metoprolol succinate (TOPROL-XL) 50 MG 24 hr tablet TAKE ONE (1) TABLET BY MOUTH ONCE DAILY 12/16/16   Glean Hess, MD  omeprazole (PRILOSEC) 40 MG capsule TAKE ONE CAPSULE TWICE A DAY 01/27/17   Glean Hess, MD  oxyCODONE (ROXICODONE) 5 MG immediate release tablet Take 1 tablet (5 mg total) by mouth every 6 (six) hours as needed for moderate pain or severe pain. 04/05/17 04/05/18  Rudene Re, MD  potassium chloride (KLOR-CON) 20 MEQ packet Take 20 mEq by mouth daily. 01/31/17   Glean Hess, MD  potassium chloride SA (K-DUR,KLOR-CON) 20 MEQ tablet TAKE (1) TABLET BY MOUTH EVERY DAY 06/11/16   Glean Hess, MD  simvastatin (ZOCOR) 40 MG tablet Take 1 tablet (40 mg total) by mouth at bedtime. 12/16/16   Glean Hess, MD    Allergies Patient has no known allergies.  Family History  Problem Relation Age of Onset  . Diabetes Mother   . Hypertension Mother   . Diabetes Father   . Hypertension Father   . Breast cancer Neg Hx     Social History Social History  Substance Use Topics  . Smoking status: Current Every Day Smoker    Packs/day: 0.25    Years: 10.00    Types: Cigarettes    Last attempt to quit: 09/11/1982  . Smokeless tobacco: Current User    Types: Chew  . Alcohol use 0.6 oz/week    1 Cans of beer per week    Review of Systems Constitutional: Negative for fever. Eyes: Negative for visual changes. ENT: Negative for facial injury or neck injury Cardiovascular: Negative for chest injury. Respiratory: Negative for shortness of breath. Negative for chest wall injury. Gastrointestinal: Negative for abdominal pain or injury. Genitourinary: Negative for dysuria. Musculoskeletal: Negative for back injury, + R elbow pain Skin: Negative for laceration/abrasions. Neurological: Negative  for head injury.   ____________________________________________   PHYSICAL EXAM:  VITAL SIGNS: ED Triage Vitals  Enc Vitals Group     BP 04/05/17 1954 (!) 199/70     Pulse Rate 04/05/17 1954 64     Resp 04/05/17 1954 18     Temp 04/05/17 1954 97.8 F (36.6 C)     Temp Source 04/05/17 1954 Oral     SpO2 04/05/17 1954 100 %     Weight 04/05/17 1954 140 lb (63.5 kg)     Height 04/05/17 1954 _0  (1.702 m)     Head Circumference --      Peak Flow --      Pain Score 04/05/17 1953 10     Pain Loc --      Pain Edu? --      Excl. in [place]? --     Constitutional: Alert and oriented. No acute distress. Does not appear intoxicated. HEENT Head: Normocephalic  and atraumatic. Face: No facial bony tenderness. Stable midface Ears: No hemotympanum bilaterally. No Battle sign Eyes: No eye injury. PERRL. No raccoon eyes Nose: Nontender. No epistaxis. No rhinorrhea Mouth/Throat: Mucous membranes are moist. No oropharyngeal blood. No dental injury. Airway patent without stridor. Normal voice. Neck: no C-collar in place. No midline c-spine tenderness.  Cardiovascular: Normal rate, regular rhythm. Normal and symmetric distal pulses are present in all extremities. Pulmonary/Chest: Chest wall is stable and nontender to palpation/compression. Normal respiratory effort. Breath sounds are normal. No crepitus.  Abdominal: Soft, nontender, non distended. Musculoskeletal: Swelling and tenderness over the distal humerus, soft compartments, neurologically intact, strong distal pulses with brisk capillary refill. Nontender with normal full range of motion in all other extremities. No deformities. No thoracic or lumbar midline spinal tenderness. Pelvis is stable. Skin: Skin is warm, dry and intact. No abrasions or contutions. Psychiatric: Speech and behavior are appropriate. Neurological: Normal speech and language. Moves all extremities to command. No gross focal neurologic deficits are  appreciated.  Glascow Coma Score: 4 - Opens eyes on own 6 - Follows simple motor commands 5 - Alert and oriented GCS: 15  ____________________________________________   LABS (all labs ordered are listed, but only abnormal results are displayed)  Labs Reviewed - No data to display ____________________________________________  EKG  none  ____________________________________________  RADIOLOGY  XR R elbow:  Comminuted displaced supracondylar fracture of the humerus ____________________________________________   PROCEDURES  Procedure(s) performed: yes .Splint Application Date/Time: 0/08/5425 9:55 PM Performed by: Rudene Re Authorized by: Rudene Re   Consent:    Consent obtained:  Verbal   Consent given by:  Patient   Risks discussed:  Discoloration, numbness, pain and swelling Pre-procedure details:    Sensation:  Normal   Skin color:  Normal Procedure details:    Laterality:  Right   Location:  Elbow   Elbow:  R elbow   Cast type: posterior splint.   Supplies:  Ortho-Glass Post-procedure details:    Pain:  Improved   Sensation:  Normal   Skin color:  Normal   Patient tolerance of procedure:  Tolerated well, no immediate complications   Critical Care performed:  None ____________________________________________   INITIAL IMPRESSION / ASSESSMENT AND PLAN / ED COURSE   77 y.o. female who presents for evaluation of right elbow pain status post mechanical fall. X-ray concerning for a comminuted displaced supracondylar fracture of the humerus. No evidence of compartment syndrome with brisk capillary refill, and soft compartments. Neurovascularly intact. Discussed with Dr. Mack Guise, orthopedic surgeon on call who recommended posterior splint, sling, pain control, and close follow-up on Monday at the office. No other injuries based on history and exam. Discussed signs and symptoms of compartment syndrome and recommended the patient returns to the  emergency room if these develop. She is going to be discharged home with Percocet.     Pertinent labs & imaging results that were available during my care of the patient were reviewed by me and considered in my medical decision making (see chart for details).    ____________________________________________   FINAL CLINICAL IMPRESSION(S) / ED DIAGNOSES  Final diagnoses:  Fall, initial encounter  Closed supracondylar fracture of right humerus, initial encounter      NEW MEDICATIONS STARTED DURING THIS VISIT:  New Prescriptions   ACETAMINOPHEN (TYLENOL) 500 MG TABLET    Take 2 tablets (1,000 mg total) by mouth every 8 (eight) hours.   OXYCODONE (ROXICODONE) 5 MG IMMEDIATE RELEASE TABLET    Take 1 tablet (5 mg  total) by mouth every 6 (six) hours as needed for moderate pain or severe pain.     Note:  This document was prepared using Dragon voice recognition software and may include unintentional dictation errors.    Rudene Re, MD 04/05/17 2156

## 2017-04-05 NOTE — ED Notes (Signed)
X-ray at bedside

## 2017-04-05 NOTE — ED Triage Notes (Signed)
Pt to rm 12 via EMS from home, tripped and fell on loose rug, pain to right elbow, swelling noted, CSM intact

## 2017-04-08 ENCOUNTER — Other Ambulatory Visit: Payer: Self-pay | Admitting: *Deleted

## 2017-04-10 ENCOUNTER — Other Ambulatory Visit: Payer: Self-pay | Admitting: *Deleted

## 2017-04-15 ENCOUNTER — Ambulatory Visit (INDEPENDENT_AMBULATORY_CARE_PROVIDER_SITE_OTHER): Payer: Medicare HMO | Admitting: Internal Medicine

## 2017-04-15 ENCOUNTER — Encounter: Payer: Self-pay | Admitting: Internal Medicine

## 2017-04-15 ENCOUNTER — Other Ambulatory Visit
Admission: RE | Admit: 2017-04-15 | Discharge: 2017-04-15 | Disposition: A | Discharge status: Home / Self Care | Payer: Medicare HMO | Source: Ambulatory Visit | Attending: Internal Medicine | Admitting: Internal Medicine

## 2017-04-15 VITALS — BP 150/68 | HR 65 | Ht 63.0 in | Wt 135.0 lb

## 2017-04-15 DIAGNOSIS — I119 Hypertensive heart disease without heart failure: Secondary | ICD-10-CM

## 2017-04-15 DIAGNOSIS — Z794 Long term (current) use of insulin: Secondary | ICD-10-CM | POA: Diagnosis not present

## 2017-04-15 DIAGNOSIS — I10 Essential (primary) hypertension: Secondary | ICD-10-CM

## 2017-04-15 DIAGNOSIS — I1310 Hypertensive heart and chronic kidney disease without heart failure, with stage 1 through stage 4 chronic kidney disease, or unspecified chronic kidney disease: Secondary | ICD-10-CM | POA: Diagnosis not present

## 2017-04-15 DIAGNOSIS — E1122 Type 2 diabetes mellitus with diabetic chronic kidney disease: Secondary | ICD-10-CM | POA: Diagnosis not present

## 2017-04-15 DIAGNOSIS — N183 Chronic kidney disease, stage 3 (moderate): Secondary | ICD-10-CM | POA: Diagnosis not present

## 2017-04-15 DIAGNOSIS — S42491A Other displaced fracture of lower end of right humerus, initial encounter for closed fracture: Secondary | ICD-10-CM | POA: Diagnosis not present

## 2017-04-15 DIAGNOSIS — S42401A Unspecified fracture of lower end of right humerus, initial encounter for closed fracture: Secondary | ICD-10-CM | POA: Insufficient documentation

## 2017-04-15 DIAGNOSIS — I43 Cardiomyopathy in diseases classified elsewhere: Secondary | ICD-10-CM | POA: Insufficient documentation

## 2017-04-15 DIAGNOSIS — Z23 Encounter for immunization: Secondary | ICD-10-CM

## 2017-04-15 LAB — CBC WITH DIFFERENTIAL/PLATELET
BASOS ABS: 0 10*3/uL (ref 0–0.1)
Basophils Relative: 0 %
EOS PCT: 2 %
Eosinophils Absolute: 0.1 10*3/uL (ref 0–0.7)
HCT: 26.2 % — ABNORMAL LOW (ref 35.0–47.0)
Hemoglobin: 8.8 g/dL — ABNORMAL LOW (ref 12.0–16.0)
LYMPHS ABS: 1.4 10*3/uL (ref 1.0–3.6)
LYMPHS PCT: 26 %
MCH: 29.8 pg (ref 26.0–34.0)
MCHC: 33.7 g/dL (ref 32.0–36.0)
MCV: 88.2 fL (ref 80.0–100.0)
MONO ABS: 0.5 10*3/uL (ref 0.2–0.9)
Monocytes Relative: 9 %
Neutro Abs: 3.6 10*3/uL (ref 1.4–6.5)
Neutrophils Relative %: 63 %
PLATELETS: 312 10*3/uL (ref 150–440)
RBC: 2.96 MIL/uL — ABNORMAL LOW (ref 3.80–5.20)
RDW: 14.7 % — AB (ref 11.5–14.5)
WBC: 5.7 10*3/uL (ref 3.6–11.0)

## 2017-04-15 LAB — COMPREHENSIVE METABOLIC PANEL
ALK PHOS: 72 U/L (ref 38–126)
ALT: 20 U/L (ref 14–54)
ANION GAP: 5 (ref 5–15)
AST: 37 U/L (ref 15–41)
Albumin: 3.4 g/dL — ABNORMAL LOW (ref 3.5–5.0)
BUN: 16 mg/dL (ref 6–20)
CALCIUM: 8.8 mg/dL — AB (ref 8.9–10.3)
CHLORIDE: 104 mmol/L (ref 101–111)
CO2: 25 mmol/L (ref 22–32)
CREATININE: 1.38 mg/dL — AB (ref 0.44–1.00)
GFR calc non Af Amer: 37 mL/min — ABNORMAL LOW (ref >60)
GFR, EST AFRICAN AMERICAN: 43 mL/min — AB (ref >60)
GLUCOSE: 90 mg/dL (ref 65–99)
Potassium: 4.7 mmol/L (ref 3.5–5.1)
Sodium: 134 mmol/L — ABNORMAL LOW (ref 135–145)
Total Bilirubin: 0.6 mg/dL (ref 0.3–1.2)
Total Protein: 7.2 g/dL (ref 6.5–8.1)

## 2017-04-15 MED ORDER — GLUCOSE BLOOD VI STRP: 1.0000 | ORAL_STRIP | 3 refills | Status: DC | PRN

## 2017-04-15 MED ORDER — OXYCODONE HCL 5 MG PO TABS: 5.0000 mg | ORAL_TABLET | Freq: Four times a day (QID) | ORAL | 0 refills | Status: AC | PRN

## 2017-04-15 NOTE — Progress Notes (Signed)
Date:  04/15/2017   Name:  Alyssa [name]   DOB:  [birth year]   MRN:  [id number]   Chief Complaint: Diabetes (Been running good. ) and Hypertension Diabetes  She presents for her follow-up diabetic visit. She has type 2 diabetes mellitus. Pertinent negatives for hypoglycemia include no headaches or tremors. Pertinent negatives for diabetes include no chest pain, no fatigue, no polydipsia and no polyuria. Current diabetic treatment includes oral agent (monotherapy) and insulin injections. She is compliant with treatment most of the time. An ACE inhibitor/angiotensin II receptor blocker is being taken.  Hypertension  This is a chronic problem. Pertinent negatives include no chest pain, headaches, palpitations or shortness of breath. Past treatments include calcium channel blockers, beta blockers and ACE inhibitors. The current treatment provides significant improvement.   Lab Results  Component Value Date   HGBA1C 5.4 01/25/2017   Humerus Fracture - fell at home and broke her right arm.  She is in a soft wrap. The fracture was 10 days ago.  She still has not seen Ortho - appt is not yet made.  She ran out of pain meds today. Comminuted displaced supracondylar fracture of the humerus.  Review of Systems  Constitutional: Negative for appetite change, fatigue, fever and unexpected weight change.  HENT: Negative for tinnitus and trouble swallowing.   Eyes: Negative for visual disturbance.  Respiratory: Negative for cough, chest tightness and shortness of breath.   Cardiovascular: Negative for chest pain, palpitations and leg swelling.  Gastrointestinal: Negative for abdominal pain.  Endocrine: Negative for polydipsia and polyuria.  Genitourinary: Negative for dysuria and hematuria.  Musculoskeletal: Positive for arthralgias (arm pain from fx 10 days ago).  Skin: Negative for rash and wound.  Neurological: Negative for tremors, numbness and headaches.  Psychiatric/Behavioral: Positive for  sleep disturbance. Negative for dysphoric mood.    Patient Active Problem List   Diagnosis Date Noted  . History of falling 02/11/2017  . Hip pain, left 01/31/2017  . Hypoglycemia 01/25/2017  . Cardiomyopathy due to hypertension, without heart failure ([place]) 09/23/2016  . Cardiac murmur 06/03/2016  . Acute GI bleeding 03/05/2016  . Type 2 diabetes mellitus with stage 3 chronic kidney disease, with long-term current use of insulin ([place]) 12/06/2015  . Hyperlipidemia associated with type 2 diabetes mellitus ([place])   . Alzheimer's dementia without behavioral disturbance 03/07/2015  . Coronary artery disease involving native coronary artery of native heart without angina pectoris 12/02/2014  . Allergic rhinitis 11/15/2014  . Essential hypertension 11/15/2014  . Arthritis, degenerative 11/15/2014  . Hypokalemia 11/15/2014  . H/O gastrointestinal hemorrhage 03/28/2011    Prior to Admission medications   Medication Sig Start Date End Date Taking? Authorizing Provider  amLODipine (NORVASC) 10 MG tablet TAKE (1) TABLET BY MOUTH EVERY DAY 12/16/16   Glean Hess, MD  aspirin EC 81 MG tablet Take 1 tablet (81 mg total) by mouth daily. 01/31/17   Glean Hess, MD  Blood Glucose Monitoring Suppl [facility] BLOOD GLUCOSE) w/Device KIT 1 each by Does not apply route daily. Patient not taking: Reported on 02/20/2017 12/06/15   Glean Hess, MD  donepezil (ARICEPT) 5 MG tablet TAKE ONE TABLET AT BEDTIME. 09/17/16   Glean Hess, MD  glucose blood (TRUETRACK TEST) test strip Use to test blood sugar up to 4 times per day Patient not taking: Reported on 02/20/2017 01/31/17   Glean Hess, MD  glucose blood test strip 1 each by Other route as needed. Use as instructed  pt using Reli On glucometer.    [provider]  insulin detemir (LEVEMIR) 100 UNIT/ML injection Inject 20 Units into the skin daily.     [provider]  Insulin Syringe-Needle U-100 (GLOBAL INJECT EASE  INSULIN SYR) 30G X 1/2" 0.3 ML MISC USE ONCE A DAY FOR INSULIN 12/16/16   Glean Hess, MD  LEVEMIR 100 UNIT/ML injection INJECT 25 UNITS UNDER SKIN NIGHTLY. 01/27/17   Glean Hess, MD  lisinopril (PRINIVIL,ZESTRIL) 40 MG tablet Take 1 tablet (40 mg total) by mouth daily. 01/31/17   Glean Hess, MD  metFORMIN (GLUCOPHAGE) 500 MG tablet TAKE (1) TABLET BY MOUTH TWICE DAILY WITH A MEAL 03/24/17   Glean Hess, MD  metoprolol succinate (TOPROL-XL) 50 MG 24 hr tablet TAKE ONE (1) TABLET BY MOUTH ONCE DAILY 12/16/16   Glean Hess, MD  omeprazole (PRILOSEC) 40 MG capsule TAKE ONE CAPSULE TWICE A DAY 01/27/17   Glean Hess, MD  oxyCODONE (ROXICODONE) 5 MG immediate release tablet Take 1 tablet (5 mg total) by mouth every 6 (six) hours as needed for moderate pain or severe pain. 04/05/17 04/05/18  Rudene Re, MD  potassium chloride (KLOR-CON) 20 MEQ packet Take 20 mEq by mouth daily. 01/31/17   Glean Hess, MD  potassium chloride SA (K-DUR,KLOR-CON) 20 MEQ tablet TAKE (1) TABLET BY MOUTH EVERY DAY 06/11/16   Glean Hess, MD  simvastatin (ZOCOR) 40 MG tablet Take 1 tablet (40 mg total) by mouth at bedtime. 12/16/16   Glean Hess, MD    No Known Allergies  Past Surgical History:  Procedure Laterality Date  . ABDOMINAL HYSTERECTOMY    . CHOLECYSTECTOMY    . TONSILLECTOMY      Social History  Substance Use Topics  . Smoking status: Current Every Day Smoker    Packs/day: 0.25    Years: 10.00    Types: Cigarettes  . Smokeless tobacco: Current User    Types: Chew  . Alcohol use 0.6 oz/week    1 Cans of beer per week     Medication list has been reviewed and updated.  PHQ 2/9 Scores 01/02/2017 12/12/2016 12/11/2016 04/11/2016  PHQ - 2 Score 0 - 0 0  PHQ- 9 Score - - - -  Exception Documentation - Other- indicate reason in comment box - -  Not completed - call completed w/ dtr - -    Physical Exam  Constitutional: She is oriented to person, place, and  time. She appears well-developed. No distress.  HENT:  Head: Normocephalic and atraumatic.  Cardiovascular: Normal rate and regular rhythm.   Murmur heard.  Systolic murmur is present with a grade of 2/6  Pulmonary/Chest: Effort normal and breath sounds normal. No respiratory distress. She has no wheezes.  Musculoskeletal: She exhibits no edema.       Arms: Neurological: She is alert and oriented to person, place, and time. No cranial nerve deficit or sensory deficit.  Skin: Skin is warm and dry. No rash noted.  Psychiatric: She has a normal mood and affect. Her speech is normal and behavior is normal. Thought content normal.  Nursing note and vitals reviewed.   BP (!) 150/68   Pulse 65   Ht _0  (1.6 m)   Wt 135 lb (61.2 kg)   SpO2 99%   BMI 23.91 kg/m   Assessment and Plan: 1. Essential hypertension Fair control - elevated due to pain - CBC with Differential/Platelet  2. Type 2 diabetes mellitus  with stage 3 chronic kidney disease, with long-term current use of insulin (HCC) Continue current therapy - Comprehensive metabolic panel - Hemoglobin A1c - glucose blood test strip; 1 each by Other route as needed. Relion Prime  Dispense: 100 each; Refill: 3 - AMB Referral to [place] [facility]  3. Cardiomyopathy due to hypertension, without heart failure (HCC) Stable without chest pain or SOB but patient is sedentary  4. Need for influenza vaccination - Flu Vaccine QUAD 36+ mos IM  5. Other closed displaced fracture of distal end of right humerus, initial encounter Daughter instructed to schedule Ortho appt today Additional pain medications given Elevate as much as possible to reduce swelling - oxyCODONE (ROXICODONE) 5 MG immediate release tablet; Take 1 tablet (5 mg total) by mouth every 6 (six) hours as needed for moderate pain or severe pain.  Dispense: 20 tablet; Refill: 0 - AMB Referral to [place] Management   Meds ordered this encounter  Medications  . oxyCODONE  (ROXICODONE) 5 MG immediate release tablet    Sig: Take 1 tablet (5 mg total) by mouth every 6 (six) hours as needed for moderate pain or severe pain.    Dispense:  20 tablet    Refill:  0  . glucose blood test strip    Sig: 1 each by Other route as needed. Relion Prime    Dispense:  100 each    Refill:  3    Partially dictated using Editor, commissioning. Any errors are unintentional.  Halina Maidens, MD [facility]  04/15/2017

## 2017-04-16 DIAGNOSIS — S42411A Displaced simple supracondylar fracture without intercondylar fracture of right humerus, initial encounter for closed fracture: Secondary | ICD-10-CM | POA: Diagnosis not present

## 2017-04-17 ENCOUNTER — Ambulatory Visit
Admission: RE | Admit: 2017-04-17 | Discharge: 2017-04-17 | Disposition: A | Discharge status: Home / Self Care | Payer: Medicare HMO | Source: Ambulatory Visit | Attending: Orthopaedic Surgery | Admitting: Orthopaedic Surgery

## 2017-04-17 ENCOUNTER — Other Ambulatory Visit: Payer: Self-pay | Admitting: Orthopaedic Surgery

## 2017-04-17 DIAGNOSIS — S42411A Displaced simple supracondylar fracture without intercondylar fracture of right humerus, initial encounter for closed fracture: Secondary | ICD-10-CM | POA: Diagnosis not present

## 2017-04-17 DIAGNOSIS — S42471A Displaced transcondylar fracture of right humerus, initial encounter for closed fracture: Secondary | ICD-10-CM | POA: Diagnosis not present

## 2017-04-17 LAB — HEMOGLOBIN A1C
HEMOGLOBIN A1C: 5.3 % (ref 4.8–5.6)
MEAN PLASMA GLUCOSE: 105.41 mg/dL

## 2017-04-18 ENCOUNTER — Encounter: Payer: Self-pay | Admitting: *Deleted

## 2017-04-18 NOTE — Patient Outreach (Signed)
RN CM to discharge pt from [facility] services due to unable to reach pt's daughter Alyssa [name] or Alyssa [name] (granddaughter), pt has Dementia.   3 calls made to daughter, one to granddaughter- no responses so  unable to contact letter sent to pt's home- no response in 10 business days.    Plan:   RN CM to discharge pt from [facility]- unable to contact.            Plan to inform Chrystal THN LCSW of discharge.               Plan to inform Dr. Army Melia of discharge- to send Discipline closure letter.    Zara Chess.   [place] Care Management  [phone]

## 2017-04-21 ENCOUNTER — Other Ambulatory Visit: Payer: Self-pay | Admitting: *Deleted

## 2017-04-21 NOTE — Patient Outreach (Signed)
Late entry:   This  RN CM sent a case closure letter to Dr. Army Melia 04/18/17 informing of case closure.    reason for closure- 3 unsuccessful telephone encounters  to daughter [name], one unsuccessful     telephone encounter to granddaughter Ginny Forth so unable to contact letter sent to pt's home - no response after 10  Business days.   Received an in basket from Dr. Army Melia in response to case closure letter  To call daughter [name] Different phone number provided.    Plan:  RN CM to make another attempt to call daughter within the next 5 days.     Zara Chess.   [place] [facility] Management  [phone]  .

## 2017-04-22 ENCOUNTER — Other Ambulatory Visit: Payer: Self-pay | Admitting: *Deleted

## 2017-04-22 NOTE — Patient Outreach (Signed)
Unsuccessful telephone encounter to pt's daughter [name] (main contact, on consent form) with new number provided by PCP 04/18/17 via in basket in response to discipline closure letter sent by RN CM.   Reason for discipline closure letter sent to PCP- unable to contact pt via daughter Levander Campion and granddaughter Randell Patient.  Today unable to leave a voice message as message on daughter's phone- no voice message set up.    Plan:  RN CM to follow up again telephonically within next 3 days.    Zara Chess.   [place] Care Management  [phone]

## 2017-04-22 NOTE — Patient Outreach (Signed)
[place] [facility]) Care Management  04/22/2017  Alyssa [name] 08/26/1940 [id number]    Unsuccessful telephone call to patientt's daughter [name] (main contact, on consent form) with new number provided by [facility] to follow up on referral for the Congregational nutrition Program. The phone rng, however voicemail has not been set up.  Plan: This Education officer, museum will make an attempt to contact patient within 1 week.   [name] [facility] Care Management [phone]

## 2017-04-23 ENCOUNTER — Other Ambulatory Visit: Payer: Self-pay | Admitting: *Deleted

## 2017-04-23 NOTE — Patient Outreach (Signed)
[place] [facility]) Care Management  04/23/2017  JEILYN REZNIK Apr 15, 1941 [id number]   Phone call to [facility] nutrition coordinator for the congregational meal program. Message left requesting a return call with an update on the referral.   [place], [place] Management [phone]

## 2017-04-23 NOTE — Patient Outreach (Signed)
[place] [facility]) Care Management  04/23/2017  Alyssa [name] [birth year] [id number]   Phone call to patient's daughter on consent to assess for continued to social work needs for patient. This Education officer, museum discussed difficulty maintaining contact. Per patient's daughter, patient has broken her arm and they are in the midst of getting her personal care services through [facility] set up for her. Per patient's daughter, she had not heard from the congregational nutrition program.   Plan: This Education officer, museum will follow up with referral to the congregational nutrition program.   [name] [facility] Care Management [phone]

## 2017-04-24 ENCOUNTER — Other Ambulatory Visit: Payer: Self-pay | Admitting: *Deleted

## 2017-04-24 NOTE — Patient Outreach (Signed)
Second unsuccessful telephone encounter to daughter Lilly [place] (main contact, on consent) with new number provided by PCP 04/18/17 via in basket in response to discipline closure letter sent by RN CM.   Unable to leave a voice message on daughter phone as voice message not set up.    Zara Chess.   [place] Care Management  [phone]

## 2017-04-25 DIAGNOSIS — Z5309 Procedure and treatment not carried out because of other contraindication: Secondary | ICD-10-CM | POA: Diagnosis not present

## 2017-04-25 DIAGNOSIS — S42401A Unspecified fracture of lower end of right humerus, initial encounter for closed fracture: Secondary | ICD-10-CM | POA: Diagnosis not present

## 2017-04-25 DIAGNOSIS — F0390 Unspecified dementia without behavioral disturbance: Secondary | ICD-10-CM | POA: Diagnosis not present

## 2017-04-26 ENCOUNTER — Other Ambulatory Visit: Payer: Self-pay | Admitting: Internal Medicine

## 2017-04-28 ENCOUNTER — Other Ambulatory Visit: Payer: Self-pay | Admitting: *Deleted

## 2017-04-28 NOTE — Patient Outreach (Signed)
Third unsuccessful telephone encounter to daughter Alyssa [name] (main contact, on consent form) with new number provided by PCP 04/18/17 via in basket in response to discipline closure letter sent by RN CM.  Daughter's phone rang, no voice message set up.     Plan:  Plan to close case.            Plan to inform Dr. Army Melia via in basket of  unsuccessful telephone encounters,                  To close case.              Plan to inform  Chrystal THN LCSW of unable to contact daughter,close case.   Addendum:  Spoke with Chrystal THN LCSW informing her of 3 unsuccessful telephone encounters made to daughter Alyssa [name] (main contact, on pt's consent), plan to close case.     Zara Chess.   [place] Care Management  [phone]

## 2017-04-29 DIAGNOSIS — S42401D Unspecified fracture of lower end of right humerus, subsequent encounter for fracture with routine healing: Secondary | ICD-10-CM | POA: Diagnosis not present

## 2017-05-15 DIAGNOSIS — S42411D Displaced simple supracondylar fracture without intercondylar fracture of right humerus, subsequent encounter for fracture with routine healing: Secondary | ICD-10-CM | POA: Diagnosis not present

## 2017-05-15 DIAGNOSIS — M25621 Stiffness of right elbow, not elsewhere classified: Secondary | ICD-10-CM | POA: Diagnosis not present

## 2017-05-15 DIAGNOSIS — S42401D Unspecified fracture of lower end of right humerus, subsequent encounter for fracture with routine healing: Secondary | ICD-10-CM | POA: Diagnosis not present

## 2017-05-20 ENCOUNTER — Other Ambulatory Visit: Payer: Self-pay | Admitting: *Deleted

## 2017-05-20 NOTE — Patient Outreach (Addendum)
[place] [facility]) Care Management  05/20/2017  Alyssa [name] November 17, 1940 [id number]   Phone call to patient's daughter to follow up on community resource needs and continued need for the congregational nutrition  program as I have not been able to reach anyone there to follow up on the referral. Patient's daughter has a voicemail message that has not been set up, no message could be left. There was no answer to patient's phone as well  This social worker will try contacting patient's daughter and patient within 1 week,    [name] [facility] Care Management [phone]

## 2017-05-27 ENCOUNTER — Other Ambulatory Visit: Payer: Self-pay | Admitting: *Deleted

## 2017-05-27 ENCOUNTER — Ambulatory Visit: Payer: Self-pay | Admitting: *Deleted

## 2017-05-27 NOTE — Patient Outreach (Signed)
[place] [facility]) Care Management  05/27/2017  Alyssa [name] [birth year] [id number]   Phone call to patient;s daughter to follow up on community resource needs. Per patient's daughter, she would like continued social work support to assist with getting patient in home assistance. Per patient's daughter, she has received no call regarding in home support for patient.     Plan: This social worker will assist patient in completing the paperwork to apply for in home assistance.   [name] [facility] Care Management [phone]

## 2017-05-28 ENCOUNTER — Encounter: Payer: Self-pay | Admitting: *Deleted

## 2017-05-29 ENCOUNTER — Emergency Department
Admission: EM | Admit: 2017-05-29 | Discharge: 2017-05-29 | Disposition: A | Discharge status: Home / Self Care | Payer: Medicare HMO | Attending: Emergency Medicine | Admitting: Emergency Medicine

## 2017-05-29 DIAGNOSIS — E162 Hypoglycemia, unspecified: Secondary | ICD-10-CM | POA: Insufficient documentation

## 2017-05-29 DIAGNOSIS — Z79899 Other long term (current) drug therapy: Secondary | ICD-10-CM | POA: Diagnosis not present

## 2017-05-29 DIAGNOSIS — Z794 Long term (current) use of insulin: Secondary | ICD-10-CM | POA: Insufficient documentation

## 2017-05-29 DIAGNOSIS — N183 Chronic kidney disease, stage 3 (moderate): Secondary | ICD-10-CM | POA: Diagnosis not present

## 2017-05-29 DIAGNOSIS — F17210 Nicotine dependence, cigarettes, uncomplicated: Secondary | ICD-10-CM | POA: Insufficient documentation

## 2017-05-29 DIAGNOSIS — I130 Hypertensive heart and chronic kidney disease with heart failure and stage 1 through stage 4 chronic kidney disease, or unspecified chronic kidney disease: Secondary | ICD-10-CM | POA: Insufficient documentation

## 2017-05-29 DIAGNOSIS — E1122 Type 2 diabetes mellitus with diabetic chronic kidney disease: Secondary | ICD-10-CM | POA: Insufficient documentation

## 2017-05-29 DIAGNOSIS — E860 Dehydration: Secondary | ICD-10-CM | POA: Diagnosis not present

## 2017-05-29 DIAGNOSIS — I5022 Chronic systolic (congestive) heart failure: Secondary | ICD-10-CM | POA: Diagnosis not present

## 2017-05-29 DIAGNOSIS — I10 Essential (primary) hypertension: Secondary | ICD-10-CM | POA: Diagnosis not present

## 2017-05-29 DIAGNOSIS — E11649 Type 2 diabetes mellitus with hypoglycemia without coma: Secondary | ICD-10-CM | POA: Diagnosis not present

## 2017-05-29 LAB — CBC WITH DIFFERENTIAL/PLATELET
BASOS ABS: 0 10*3/uL (ref 0–0.1)
Basophils Relative: 0 %
Eosinophils Absolute: 0.1 10*3/uL (ref 0–0.7)
Eosinophils Relative: 2 %
HEMATOCRIT: 30.8 % — AB (ref 35.0–47.0)
Hemoglobin: 10.1 g/dL — ABNORMAL LOW (ref 12.0–16.0)
LYMPHS ABS: 0.9 10*3/uL — AB (ref 1.0–3.6)
LYMPHS PCT: 16 %
MCH: 29.3 pg (ref 26.0–34.0)
MCHC: 32.8 g/dL (ref 32.0–36.0)
MCV: 89.4 fL (ref 80.0–100.0)
MONO ABS: 0.4 10*3/uL (ref 0.2–0.9)
Monocytes Relative: 7 %
NEUTROS ABS: 4.2 10*3/uL (ref 1.4–6.5)
Neutrophils Relative %: 75 %
Platelets: 237 10*3/uL (ref 150–440)
RBC: 3.44 MIL/uL — ABNORMAL LOW (ref 3.80–5.20)
RDW: 15 % — AB (ref 11.5–14.5)
WBC: 5.7 10*3/uL (ref 3.6–11.0)

## 2017-05-29 LAB — URINALYSIS, COMPLETE (UACMP) WITH MICROSCOPIC
BACTERIA UA: NONE SEEN
BILIRUBIN URINE: NEGATIVE
Glucose, UA: NEGATIVE mg/dL
KETONES UR: NEGATIVE mg/dL
LEUKOCYTES UA: NEGATIVE
Nitrite: NEGATIVE
PH: 5 (ref 5.0–8.0)
Protein, ur: 100 mg/dL — AB
Specific Gravity, Urine: 1.005 (ref 1.005–1.030)

## 2017-05-29 LAB — BASIC METABOLIC PANEL
ANION GAP: 8 (ref 5–15)
BUN: 18 mg/dL (ref 6–20)
CALCIUM: 9 mg/dL (ref 8.9–10.3)
CO2: 22 mmol/L (ref 22–32)
Chloride: 106 mmol/L (ref 101–111)
Creatinine, Ser: 1.64 mg/dL — ABNORMAL HIGH (ref 0.44–1.00)
GFR calc Af Amer: 35 mL/min — ABNORMAL LOW (ref >60)
GFR calc non Af Amer: 30 mL/min — ABNORMAL LOW (ref >60)
GLUCOSE: 106 mg/dL — AB (ref 65–99)
POTASSIUM: 4.7 mmol/L (ref 3.5–5.1)
Sodium: 136 mmol/L (ref 135–145)

## 2017-05-29 LAB — GLUCOSE, CAPILLARY
GLUCOSE-CAPILLARY: 196 mg/dL — AB (ref 65–99)
Glucose-Capillary: 167 mg/dL — ABNORMAL HIGH (ref 65–99)
Glucose-Capillary: 127 mg/dL — ABNORMAL HIGH (ref 65–99)

## 2017-05-29 MED ORDER — SODIUM CHLORIDE 0.9 % IV BOLUS (SEPSIS)
500.0000 mL | Freq: Once | INTRAVENOUS | Status: AC
  Administered 2017-05-29: 500 mL via INTRAVENOUS

## 2017-05-29 MED ORDER — DEXTROSE-NACL 5-0.9 % IV SOLN: INTRAVENOUS | Status: DC

## 2017-05-29 NOTE — Telephone Encounter (Signed)
This encounter was created in error - please disregard.

## 2017-05-29 NOTE — ED Provider Notes (Signed)
[facility] Emergency Department Provider Note  ____________________________________________  Time seen: Approximately 4:53 PM  I have reviewed the triage vital signs and the nursing notes.   HISTORY  Chief Complaint Hypoglycemia   HPI Alyssa [name] is a 76 y.o. female with a history of diabetes, CHF, CKD, hypertension, hyperlipidemia, cardiomyopathy who presents for evaluation of hypoglycemia.history is gathered from patient's daughter since she has history of dementia and is unable to provide any history at this time. Daughter reports that she gave patient her levemir this morning at 8 AM and went to work. when she came home in the afternoon patient was very confused and was found to be hypoglycemic. She has had episodes like that in the past usually when patient forgets to eat. She was alone at home and is unclear if she ate anything today. Patient does not have access to her insulin and all meds controlled by the daughter. patient denies headache, nausea, vomiting, chest pain, shortness of breath, fever, cough, diarrhea, dysuria. Patient is hungry and asking for food. Patient received by mouth glucose per EMS.  Past Medical History:  Diagnosis Date  . Chronic systolic CHF (congestive heart failure) ([place])   . CKD (chronic kidney disease), stage III   . Essential hypertension   . Hyperlipidemia   . Hypertensive Non-ischemic Cardiomyopathy    a. 2010 Echo: EF 55%;  b. 2010 borderline MV w/ apical thinning; c. 2010 Cath: LCX 50p->Med Rx;  d. 09/2014 Echo: EF 35-40% w/ Sev LVH and glob HK;  e. 09/2014 MV: EF 30-44%, small fixed apical defect (breast attenuation), no ischemia.  . Hypokalemia   . Type II diabetes mellitus [facility])     Patient Active Problem List   Diagnosis Date Noted  . Closed fracture of right distal humerus 04/15/2017  . History of falling 02/11/2017  . Hip pain, left 01/31/2017  . Hypoglycemia 01/25/2017  . Cardiomyopathy due to  hypertension, without heart failure ([place]) 09/23/2016  . Cardiac murmur 06/03/2016  . Acute GI bleeding 03/05/2016  . Type 2 diabetes mellitus with stage 3 chronic kidney disease, with long-term current use of insulin ([place]) 12/06/2015  . Hyperlipidemia associated with type 2 diabetes mellitus ([place])   . Alzheimer's dementia without behavioral disturbance 03/07/2015  . Coronary artery disease involving native coronary artery of native heart without angina pectoris 12/02/2014  . Allergic rhinitis 11/15/2014  . Essential hypertension 11/15/2014  . Arthritis, degenerative 11/15/2014  . Hypokalemia 11/15/2014  . H/O gastrointestinal hemorrhage 03/28/2011    Past Surgical History:  Procedure Laterality Date  . ABDOMINAL HYSTERECTOMY    . CHOLECYSTECTOMY    . TONSILLECTOMY      Prior to Admission medications   Medication Sig Start Date End Date Taking? Authorizing Provider  amLODipine (NORVASC) 10 MG tablet TAKE (1) TABLET BY MOUTH EVERY DAY 12/16/16   Glean Hess, MD  Blood Glucose Monitoring Suppl (TRUETRACK BLOOD GLUCOSE) w/Device KIT 1 each by Does not apply route daily. 12/06/15   Glean Hess, MD  donepezil (ARICEPT) 5 MG tablet TAKE ONE TABLET AT BEDTIME. 09/17/16   Glean Hess, MD  glucose blood (TRUETRACK TEST) test strip Use to test blood sugar up to 4 times per day 01/31/17   Glean Hess, MD  glucose blood test strip 1 each by Other route as needed. Relion Prime 04/15/17   Glean Hess, MD  insulin detemir (LEVEMIR) 100 UNIT/ML injection Inject 20 Units into the skin daily.  [provider]  Insulin Syringe-Needle U-100 (GLOBAL INJECT EASE INSULIN SYR) 30G X 1/2" 0.3 ML MISC USE ONCE A DAY FOR INSULIN 12/16/16   Glean Hess, MD  lisinopril (PRINIVIL,ZESTRIL) 40 MG tablet Take 1 tablet (40 mg total) by mouth daily. 01/31/17   Glean Hess, MD  metFORMIN (GLUCOPHAGE) 500 MG tablet TAKE (1) TABLET BY MOUTH TWICE DAILY WITH A MEAL 03/24/17    Glean Hess, MD  metoprolol succinate (TOPROL-XL) 50 MG 24 hr tablet TAKE ONE (1) TABLET BY MOUTH ONCE DAILY 12/16/16   Glean Hess, MD  omeprazole (PRILOSEC) 40 MG capsule TAKE ONE CAPSULE TWICE A DAY 01/27/17   Glean Hess, MD  oxyCODONE (ROXICODONE) 5 MG immediate release tablet Take 1 tablet (5 mg total) by mouth every 6 (six) hours as needed for moderate pain or severe pain. 04/15/17 04/15/18  Glean Hess, MD  potassium chloride SA (K-DUR,KLOR-CON) 20 MEQ tablet TAKE (1) TABLET BY MOUTH EVERY DAY 06/11/16   Glean Hess, MD  simvastatin (ZOCOR) 40 MG tablet 1 TABLET BY MOUTH AT BEDTIME. 04/26/17   Glean Hess, MD    Allergies Patient has no known allergies.  Family History  Problem Relation Age of Onset  . Diabetes Mother   . Hypertension Mother   . Diabetes Father   . Hypertension Father   . Breast cancer Neg Hx     Social History Social History  Substance Use Topics  . Smoking status: Current Every Day Smoker    Packs/day: 0.25    Years: 10.00    Types: Cigarettes  . Smokeless tobacco: Current User    Types: Chew  . Alcohol use 0.6 oz/week    1 Cans of beer per week    Review of Systems  Constitutional: Negative for fever. Eyes: Negative for visual changes. ENT: Negative for sore throat. Neck: No neck pain  Cardiovascular: Negative for chest pain. Respiratory: Negative for shortness of breath. Gastrointestinal: Negative for abdominal pain, vomiting or diarrhea. Genitourinary: Negative for dysuria. Musculoskeletal: Negative for back pain. Skin: Negative for rash. Neurological: Negative for headaches, weakness or numbness. Psych: No SI or HI  ____________________________________________   PHYSICAL EXAM:  VITAL SIGNS: ED Triage Vitals  Enc Vitals Group     BP 05/29/17 1643 (!) 164/70     Pulse Rate 05/29/17 1645 67     Resp 05/29/17 1643 18     Temp 05/29/17 1645 98.5 F (36.9 C)     Temp Source 05/29/17 1645 Oral     SpO2  --      Weight 05/29/17 1644 138 lb (62.6 kg)     Height 05/29/17 1644 5' (1.524 m)     Head Circumference --      Peak Flow --      Pain Score --      Pain Loc --      Pain Edu? --      Excl. in [place]? --     Constitutional: Alert and oriented x2. Well appearing and in no apparent distress. HEENT:      Head: Normocephalic and atraumatic.         Eyes: Conjunctivae are normal. Sclera is non-icteric.       Mouth/Throat: Mucous membranes are moist.       Neck: Supple with no signs of meningismus. Cardiovascular: Regular rate and rhythm. No murmurs, gallops, or rubs. 2+ symmetrical distal pulses are present in all extremities. No JVD. Respiratory: Normal respiratory effort.  Lungs are clear to auscultation bilaterally. No wheezes, crackles, or rhonchi.  Gastrointestinal: Soft, non tender, and non distended with positive bowel sounds. No rebound or guarding. Musculoskeletal: Nontender with normal range of motion in all extremities. No edema, cyanosis, or erythema of extremities. Neurologic: Normal speech and language. Face is symmetric. Moving all extremities. No gross focal neurologic deficits are appreciated. Skin: Skin is warm, dry and intact. No rash noted. Psychiatric: Mood and affect are normal. Speech and behavior are normal.  ____________________________________________   LABS (all labs ordered are listed, but only abnormal results are displayed)  Labs Reviewed  GLUCOSE, CAPILLARY - Abnormal; Notable for the following:       Result Value   Glucose-Capillary 167 (*)    All other components within normal limits  CBC WITH DIFFERENTIAL/PLATELET - Abnormal; Notable for the following:    RBC 3.44 (*)    Hemoglobin 10.1 (*)    HCT 30.8 (*)    RDW 15.0 (*)    Lymphs Abs 0.9 (*)    All other components within normal limits  BASIC METABOLIC PANEL - Abnormal; Notable for the following:    Glucose, Bld 106 (*)    Creatinine, Ser 1.64 (*)    GFR calc non Af Amer 30 (*)    GFR calc Af  Amer 35 (*)    All other components within normal limits  URINALYSIS, COMPLETE (UACMP) WITH MICROSCOPIC - Abnormal; Notable for the following:    Color, Urine YELLOW (*)    APPearance CLEAR (*)    Hgb urine dipstick SMALL (*)    Protein, ur 100 (*)    Squamous Epithelial / LPF 0-5 (*)    All other components within normal limits  GLUCOSE, CAPILLARY - Abnormal; Notable for the following:    Glucose-Capillary 127 (*)    All other components within normal limits  GLUCOSE, CAPILLARY - Abnormal; Notable for the following:    Glucose-Capillary 196 (*)    All other components within normal limits  CBG MONITORING, ED  CBG MONITORING, ED  CBG MONITORING, ED  CBG MONITORING, ED   ____________________________________________  EKG  ED ECG REPORT I, Rudene Re, the attending physician, personally viewed and interpreted this ECG.  Normal sinus rhythm, rate of 65, normal intervals, normal axis, no ST elevations or depressions, T-wave inversion in lead 3 and flattening of lateral leads.unchanged from prior from June 2018 ____________________________________________  RADIOLOGY  none ____________________________________________   PROCEDURES  Procedure(s) performed: None Procedures Critical Care performed:  None ____________________________________________   INITIAL IMPRESSION / ASSESSMENT AND PLAN / ED COURSE   76 y.o. female with a history of diabetes, CHF, CKD, hypertension, hyperlipidemia, cardiomyopathy who presents for evaluation of hypoglycemia possibly from not eating anything today. Patient is at baseline and has no complaints other than being hungry. She is neuro intact, vitals WNL with no fever. EKG with no evidence of ischemia. Labs showing slightly increased creatinine, will give gentle IV hydration. BG has remained stable in the ED. Will check CBG q hour for 3 hours and if it remains stable plan for dc. UA pending to rule out UTI.    _________________________ 7:31  PM on 05/29/2017 -----------------------------------------  Patient had dinner in the ED. patient remains at baseline per daughter who is at the bedside. She is tolerating by mouth. Serial fingersticks showing normal glucose with no recurrence of hypoglycemia. There is no evidence of infection. Patient's condition discharge home to the care of her daughter. Discussed return precautions.  As part of  my medical decision making, I reviewed the following data within the [place] History obtained from family, Nursing notes reviewed and incorporated, Labs reviewed , EKG interpreted , Old EKG reviewed, Old chart reviewed, Notes from prior ED visits and [place] Controlled Substance Database    Pertinent labs & imaging results that were available during my care of the patient were reviewed by me and considered in my medical decision making (see chart for details).    ____________________________________________   FINAL CLINICAL IMPRESSION(S) / ED DIAGNOSES  Final diagnoses:  Hypoglycemia  Dehydration      NEW MEDICATIONS STARTED DURING THIS VISIT:  New Prescriptions   No medications on file     Note:  This document was prepared using Dragon voice recognition software and may include unintentional dictation errors.    Rudene Re, MD 05/29/17 [phone]

## 2017-05-29 NOTE — ED Triage Notes (Signed)
Lives at home by herself - family comes by and checks to make sure she takes her meds. Took her insulin this am but unsure if she actually ate her breakfast or lunch. Sugar was 69 for ems. Given oral glucose, a peanut butter sandwich, and soda. bs for ems afterwards was 273.

## 2017-06-03 ENCOUNTER — Encounter: Payer: Self-pay | Admitting: Emergency Medicine

## 2017-06-03 ENCOUNTER — Emergency Department
Admission: EM | Admit: 2017-06-03 | Discharge: 2017-06-04 | Disposition: A | Discharge status: Home / Self Care | Payer: Medicare HMO | Attending: Emergency Medicine | Admitting: Emergency Medicine

## 2017-06-03 ENCOUNTER — Other Ambulatory Visit: Payer: Self-pay

## 2017-06-03 DIAGNOSIS — F17210 Nicotine dependence, cigarettes, uncomplicated: Secondary | ICD-10-CM | POA: Diagnosis not present

## 2017-06-03 DIAGNOSIS — Z79899 Other long term (current) drug therapy: Secondary | ICD-10-CM | POA: Diagnosis not present

## 2017-06-03 DIAGNOSIS — I517 Cardiomegaly: Secondary | ICD-10-CM | POA: Diagnosis not present

## 2017-06-03 DIAGNOSIS — I5022 Chronic systolic (congestive) heart failure: Secondary | ICD-10-CM | POA: Insufficient documentation

## 2017-06-03 DIAGNOSIS — N183 Chronic kidney disease, stage 3 (moderate): Secondary | ICD-10-CM | POA: Insufficient documentation

## 2017-06-03 DIAGNOSIS — E162 Hypoglycemia, unspecified: Secondary | ICD-10-CM

## 2017-06-03 DIAGNOSIS — E11649 Type 2 diabetes mellitus with hypoglycemia without coma: Secondary | ICD-10-CM | POA: Diagnosis not present

## 2017-06-03 DIAGNOSIS — E10649 Type 1 diabetes mellitus with hypoglycemia without coma: Secondary | ICD-10-CM | POA: Insufficient documentation

## 2017-06-03 DIAGNOSIS — E1022 Type 1 diabetes mellitus with diabetic chronic kidney disease: Secondary | ICD-10-CM | POA: Diagnosis not present

## 2017-06-03 DIAGNOSIS — I130 Hypertensive heart and chronic kidney disease with heart failure and stage 1 through stage 4 chronic kidney disease, or unspecified chronic kidney disease: Secondary | ICD-10-CM | POA: Insufficient documentation

## 2017-06-03 DIAGNOSIS — Z794 Long term (current) use of insulin: Secondary | ICD-10-CM | POA: Diagnosis not present

## 2017-06-03 LAB — GLUCOSE, CAPILLARY: GLUCOSE-CAPILLARY: 167 mg/dL — AB (ref 65–99)

## 2017-06-03 NOTE — ED Triage Notes (Signed)
Pt coming from home via EMS with a CBG of 48. EMS gave amp D50 in IV and came up to 183 and was given OJ/Milk and PB&J. Patient is type 1 diabetic and might have some dementia per daughter.

## 2017-06-04 ENCOUNTER — Emergency Department: Payer: Medicare HMO

## 2017-06-04 DIAGNOSIS — I517 Cardiomegaly: Secondary | ICD-10-CM | POA: Diagnosis not present

## 2017-06-04 DIAGNOSIS — E10649 Type 1 diabetes mellitus with hypoglycemia without coma: Secondary | ICD-10-CM | POA: Diagnosis not present

## 2017-06-04 LAB — COMPREHENSIVE METABOLIC PANEL
ALBUMIN: 3.3 g/dL — AB (ref 3.5–5.0)
ALK PHOS: 58 U/L (ref 38–126)
ALT: 9 U/L — ABNORMAL LOW (ref 14–54)
ANION GAP: 8 (ref 5–15)
AST: 23 U/L (ref 15–41)
BUN: 12 mg/dL (ref 6–20)
CALCIUM: 8.6 mg/dL — AB (ref 8.9–10.3)
CHLORIDE: 105 mmol/L (ref 101–111)
CO2: 21 mmol/L — AB (ref 22–32)
Creatinine, Ser: 1.45 mg/dL — ABNORMAL HIGH (ref 0.44–1.00)
GFR calc Af Amer: 40 mL/min — ABNORMAL LOW (ref >60)
GFR calc non Af Amer: 35 mL/min — ABNORMAL LOW (ref >60)
GLUCOSE: 160 mg/dL — AB (ref 65–99)
POTASSIUM: 4.1 mmol/L (ref 3.5–5.1)
Sodium: 134 mmol/L — ABNORMAL LOW (ref 135–145)
Total Bilirubin: 0.6 mg/dL (ref 0.3–1.2)
Total Protein: 6.8 g/dL (ref 6.5–8.1)

## 2017-06-04 LAB — GLUCOSE, CAPILLARY
Glucose-Capillary: 76 mg/dL (ref 65–99)
Glucose-Capillary: 169 mg/dL — ABNORMAL HIGH (ref 65–99)
Glucose-Capillary: 168 mg/dL — ABNORMAL HIGH (ref 65–99)

## 2017-06-04 LAB — URINALYSIS, COMPLETE (UACMP) WITH MICROSCOPIC
BILIRUBIN URINE: NEGATIVE
GLUCOSE, UA: NEGATIVE mg/dL
KETONES UR: NEGATIVE mg/dL
LEUKOCYTES UA: NEGATIVE
NITRITE: NEGATIVE
PROTEIN: 100 mg/dL — AB
Specific Gravity, Urine: 1.003 — ABNORMAL LOW (ref 1.005–1.030)
pH: 5 (ref 5.0–8.0)

## 2017-06-04 LAB — CBC WITH DIFFERENTIAL/PLATELET
BASOS PCT: 0 %
Basophils Absolute: 0 10*3/uL (ref 0–0.1)
EOS ABS: 0.1 10*3/uL (ref 0–0.7)
Eosinophils Relative: 2 %
HCT: 30.4 % — ABNORMAL LOW (ref 35.0–47.0)
HEMOGLOBIN: 9.8 g/dL — AB (ref 12.0–16.0)
Lymphocytes Relative: 19 %
Lymphs Abs: 0.9 10*3/uL — ABNORMAL LOW (ref 1.0–3.6)
MCH: 28.6 pg (ref 26.0–34.0)
MCHC: 32.4 g/dL (ref 32.0–36.0)
MCV: 88.3 fL (ref 80.0–100.0)
MONOS PCT: 8 %
Monocytes Absolute: 0.4 10*3/uL (ref 0.2–0.9)
NEUTROS PCT: 71 %
Neutro Abs: 3.6 10*3/uL (ref 1.4–6.5)
PLATELETS: 237 10*3/uL (ref 150–440)
RBC: 3.44 MIL/uL — ABNORMAL LOW (ref 3.80–5.20)
RDW: 14.3 % (ref 11.5–14.5)
WBC: 5 10*3/uL (ref 3.6–11.0)

## 2017-06-04 LAB — TROPONIN I: Troponin I: <0.03 ng/mL (ref <0.03)

## 2017-06-04 LAB — ETHANOL

## 2017-06-04 NOTE — ED Notes (Signed)
Patient came in for hypoglycemia. Patient was given amp D50 by EMS and CBG was checked when arrived in hospital was 167. Patient placed on monitor. Patient denies pain or any injury.

## 2017-06-04 NOTE — ED Notes (Addendum)
Daughter diane called - [phone]

## 2017-06-04 NOTE — Discharge Instructions (Signed)
Check your blood sugar before giving insulin. Do not give insulin if your blood sugar is below 100. Remember to eat several small meals daily to keep your blood sugar up. Return to the ER for recurrent or worsening symptoms, persistent vomiting, difficulty breathing or other concerns.

## 2017-06-04 NOTE — ED Notes (Signed)
Nurse called daughter to come pick up patient from ED

## 2017-06-04 NOTE — ED Notes (Signed)
Patient was given 8oz of OJ and peanut butter crackers

## 2017-06-04 NOTE — ED Provider Notes (Signed)
[facility] Emergency Department Provider Note   ____________________________________________   First MD Initiated Contact with Patient 06/03/17 2355     (approximate)  I have reviewed the triage vital signs and the nursing notes.   HISTORY  Chief Complaint Hypoglycemia    HPI Alyssa [name] is a 76 y.o. female brought to the ED via EMS from home with a chief complaint of hypoglycemia. Patient is a type I diabetic, also with a history of CHF, CAD, hypertension, hyperlipidemia, cardiomyopathy who has dementia and sometimes forgets to eat. Patient was evaluated in the ED for similar presentation one week ago. Patient reports she did eat tonight and is fairly sure she did not accidentally give herself too much insulin. EMS reports patient is combative what her blood sugars low. She was given 1 amp D50 which brought her sugar from 40s to 120s. Patient denies fever, chills, chest pain, shortness breath, abdominal pain, nausea, vomiting, dysuria. Reports some loose bowel movements several days ago which has improved. Denies recent travel or trauma.   Past Medical History:  Diagnosis Date  . Chronic systolic CHF (congestive heart failure) ([place])   . CKD (chronic kidney disease), stage III ([place])   . Essential hypertension   . Hyperlipidemia   . Hypertensive Non-ischemic Cardiomyopathy    a. 2010 Echo: EF 55%;  b. 2010 borderline MV w/ apical thinning; c. 2010 Cath: LCX 50p->Med Rx;  d. 09/2014 Echo: EF 35-40% w/ Sev LVH and glob HK;  e. 09/2014 MV: EF 30-44%, small fixed apical defect (breast attenuation), no ischemia.  . Hypokalemia   . Type II diabetes mellitus [facility])     Patient Active Problem List   Diagnosis Date Noted  . Closed fracture of right distal humerus 04/15/2017  . History of falling 02/11/2017  . Hip pain, left 01/31/2017  . Hypoglycemia 01/25/2017  . Cardiomyopathy due to hypertension, without heart failure ([place]) 09/23/2016  . Cardiac murmur  06/03/2016  . Acute GI bleeding 03/05/2016  . Type 2 diabetes mellitus with stage 3 chronic kidney disease, with long-term current use of insulin ([place]) 12/06/2015  . Hyperlipidemia associated with type 2 diabetes mellitus ([place])   . Alzheimer's dementia without behavioral disturbance 03/07/2015  . Coronary artery disease involving native coronary artery of native heart without angina pectoris 12/02/2014  . Allergic rhinitis 11/15/2014  . Essential hypertension 11/15/2014  . Arthritis, degenerative 11/15/2014  . Hypokalemia 11/15/2014  . H/O gastrointestinal hemorrhage 03/28/2011    Past Surgical History:  Procedure Laterality Date  . ABDOMINAL HYSTERECTOMY    . CHOLECYSTECTOMY    . TONSILLECTOMY      Prior to Admission medications   Medication Sig Start Date End Date Taking? Authorizing Provider  amLODipine (NORVASC) 10 MG tablet TAKE (1) TABLET BY MOUTH EVERY DAY 12/16/16   Glean Hess, MD  Blood Glucose Monitoring Suppl (TRUETRACK BLOOD GLUCOSE) w/Device KIT 1 each by Does not apply route daily. 12/06/15   Glean Hess, MD  donepezil (ARICEPT) 5 MG tablet TAKE ONE TABLET AT BEDTIME. 09/17/16   Glean Hess, MD  glucose blood (TRUETRACK TEST) test strip Use to test blood sugar up to 4 times per day 01/31/17   Glean Hess, MD  glucose blood test strip 1 each by Other route as needed. Relion Prime 04/15/17   Glean Hess, MD  insulin detemir (LEVEMIR) 100 UNIT/ML injection Inject 20 Units into the skin daily.     [provider]  Insulin Syringe-Needle U-100 (  GLOBAL INJECT EASE INSULIN SYR) 30G X 1/2" 0.3 ML MISC USE ONCE A DAY FOR INSULIN 12/16/16   Glean Hess, MD  lisinopril (PRINIVIL,ZESTRIL) 40 MG tablet Take 1 tablet (40 mg total) by mouth daily. 01/31/17   Glean Hess, MD  metFORMIN (GLUCOPHAGE) 500 MG tablet TAKE (1) TABLET BY MOUTH TWICE DAILY WITH A MEAL 03/24/17   Glean Hess, MD  metoprolol succinate (TOPROL-XL) 50 MG 24 hr tablet  TAKE ONE (1) TABLET BY MOUTH ONCE DAILY 12/16/16   Glean Hess, MD  omeprazole (PRILOSEC) 40 MG capsule TAKE ONE CAPSULE TWICE A DAY 01/27/17   Glean Hess, MD  oxyCODONE (ROXICODONE) 5 MG immediate release tablet Take 1 tablet (5 mg total) by mouth every 6 (six) hours as needed for moderate pain or severe pain. 04/15/17 04/15/18  Glean Hess, MD  potassium chloride SA (K-DUR,KLOR-CON) 20 MEQ tablet TAKE (1) TABLET BY MOUTH EVERY DAY 06/11/16   Glean Hess, MD  simvastatin (ZOCOR) 40 MG tablet 1 TABLET BY MOUTH AT BEDTIME. 04/26/17   Glean Hess, MD    Allergies Patient has no known allergies.  Family History  Problem Relation Age of Onset  . Diabetes Mother   . Hypertension Mother   . Diabetes Father   . Hypertension Father   . Breast cancer Neg Hx     Social History Social History   Tobacco Use  . Smoking status: Current Every Day Smoker    Packs/day: 0.25    Years: 10.00    Pack years: 2.50    Types: Cigarettes  . Smokeless tobacco: Current User    Types: Chew  Substance Use Topics  . Alcohol use: Yes    Alcohol/week: 0.6 oz    Types: 1 Cans of beer per week  . Drug use: No    Review of Systems  Constitutional: Positive for hypoglycemia.No fever/chills. Eyes: No visual changes. ENT: No sore throat. Cardiovascular: Denies chest pain. Respiratory: Denies shortness of breath. Gastrointestinal: No abdominal pain.  No nausea, no vomiting.  No diarrhea.  No constipation. Genitourinary: Negative for dysuria. Musculoskeletal: Negative for back pain. Skin: Negative for rash. Neurological: Negative for headaches, focal weakness or numbness.   ____________________________________________   PHYSICAL EXAM:  VITAL SIGNS: ED Triage Vitals  Enc Vitals Group     BP 06/03/17 2343 (!) 158/82     Pulse Rate 06/03/17 2343 71     Resp 06/03/17 2343 17     Temp 06/03/17 2343 98.4 F (36.9 C)     Temp Source 06/03/17 2343 Oral     SpO2 06/03/17 2343  100 %     Weight 06/03/17 2344 138 lb (62.6 kg)     Height 06/03/17 2344 5' (1.524 m)     Head Circumference --      Peak Flow --      Pain Score 06/03/17 2343 0     Pain Loc --      Pain Edu? --      Excl. in [place]? --     Constitutional: Alert and oriented. Well appearing and in no acute distress. Eyes: Conjunctivae are normal. PERRL. EOMI. Head: Atraumatic. Nose: No congestion/rhinnorhea. Mouth/Throat: Mucous membranes are moist.  Oropharynx non-erythematous. Neck: No stridor.  No carotid bruits. Cardiovascular: Normal rate, regular rhythm. Grossly normal heart sounds.  Good peripheral circulation. Respiratory: Normal respiratory effort.  No retractions. Lungs CTAB. Gastrointestinal: Soft and nontender. No distention. No abdominal bruits. No CVA tenderness. Musculoskeletal: No  lower extremity tenderness nor edema.  No joint effusions. Neurologic:  Alert and oriented to person and place. Normal speech and language. No gross focal neurologic deficits are appreciated. MAEx4. Skin:  Skin is warm, dry and intact. No rash noted. Psychiatric: Mood and affect are normal. Speech and behavior are normal.  ____________________________________________   LABS (all labs ordered are listed, but only abnormal results are displayed)  Labs Reviewed  CBC WITH DIFFERENTIAL/PLATELET - Abnormal; Notable for the following components:      Result Value   RBC 3.44 (*)    Hemoglobin 9.8 (*)    HCT 30.4 (*)    Lymphs Abs 0.9 (*)    All other components within normal limits  COMPREHENSIVE METABOLIC PANEL - Abnormal; Notable for the following components:   Sodium 134 (*)    CO2 21 (*)    Glucose, Bld 160 (*)    Creatinine, Ser 1.45 (*)    Calcium 8.6 (*)    Albumin 3.3 (*)    ALT 9 (*)    GFR calc non Af Amer 35 (*)    GFR calc Af Amer 40 (*)    All other components within normal limits  URINALYSIS, COMPLETE (UACMP) WITH MICROSCOPIC - Abnormal; Notable for the following components:   Color,  Urine STRAW (*)    APPearance CLEAR (*)    Specific Gravity, Urine 1.003 (*)    Hgb urine dipstick SMALL (*)    Protein, ur 100 (*)    Bacteria, UA RARE (*)    Squamous Epithelial / LPF 0-5 (*)    All other components within normal limits  GLUCOSE, CAPILLARY - Abnormal; Notable for the following components:   Glucose-Capillary 167 (*)    All other components within normal limits  GLUCOSE, CAPILLARY - Abnormal; Notable for the following components:   Glucose-Capillary 169 (*)    All other components within normal limits  TROPONIN I  ETHANOL  GLUCOSE, CAPILLARY   ____________________________________________  EKG  ED ECG REPORT I, SUNG,JADE J, the attending physician, personally viewed and interpreted this ECG.   Date: 06/04/2017  EKG Time: 0018  Rate: 74  Rhythm: normal EKG, normal sinus rhythm  Axis: normal  Intervals:none  ST&T Change: nonspecific  ____________________________________________  RADIOLOGY  Dg Chest Port 1 View  Result Date: 06/04/2017 CLINICAL DATA:  Hypoglycemia EXAM: PORTABLE CHEST 1 VIEW COMPARISON:  01/25/2017 FINDINGS: Borderline cardiomegaly. No consolidation or effusion. Aortic atherosclerosis. No pneumothorax IMPRESSION: No active disease.  Borderline cardiomegaly Electronically Signed   By: Donavan Foil M.D.   On: 06/04/2017 00:19    ____________________________________________   PROCEDURES  Procedure(s) performed: None  Procedures  Critical Care performed: No  ____________________________________________   INITIAL IMPRESSION / ASSESSMENT AND PLAN / ED COURSE  As part of my medical decision making, I reviewed the following data within the [facility] notes reviewed and incorporated, Labs reviewed, EKG interpreted  and Notes from prior ED visits.   76 year old female with type 1 diabetes who presents for hypoglycemia. Blood sugar improved from the 40s to 120s with 1 amp D50 and food. Differential diagnosis  includes but is not limited to infectious, metabolic, cardiac, neurologic etiologies. Will obtain screening lab work, urinalysis, chest x-ray and reassess.  Clinical Course as of Jun 05 455  Wed Jun 04, 2017  0233 FSBS 76. Patient asymptomatic. Will give orange juice, snack and recheck.  [JS]  5427 Subsequent blood sugars have been 169 and now 168. Patient resting in no acute  distress. Voices no complaints. Will discharge home with daughter with close follow-up with her PCP. Strict return precautions given. Patient verbalizes understanding and agrees with plan of care.  [JS]    Clinical Course User Index [JS] Paulette Blanch, MD     ____________________________________________   FINAL CLINICAL IMPRESSION(S) / ED DIAGNOSES  Final diagnoses:  Hypoglycemia     ED Discharge Orders    None       Note:  This document was prepared using Dragon voice recognition software and may include unintentional dictation errors.    Paulette Blanch, MD 06/04/17 [phone]

## 2017-06-06 ENCOUNTER — Other Ambulatory Visit: Payer: Self-pay | Admitting: *Deleted

## 2017-06-09 NOTE — Patient Outreach (Signed)
[facility]) Care Management  06/09/2017  Alyssa [name] 1940-12-29 [id number]   Phone call to patient and patient's daughter on 06/06/17 to follow up on the congregational meal program and personal care services. Per patient and daughter, she still has not heard anything form the congregational meal program even after multiple calls and emails from this Education officer, museum.  Patient's daughter now requesting assistance with in home help for patient.    Plan: This Education officer, museum will send the request form for an independent assessment for personal care services to patient's providers office for completion.   [name] [facility] Care Management [phone]

## 2017-06-16 ENCOUNTER — Other Ambulatory Visit: Payer: Self-pay | Admitting: *Deleted

## 2017-06-16 NOTE — Patient Outreach (Signed)
[facility]) Care Management  06/16/2017  Alyssa [name] 23-Oct-1940 [id number]   Request form for an Independent Assessment for personal care services faxed to patient's provider for completion.   [name] [facility] Care Management [phone]

## 2017-06-23 ENCOUNTER — Other Ambulatory Visit: Payer: Self-pay | Admitting: *Deleted

## 2017-06-23 NOTE — Patient Outreach (Signed)
Alyssa [name]) Care Management  06/23/2017  Alyssa [name] [birth year] [id number]   Request form for an Independent Assessment for personal care services received from patient's medical provider. Form faxed to [facility] [phone]. Patient's daughter to be informed that the form was faxed and to expect a call from [facility] to schedule a assessment.    [name] [facility] Care Management [phone]

## 2017-06-24 ENCOUNTER — Other Ambulatory Visit: Payer: Self-pay | Admitting: *Deleted

## 2017-06-24 NOTE — Patient Outreach (Addendum)
[facility]) Care Management  06/24/2017  Alyssa [name] [birth year] [id number]   Phone call to intake coordinator of the congregate meal program [name] to follow up on referral for patient to attend their lunch program. Voicemail message left for a return call.   [name] [facility] Care Management [phone]

## 2017-06-26 ENCOUNTER — Ambulatory Visit: Payer: Self-pay | Admitting: *Deleted

## 2017-06-27 ENCOUNTER — Other Ambulatory Visit: Payer: Self-pay | Admitting: *Deleted

## 2017-06-27 NOTE — Patient Outreach (Signed)
[facility]) Care Management  06/27/2017  Alyssa [name] 12-19-1940 [id number]   Phone call to patient's daughter to follow up on request for personal care services. Per patient's daughter, she received a call from a nurse through [facility]. The assessment is schedudled for 07/08/17 at 11:00am.    Plan: This Education officer, museum will follow up with patient and here daughter withn 1 month regarding personal care services.   Alyssa [name] [facility] Care Management [phone]

## 2017-06-27 NOTE — Patient Outreach (Signed)
[facility]) Care Management  06/27/2017  VENORA KAUTZMAN 1941/06/15 [id number]   Late Entry-Phone call to patient and patient's daughter to inform her of completed request form for personal care services faxed to [facility] and to expect a call from a nurse to complete the assessment. Patient and patient's daughter not available, person that answered the phone requested that I call back on 06/27/17 around 4pm.   Pan: This social will call patient and patient's daughter back on 11/301/18.   [name] [facility] Care Management [phone]

## 2017-07-01 DIAGNOSIS — S42411D Displaced simple supracondylar fracture without intercondylar fracture of right humerus, subsequent encounter for fracture with routine healing: Secondary | ICD-10-CM | POA: Diagnosis not present

## 2017-07-05 ENCOUNTER — Other Ambulatory Visit: Payer: Self-pay | Admitting: Internal Medicine

## 2017-07-11 ENCOUNTER — Encounter: Payer: Self-pay | Admitting: *Deleted

## 2017-07-11 ENCOUNTER — Other Ambulatory Visit: Payer: Self-pay | Admitting: *Deleted

## 2017-07-11 ENCOUNTER — Ambulatory Visit: Payer: Self-pay | Admitting: *Deleted

## 2017-07-11 NOTE — Patient Outreach (Signed)
[facility]) Care Management  07/11/2017  Alyssa [name] [birth year] [id number]   Phone call to patient's daughter, [name] on [facility] consent to confirm the start of personal care services. Per patient's daughter, personal care services has been approved through [facility]. Patient has been approved 18 hours per month and will be using [facility] by an [facility].  Patient's daughter very appreciative of the assistance provided to arrange this and has verbalized no additional community resource needs.  Patient to be closed at this time to [facility] care management.   [name] [facility] Care Management [phone]

## 2017-07-24 DIAGNOSIS — G309 Alzheimer's disease, unspecified: Secondary | ICD-10-CM | POA: Diagnosis not present

## 2017-07-25 DIAGNOSIS — G309 Alzheimer's disease, unspecified: Secondary | ICD-10-CM | POA: Diagnosis not present

## 2017-07-28 DIAGNOSIS — G309 Alzheimer's disease, unspecified: Secondary | ICD-10-CM | POA: Diagnosis not present

## 2017-07-30 DIAGNOSIS — G309 Alzheimer's disease, unspecified: Secondary | ICD-10-CM | POA: Diagnosis not present

## 2017-07-31 DIAGNOSIS — G309 Alzheimer's disease, unspecified: Secondary | ICD-10-CM | POA: Diagnosis not present

## 2017-08-04 DIAGNOSIS — G309 Alzheimer's disease, unspecified: Secondary | ICD-10-CM | POA: Diagnosis not present

## 2017-08-05 DIAGNOSIS — G309 Alzheimer's disease, unspecified: Secondary | ICD-10-CM | POA: Diagnosis not present

## 2017-08-06 DIAGNOSIS — G309 Alzheimer's disease, unspecified: Secondary | ICD-10-CM | POA: Diagnosis not present

## 2017-08-07 DIAGNOSIS — G309 Alzheimer's disease, unspecified: Secondary | ICD-10-CM | POA: Diagnosis not present

## 2017-08-08 DIAGNOSIS — G309 Alzheimer's disease, unspecified: Secondary | ICD-10-CM | POA: Diagnosis not present

## 2017-08-11 DIAGNOSIS — G309 Alzheimer's disease, unspecified: Secondary | ICD-10-CM | POA: Diagnosis not present

## 2017-08-13 DIAGNOSIS — G309 Alzheimer's disease, unspecified: Secondary | ICD-10-CM | POA: Diagnosis not present

## 2017-08-14 DIAGNOSIS — G309 Alzheimer's disease, unspecified: Secondary | ICD-10-CM | POA: Diagnosis not present

## 2017-08-15 ENCOUNTER — Ambulatory Visit: Payer: Medicare HMO | Admitting: Internal Medicine

## 2017-08-15 DIAGNOSIS — G309 Alzheimer's disease, unspecified: Secondary | ICD-10-CM | POA: Diagnosis not present

## 2017-08-18 ENCOUNTER — Other Ambulatory Visit
Admission: RE | Admit: 2017-08-18 | Discharge: 2017-08-18 | Disposition: A | Discharge status: Home / Self Care | Payer: Medicare HMO | Source: Ambulatory Visit | Attending: Internal Medicine | Admitting: Internal Medicine

## 2017-08-18 ENCOUNTER — Ambulatory Visit (INDEPENDENT_AMBULATORY_CARE_PROVIDER_SITE_OTHER): Payer: Medicare HMO | Admitting: Internal Medicine

## 2017-08-18 ENCOUNTER — Encounter: Payer: Self-pay | Admitting: Internal Medicine

## 2017-08-18 VITALS — BP 118/70 | HR 61 | Ht 60.0 in | Wt 138.0 lb

## 2017-08-18 DIAGNOSIS — F0280 Dementia in other diseases classified elsewhere without behavioral disturbance: Secondary | ICD-10-CM

## 2017-08-18 DIAGNOSIS — Z79899 Other long term (current) drug therapy: Secondary | ICD-10-CM | POA: Diagnosis not present

## 2017-08-18 DIAGNOSIS — I43 Cardiomyopathy in diseases classified elsewhere: Secondary | ICD-10-CM

## 2017-08-18 DIAGNOSIS — I1310 Hypertensive heart and chronic kidney disease without heart failure, with stage 1 through stage 4 chronic kidney disease, or unspecified chronic kidney disease: Secondary | ICD-10-CM | POA: Insufficient documentation

## 2017-08-18 DIAGNOSIS — Z794 Long term (current) use of insulin: Secondary | ICD-10-CM | POA: Diagnosis not present

## 2017-08-18 DIAGNOSIS — R011 Cardiac murmur, unspecified: Secondary | ICD-10-CM | POA: Diagnosis not present

## 2017-08-18 DIAGNOSIS — I10 Essential (primary) hypertension: Secondary | ICD-10-CM | POA: Diagnosis not present

## 2017-08-18 DIAGNOSIS — G309 Alzheimer's disease, unspecified: Secondary | ICD-10-CM

## 2017-08-18 DIAGNOSIS — I119 Hypertensive heart disease without heart failure: Secondary | ICD-10-CM | POA: Diagnosis not present

## 2017-08-18 DIAGNOSIS — E1122 Type 2 diabetes mellitus with diabetic chronic kidney disease: Secondary | ICD-10-CM | POA: Diagnosis not present

## 2017-08-18 DIAGNOSIS — N183 Chronic kidney disease, stage 3 (moderate): Secondary | ICD-10-CM | POA: Diagnosis not present

## 2017-08-18 LAB — BASIC METABOLIC PANEL
ANION GAP: 5 (ref 5–15)
BUN: 14 mg/dL (ref 6–20)
CALCIUM: 8.4 mg/dL — AB (ref 8.9–10.3)
CO2: 25 mmol/L (ref 22–32)
Chloride: 104 mmol/L (ref 101–111)
Creatinine, Ser: 1.4 mg/dL — ABNORMAL HIGH (ref 0.44–1.00)
GFR calc Af Amer: 42 mL/min — ABNORMAL LOW (ref >60)
GFR, EST NON AFRICAN AMERICAN: 36 mL/min — AB (ref >60)
GLUCOSE: 105 mg/dL — AB (ref 65–99)
Potassium: 4.3 mmol/L (ref 3.5–5.1)
SODIUM: 134 mmol/L — AB (ref 135–145)

## 2017-08-18 LAB — HEMOGLOBIN A1C
HEMOGLOBIN A1C: 5.9 % — AB (ref 4.8–5.6)
Mean Plasma Glucose: 122.63 mg/dL

## 2017-08-18 MED ORDER — GLUCOSE BLOOD VI STRP: ORAL_STRIP | 12 refills | Status: AC

## 2017-08-18 NOTE — Progress Notes (Signed)
Date:  08/18/2017   Name:  Alyssa [name]   DOB:  [birth year]   MRN:  [id number]   Chief Complaint: Diabetes (Needs ReliOn Prime test strips. Needs it sent to [facility] in [place]. [facility] does not carry those test strips. ) and Hypertension  Diabetes  She presents for her follow-up diabetic visit. She has type 2 diabetes mellitus. Her disease course has been stable. Hypoglycemia symptoms include confusion. Pertinent negatives for hypoglycemia include no dizziness, headaches, nervousness/anxiousness or pallor. Pertinent negatives for diabetes include no chest pain and no fatigue.  Hypertension  This is a chronic problem. The problem is controlled. Pertinent negatives include no chest pain, headaches, palpitations or shortness of breath.  Dementia - per daughter, her memory is stable.  She now has help 5 days per week for 5 hours to help in the home.  Alyssa [name] has no behavior issues, sleeps well and has a good appetite. Humerus fx - has follow up this week with Ortho.  Wearing a short elbow brace.  She denies significant pain but is still guarding. CM - followed by cardiology.  No SOB, CP, Edema.  Taking medications as prescribed.  Has not been seen in about one year.   Review of Systems  Constitutional: Negative for appetite change, chills, fatigue, fever and unexpected weight change.  Respiratory: Negative for cough, chest tightness, shortness of breath and wheezing.   Cardiovascular: Negative for chest pain and palpitations.  Gastrointestinal: Negative for abdominal pain and diarrhea.  Genitourinary: Negative for dysuria and frequency.  Musculoskeletal: Positive for arthralgias (right arm pain from humerus fx).  Skin: Negative for pallor and rash.  Neurological: Negative for dizziness and headaches.  Hematological: Negative for adenopathy.  Psychiatric/Behavioral: Positive for confusion. Negative for dysphoric mood and sleep disturbance. The patient is not nervous/anxious.     Patient  Active Problem List   Diagnosis Date Noted  . Closed fracture of right distal humerus 04/15/2017  . History of falling 02/11/2017  . Hip pain, left 01/31/2017  . Hypoglycemia 01/25/2017  . Cardiomyopathy due to hypertension, without heart failure (Alyssa [name]) 09/23/2016  . Cardiac murmur 06/03/2016  . Acute GI bleeding 03/05/2016  . Type 2 diabetes mellitus with stage 3 chronic kidney disease, with long-term current use of insulin (Alyssa [name]) 12/06/2015  . Hyperlipidemia associated with type 2 diabetes mellitus (Alyssa [name])   . Alzheimer's dementia without behavioral disturbance 03/07/2015  . Coronary artery disease involving native coronary artery of native heart without angina pectoris 12/02/2014  . Allergic rhinitis 11/15/2014  . Essential hypertension 11/15/2014  . Arthritis, degenerative 11/15/2014  . Hypokalemia 11/15/2014  . H/O gastrointestinal hemorrhage 03/28/2011    Prior to Admission medications   Medication Sig Start Date End Date Taking? Authorizing Provider  amLODipine (NORVASC) 10 MG tablet TAKE (1) TABLET BY MOUTH EVERY DAY 12/16/16  Yes Glean Hess, MD  aspirin EC 81 MG tablet Take 81 mg by mouth daily.   Yes [provider]  Blood Glucose Monitoring Suppl (TRUETRACK BLOOD GLUCOSE) w/Device KIT 1 each by Does not apply route daily. 12/06/15  Yes Glean Hess, MD  donepezil (ARICEPT) 5 MG tablet TAKE ONE TABLET AT BEDTIME. 09/17/16  Yes Glean Hess, MD  Glucose Blood (RELION PRIME TEST VI) by In Vitro route.   Yes [provider]  glucose blood test strip 1 each by Other route as needed. Relion Prime 04/15/17  Yes Glean Hess, MD  Insulin Syringe-Needle U-100 (GLOBAL INJECT EASE INSULIN SYR) 30G X  1/2" 0.3 ML MISC USE ONCE A DAY FOR INSULIN 12/16/16  Yes Glean Hess, MD  lisinopril (PRINIVIL,ZESTRIL) 40 MG tablet Take 1 tablet (40 mg total) by mouth daily. 01/31/17  Yes Glean Hess, MD  metFORMIN (GLUCOPHAGE) 500 MG tablet TAKE (1) TABLET BY  MOUTH TWICE DAILY WITH A MEAL 03/24/17  Yes Glean Hess, MD  metoprolol succinate (TOPROL-XL) 50 MG 24 hr tablet TAKE (1) TABLET BY MOUTH EVERY DAY 07/05/17  Yes Glean Hess, MD  omeprazole (PRILOSEC) 40 MG capsule TAKE ONE CAPSULE TWICE A DAY 01/27/17  Yes Glean Hess, MD  potassium chloride SA (K-DUR,KLOR-CON) 20 MEQ tablet TAKE (1) TABLET BY MOUTH EVERY DAY 06/11/16  Yes Glean Hess, MD  insulin detemir (LEVEMIR) 100 UNIT/ML injection Inject 20 Units into the skin daily.     [provider]  oxyCODONE (ROXICODONE) 5 MG immediate release tablet Take 1 tablet (5 mg total) by mouth every 6 (six) hours as needed for moderate pain or severe pain. 04/15/17 04/15/18  Glean Hess, MD  simvastatin (ZOCOR) 40 MG tablet 1 TABLET BY MOUTH AT BEDTIME. 04/26/17   Glean Hess, MD    No Known Allergies  Past Surgical History:  Procedure Laterality Date  . ABDOMINAL HYSTERECTOMY    . CHOLECYSTECTOMY    . TONSILLECTOMY      Social History   Tobacco Use  . Smoking status: Current Every Day Smoker    Packs/day: 0.25    Years: 10.00    Pack years: 2.50    Types: Cigarettes  . Smokeless tobacco: Current User    Types: Chew  Substance Use Topics  . Alcohol use: Yes    Alcohol/week: 0.6 oz    Types: 1 Cans of beer per week  . Drug use: No     Medication list has been reviewed and updated.  PHQ 2/9 Scores 01/02/2017 12/12/2016 12/11/2016 04/11/2016  PHQ - 2 Score 0 - 0 0  PHQ- 9 Score - - - -  Exception Documentation - Other- indicate reason in comment box - -  Not completed - call completed w/ dtr - -    Physical Exam  Constitutional: She is oriented to person, place, and time. She appears well-developed. No distress.  HENT:  Head: Normocephalic and atraumatic.  Neck: Normal range of motion. Neck supple. Carotid bruit is not present. No thyromegaly present.  Cardiovascular: Normal rate and regular rhythm. Exam reveals distant heart sounds.  Murmur  heard.  Systolic murmur is present with a grade of 2/6. Pulmonary/Chest: Effort normal and breath sounds normal. No respiratory distress. She has no wheezes.  Musculoskeletal: Normal range of motion.  Neurological: She is alert and oriented to person, place, and time. She has normal strength and normal reflexes.  Skin: Skin is warm and dry. No rash noted.  Psychiatric: She has a normal mood and affect. Her speech is normal and behavior is normal.  Nursing note and vitals reviewed.   BP 118/70   Pulse 61   Ht 5' (1.524 m)   Wt 138 lb (62.6 kg)   SpO2 100%   BMI 26.95 kg/m   Assessment and Plan: 1. Cardiomyopathy due to hypertension, without heart failure ([facility]) Followed by Cardiology - no recent changes  2. Type 2 diabetes mellitus with stage 3 chronic kidney disease, with long-term current use of insulin (HCC) controlled - glucose blood (RELION PRIME TEST) test strip; Test twice a day  Dispense: 100 each; Refill: 12 -  Hemoglobin A1c  3. Essential hypertension stable - Basic metabolic panel  4. Alzheimer's dementia without behavioral disturbance, unspecified timing of dementia onset Stable sx with good family support   Meds ordered this encounter  Medications  . glucose blood (RELION PRIME TEST) test strip    Sig: Test twice a day    Dispense:  100 each    Refill:  12    Dx E11.9    Partially dictated using Editor, commissioning. Any errors are unintentional.  Halina Maidens, MD [facility]  08/18/2017

## 2017-08-20 DIAGNOSIS — G309 Alzheimer's disease, unspecified: Secondary | ICD-10-CM | POA: Diagnosis not present

## 2017-08-21 DIAGNOSIS — G309 Alzheimer's disease, unspecified: Secondary | ICD-10-CM | POA: Diagnosis not present

## 2017-08-22 DIAGNOSIS — G309 Alzheimer's disease, unspecified: Secondary | ICD-10-CM | POA: Diagnosis not present

## 2017-08-25 DIAGNOSIS — G309 Alzheimer's disease, unspecified: Secondary | ICD-10-CM | POA: Diagnosis not present

## 2017-08-26 DIAGNOSIS — G309 Alzheimer's disease, unspecified: Secondary | ICD-10-CM | POA: Diagnosis not present

## 2017-08-27 DIAGNOSIS — G309 Alzheimer's disease, unspecified: Secondary | ICD-10-CM | POA: Diagnosis not present

## 2017-08-28 DIAGNOSIS — G309 Alzheimer's disease, unspecified: Secondary | ICD-10-CM | POA: Diagnosis not present

## 2017-08-29 DIAGNOSIS — G309 Alzheimer's disease, unspecified: Secondary | ICD-10-CM | POA: Diagnosis not present

## 2017-09-01 DIAGNOSIS — G309 Alzheimer's disease, unspecified: Secondary | ICD-10-CM | POA: Diagnosis not present

## 2017-09-03 DIAGNOSIS — G309 Alzheimer's disease, unspecified: Secondary | ICD-10-CM | POA: Diagnosis not present

## 2017-09-04 DIAGNOSIS — G309 Alzheimer's disease, unspecified: Secondary | ICD-10-CM | POA: Diagnosis not present

## 2017-09-05 DIAGNOSIS — G309 Alzheimer's disease, unspecified: Secondary | ICD-10-CM | POA: Diagnosis not present

## 2017-09-08 DIAGNOSIS — G309 Alzheimer's disease, unspecified: Secondary | ICD-10-CM | POA: Diagnosis not present

## 2017-09-09 DIAGNOSIS — G309 Alzheimer's disease, unspecified: Secondary | ICD-10-CM | POA: Diagnosis not present

## 2017-09-10 ENCOUNTER — Other Ambulatory Visit: Payer: Self-pay | Admitting: Internal Medicine

## 2017-09-10 DIAGNOSIS — S42491A Other displaced fracture of lower end of right humerus, initial encounter for closed fracture: Secondary | ICD-10-CM

## 2017-09-10 DIAGNOSIS — E2839 Other primary ovarian failure: Secondary | ICD-10-CM

## 2017-09-10 DIAGNOSIS — G309 Alzheimer's disease, unspecified: Secondary | ICD-10-CM | POA: Diagnosis not present

## 2017-09-11 DIAGNOSIS — G309 Alzheimer's disease, unspecified: Secondary | ICD-10-CM | POA: Diagnosis not present

## 2017-09-12 DIAGNOSIS — G309 Alzheimer's disease, unspecified: Secondary | ICD-10-CM | POA: Diagnosis not present

## 2017-09-15 ENCOUNTER — Ambulatory Visit
Admission: RE | Admit: 2017-09-15 | Discharge: 2017-09-15 | Disposition: A | Discharge status: Home / Self Care | Payer: Medicare HMO | Source: Ambulatory Visit | Attending: Internal Medicine | Admitting: Internal Medicine

## 2017-09-15 DIAGNOSIS — S42491A Other displaced fracture of lower end of right humerus, initial encounter for closed fracture: Secondary | ICD-10-CM | POA: Diagnosis not present

## 2017-09-15 DIAGNOSIS — X58XXXA Exposure to other specified factors, initial encounter: Secondary | ICD-10-CM | POA: Insufficient documentation

## 2017-09-15 DIAGNOSIS — Z780 Asymptomatic menopausal state: Secondary | ICD-10-CM | POA: Diagnosis not present

## 2017-09-15 DIAGNOSIS — E2839 Other primary ovarian failure: Secondary | ICD-10-CM | POA: Diagnosis not present

## 2017-09-15 DIAGNOSIS — G309 Alzheimer's disease, unspecified: Secondary | ICD-10-CM | POA: Diagnosis not present

## 2017-09-15 DIAGNOSIS — M85852 Other specified disorders of bone density and structure, left thigh: Secondary | ICD-10-CM | POA: Diagnosis not present

## 2017-09-16 DIAGNOSIS — G309 Alzheimer's disease, unspecified: Secondary | ICD-10-CM | POA: Diagnosis not present

## 2017-09-17 DIAGNOSIS — G309 Alzheimer's disease, unspecified: Secondary | ICD-10-CM | POA: Diagnosis not present

## 2017-09-18 DIAGNOSIS — G309 Alzheimer's disease, unspecified: Secondary | ICD-10-CM | POA: Diagnosis not present

## 2017-09-19 DIAGNOSIS — G309 Alzheimer's disease, unspecified: Secondary | ICD-10-CM | POA: Diagnosis not present

## 2017-09-22 DIAGNOSIS — G309 Alzheimer's disease, unspecified: Secondary | ICD-10-CM | POA: Diagnosis not present

## 2017-09-23 DIAGNOSIS — G309 Alzheimer's disease, unspecified: Secondary | ICD-10-CM | POA: Diagnosis not present

## 2017-09-24 ENCOUNTER — Telehealth: Payer: Self-pay

## 2017-09-24 DIAGNOSIS — G309 Alzheimer's disease, unspecified: Secondary | ICD-10-CM | POA: Diagnosis not present

## 2017-09-24 NOTE — Telephone Encounter (Signed)
Melissa from [facility] called to keep patient compliant with Medicare rules. States she needed Patient report for bone Density Scan by March 7th. Faxed her the report for patient bone density to her confidential FAX: [phone]. Received confirmation fax.

## 2017-09-25 DIAGNOSIS — G309 Alzheimer's disease, unspecified: Secondary | ICD-10-CM | POA: Diagnosis not present

## 2017-09-26 DIAGNOSIS — G309 Alzheimer's disease, unspecified: Secondary | ICD-10-CM | POA: Diagnosis not present

## 2017-09-29 DIAGNOSIS — G309 Alzheimer's disease, unspecified: Secondary | ICD-10-CM | POA: Diagnosis not present

## 2017-09-30 DIAGNOSIS — G309 Alzheimer's disease, unspecified: Secondary | ICD-10-CM | POA: Diagnosis not present

## 2017-10-01 DIAGNOSIS — G309 Alzheimer's disease, unspecified: Secondary | ICD-10-CM | POA: Diagnosis not present

## 2017-10-02 DIAGNOSIS — G309 Alzheimer's disease, unspecified: Secondary | ICD-10-CM | POA: Diagnosis not present

## 2017-10-03 DIAGNOSIS — G309 Alzheimer's disease, unspecified: Secondary | ICD-10-CM | POA: Diagnosis not present

## 2017-10-06 DIAGNOSIS — G309 Alzheimer's disease, unspecified: Secondary | ICD-10-CM | POA: Diagnosis not present

## 2017-10-07 DIAGNOSIS — G309 Alzheimer's disease, unspecified: Secondary | ICD-10-CM | POA: Diagnosis not present

## 2017-10-08 DIAGNOSIS — G309 Alzheimer's disease, unspecified: Secondary | ICD-10-CM | POA: Diagnosis not present

## 2017-10-09 DIAGNOSIS — G309 Alzheimer's disease, unspecified: Secondary | ICD-10-CM | POA: Diagnosis not present

## 2017-10-10 DIAGNOSIS — G309 Alzheimer's disease, unspecified: Secondary | ICD-10-CM | POA: Diagnosis not present

## 2017-10-14 ENCOUNTER — Other Ambulatory Visit: Payer: Self-pay | Admitting: Internal Medicine

## 2017-10-14 DIAGNOSIS — F0390 Unspecified dementia without behavioral disturbance: Secondary | ICD-10-CM

## 2017-10-14 DIAGNOSIS — G309 Alzheimer's disease, unspecified: Secondary | ICD-10-CM | POA: Diagnosis not present

## 2017-10-15 DIAGNOSIS — G309 Alzheimer's disease, unspecified: Secondary | ICD-10-CM | POA: Diagnosis not present

## 2017-10-16 DIAGNOSIS — G309 Alzheimer's disease, unspecified: Secondary | ICD-10-CM | POA: Diagnosis not present

## 2017-10-17 DIAGNOSIS — G309 Alzheimer's disease, unspecified: Secondary | ICD-10-CM | POA: Diagnosis not present

## 2017-10-20 DIAGNOSIS — G309 Alzheimer's disease, unspecified: Secondary | ICD-10-CM | POA: Diagnosis not present

## 2017-10-21 DIAGNOSIS — G309 Alzheimer's disease, unspecified: Secondary | ICD-10-CM | POA: Diagnosis not present

## 2017-10-22 DIAGNOSIS — G309 Alzheimer's disease, unspecified: Secondary | ICD-10-CM | POA: Diagnosis not present

## 2017-10-24 DIAGNOSIS — G309 Alzheimer's disease, unspecified: Secondary | ICD-10-CM | POA: Diagnosis not present

## 2017-10-27 DIAGNOSIS — G309 Alzheimer's disease, unspecified: Secondary | ICD-10-CM | POA: Diagnosis not present

## 2017-10-28 DIAGNOSIS — G309 Alzheimer's disease, unspecified: Secondary | ICD-10-CM | POA: Diagnosis not present

## 2017-10-30 DIAGNOSIS — G309 Alzheimer's disease, unspecified: Secondary | ICD-10-CM | POA: Diagnosis not present

## 2017-10-31 DIAGNOSIS — G309 Alzheimer's disease, unspecified: Secondary | ICD-10-CM | POA: Diagnosis not present

## 2017-11-03 DIAGNOSIS — G309 Alzheimer's disease, unspecified: Secondary | ICD-10-CM | POA: Diagnosis not present

## 2017-11-04 DIAGNOSIS — G309 Alzheimer's disease, unspecified: Secondary | ICD-10-CM | POA: Diagnosis not present

## 2017-11-05 LAB — HM DIABETES EYE EXAM

## 2017-11-06 ENCOUNTER — Encounter: Payer: Self-pay | Admitting: Internal Medicine

## 2017-11-06 DIAGNOSIS — G309 Alzheimer's disease, unspecified: Secondary | ICD-10-CM | POA: Diagnosis not present

## 2017-11-07 DIAGNOSIS — G309 Alzheimer's disease, unspecified: Secondary | ICD-10-CM | POA: Diagnosis not present

## 2017-11-10 DIAGNOSIS — G309 Alzheimer's disease, unspecified: Secondary | ICD-10-CM | POA: Diagnosis not present

## 2017-11-11 DIAGNOSIS — G309 Alzheimer's disease, unspecified: Secondary | ICD-10-CM | POA: Diagnosis not present

## 2017-11-12 ENCOUNTER — Other Ambulatory Visit: Payer: Self-pay

## 2017-11-12 ENCOUNTER — Emergency Department: Payer: Medicare HMO

## 2017-11-12 ENCOUNTER — Encounter: Payer: Self-pay | Admitting: Emergency Medicine

## 2017-11-12 ENCOUNTER — Emergency Department
Admission: EM | Admit: 2017-11-12 | Discharge: 2017-11-12 | Disposition: A | Discharge status: Home / Self Care | Payer: Medicare HMO | Attending: Emergency Medicine | Admitting: Emergency Medicine

## 2017-11-12 DIAGNOSIS — N183 Chronic kidney disease, stage 3 (moderate): Secondary | ICD-10-CM | POA: Diagnosis not present

## 2017-11-12 DIAGNOSIS — R4182 Altered mental status, unspecified: Secondary | ICD-10-CM | POA: Diagnosis present

## 2017-11-12 DIAGNOSIS — F0280 Dementia in other diseases classified elsewhere without behavioral disturbance: Secondary | ICD-10-CM | POA: Insufficient documentation

## 2017-11-12 DIAGNOSIS — E162 Hypoglycemia, unspecified: Secondary | ICD-10-CM

## 2017-11-12 DIAGNOSIS — Z79899 Other long term (current) drug therapy: Secondary | ICD-10-CM | POA: Diagnosis not present

## 2017-11-12 DIAGNOSIS — G308 Other Alzheimer's disease: Secondary | ICD-10-CM | POA: Insufficient documentation

## 2017-11-12 DIAGNOSIS — Z7982 Long term (current) use of aspirin: Secondary | ICD-10-CM | POA: Insufficient documentation

## 2017-11-12 DIAGNOSIS — E1122 Type 2 diabetes mellitus with diabetic chronic kidney disease: Secondary | ICD-10-CM | POA: Insufficient documentation

## 2017-11-12 DIAGNOSIS — I509 Heart failure, unspecified: Secondary | ICD-10-CM | POA: Diagnosis not present

## 2017-11-12 DIAGNOSIS — Z794 Long term (current) use of insulin: Secondary | ICD-10-CM | POA: Diagnosis not present

## 2017-11-12 DIAGNOSIS — F17210 Nicotine dependence, cigarettes, uncomplicated: Secondary | ICD-10-CM | POA: Diagnosis not present

## 2017-11-12 DIAGNOSIS — R531 Weakness: Secondary | ICD-10-CM | POA: Diagnosis not present

## 2017-11-12 DIAGNOSIS — I2510 Atherosclerotic heart disease of native coronary artery without angina pectoris: Secondary | ICD-10-CM | POA: Diagnosis not present

## 2017-11-12 DIAGNOSIS — I130 Hypertensive heart and chronic kidney disease with heart failure and stage 1 through stage 4 chronic kidney disease, or unspecified chronic kidney disease: Secondary | ICD-10-CM | POA: Insufficient documentation

## 2017-11-12 DIAGNOSIS — E11649 Type 2 diabetes mellitus with hypoglycemia without coma: Secondary | ICD-10-CM | POA: Diagnosis not present

## 2017-11-12 DIAGNOSIS — R102 Pelvic and perineal pain: Secondary | ICD-10-CM | POA: Diagnosis not present

## 2017-11-12 HISTORY — DX: Unspecified dementia, unspecified severity, without behavioral disturbance, psychotic disturbance, mood disturbance, and anxiety: F03.90

## 2017-11-12 LAB — CBC WITH DIFFERENTIAL/PLATELET
BASOS PCT: 0 %
Basophils Absolute: 0 10*3/uL (ref 0–0.1)
Eosinophils Absolute: 0 10*3/uL (ref 0–0.7)
Eosinophils Relative: 0 %
HEMATOCRIT: 30.5 % — AB (ref 35.0–47.0)
HEMOGLOBIN: 10.2 g/dL — AB (ref 12.0–16.0)
LYMPHS ABS: 0.3 10*3/uL — AB (ref 1.0–3.6)
Lymphocytes Relative: 5 %
MCH: 29.3 pg (ref 26.0–34.0)
MCHC: 33.4 g/dL (ref 32.0–36.0)
MCV: 87.7 fL (ref 80.0–100.0)
MONOS PCT: 5 %
Monocytes Absolute: 0.3 10*3/uL (ref 0.2–0.9)
NEUTROS ABS: 4.9 10*3/uL (ref 1.4–6.5)
NEUTROS PCT: 90 %
Platelets: 202 10*3/uL (ref 150–440)
RBC: 3.47 MIL/uL — ABNORMAL LOW (ref 3.80–5.20)
RDW: 15.7 % — ABNORMAL HIGH (ref 11.5–14.5)
WBC: 5.5 10*3/uL (ref 3.6–11.0)

## 2017-11-12 LAB — URINALYSIS, COMPLETE (UACMP) WITH MICROSCOPIC
BACTERIA UA: NONE SEEN
BILIRUBIN URINE: NEGATIVE
Glucose, UA: NEGATIVE mg/dL
Ketones, ur: NEGATIVE mg/dL
Leukocytes, UA: NEGATIVE
NITRITE: NEGATIVE
PH: 5 (ref 5.0–8.0)
Protein, ur: 100 mg/dL — AB
Specific Gravity, Urine: 1.006 (ref 1.005–1.030)

## 2017-11-12 LAB — COMPREHENSIVE METABOLIC PANEL
ALBUMIN: 3.1 g/dL — AB (ref 3.5–5.0)
ALK PHOS: 71 U/L (ref 38–126)
ALT: 13 U/L — ABNORMAL LOW (ref 14–54)
ANION GAP: 7 (ref 5–15)
AST: 29 U/L (ref 15–41)
BUN: 18 mg/dL (ref 6–20)
CALCIUM: 8.7 mg/dL — AB (ref 8.9–10.3)
CO2: 24 mmol/L (ref 22–32)
Chloride: 100 mmol/L — ABNORMAL LOW (ref 101–111)
Creatinine, Ser: 1.56 mg/dL — ABNORMAL HIGH (ref 0.44–1.00)
GFR calc Af Amer: 37 mL/min — ABNORMAL LOW (ref >60)
GFR calc non Af Amer: 32 mL/min — ABNORMAL LOW (ref >60)
GLUCOSE: 199 mg/dL — AB (ref 65–99)
Potassium: 4.3 mmol/L (ref 3.5–5.1)
Sodium: 131 mmol/L — ABNORMAL LOW (ref 135–145)
TOTAL PROTEIN: 7.5 g/dL (ref 6.5–8.1)
Total Bilirubin: 0.3 mg/dL (ref 0.3–1.2)

## 2017-11-12 LAB — GLUCOSE, CAPILLARY
Glucose-Capillary: 147 mg/dL — ABNORMAL HIGH (ref 65–99)
Glucose-Capillary: 88 mg/dL (ref 65–99)

## 2017-11-12 LAB — TROPONIN I
Troponin I: 0.03 ng/mL (ref <0.03)
Troponin I: <0.03 ng/mL (ref <0.03)

## 2017-11-12 NOTE — ED Notes (Signed)
Patient is resting comfortably at this time with no signs of distress present. Equal, unlabored rise and fall of chest noted within normal rate. Alert. VS stable. Will continue to monitor.

## 2017-11-12 NOTE — ED Notes (Signed)
Daughter called to verify daily medications. Daughter unsure of what they are but states she gets them filled at [facility] store in [place]

## 2017-11-12 NOTE — ED Notes (Addendum)
Pt daughter called for 4th time for update, this time not cussing at this RN. Pt woken to speak to daughter on the telephone d/t pt daughter request. Argument between pt and her daughter via telephone

## 2017-11-12 NOTE — ED Notes (Addendum)
Pt granddaughter called in regards to need for SW consult and possible placement for pt d/t dementia and need for 24/7 monitoring.   Pt daughter called and stated no placement, pt will live with her even though she works and cannot be home with her. Daughter cursing non stop at this RN on the phone, this RN instructed caller to stop cursing and yelling at this RN in order to discuss patient or phone call ends. Caller apologized. Pt states does not want to live with daughter-pt and daughter spoke during this phone call and argument between the 2 ensued.   NO POA on file.

## 2017-11-12 NOTE — ED Notes (Signed)
Calls received from pt daughter again x2, pt other granddaughter. Pt aware

## 2017-11-12 NOTE — ED Notes (Signed)
Daughter, [name] calls back stating a change of plan. Now  She will pick up patient but not till 1600 today. MD made aware

## 2017-11-12 NOTE — ED Provider Notes (Addendum)
The patient's family has arrived to take her home.   Earleen Newport, MD 11/12/17 [id number]    Earleen Newport, MD 11/12/17 [phone]

## 2017-11-12 NOTE — ED Triage Notes (Signed)
Patient from home via [facility]. Per EMS, they were called out for hypoglycemia. Upon their arrival, CBG was 88 and patient was eating peanut butter sandwich and soda. After eating, CBG was 78 so oral glucose was given by EMS. Upon arrival to ED, patient CBG 147. Patient denies any complaints at this time. Reports she thinks she is out of blood pressure medication and that is why she hasn't taken it. Per patient she lives at home alone with family that comes by to check on her. Patient is oriented to person and place. Disoriented to time and situation.

## 2017-11-12 NOTE — ED Notes (Addendum)
Granddaughter states will come get pt and work with case worker outside of hospital. MD aware. Pt medically cleared per MD.SW consult pending d/t on family inabilty to agree on d/c plans.    This RN spoke with granddaughter and daughter of pt stating family needs to come together to discuss concerns with patient. Agreements will not be met by calling this RN and cussing when disagreements happen. ED Staff happy to assist in decision making, but needs to be as a group effort.

## 2017-11-12 NOTE — ED Notes (Signed)
Spoke with daughter Shauna Hugh who states she wants to be provided resources she can follow up tomorrow and does not want to see pt or social work now. States her daughter (pt's granddaughter will be arriving soon to pick up pt). MD aware of plan

## 2017-11-12 NOTE — Clinical Social Work Note (Signed)
CSW received consult for "CSW." CSW spoke with Wynona Dove who stated patient with dementia discharged to daughter's home and transported by granddaughter. Family to follow up with patient's existing social worker about any further patient needs. CSW signing off as no further Social Work intervention need.   Oretha Ellis, Latanya Presser, [place] Social Worker-ED [phone]

## 2017-11-12 NOTE — ED Notes (Signed)
Pt's daughter Shauna Hugh called demanding to speak with her mother and states "I don't know what the fuck yall are doing up there." Explained to daughter that we were currently in report and I would call her back and let her mother have the phone to talk to her as soon as report was over.

## 2017-11-12 NOTE — ED Provider Notes (Signed)
[facility] Emergency Department Provider Note   ____________________________________________   First MD Initiated Contact with Patient 11/12/17 0250     (approximate)  I have reviewed the triage vital signs and the nursing notes.   HISTORY  Chief Complaint Hypoglycemia  Level 5 caveat: History limited by dementia  HPI Alyssa [name] is a 77 y.o. female brought to the ED from home via EMS with a chief complaint of hypoglycemia.  Patient has a history of type 2 diabetes, CAD, hypertension, hyperlipidemia whose boyfriend called 911 because patient did not seem to be responding as usual.  EMS reports blood sugar 43 on their arrival and patient was already eating a peanut butter sandwich and drinking a soda.  Blood sugar was 78 after eating so EMS administered oral glucose prior to arrival.  Upon arrival patient's sugar is 147.  Patient voices no complaints at this time.  She thinks she is out of her blood pressure medicines.  Denies recent fever, chills, chest pain, shortness of breath, abdominal pain, nausea, vomiting, dysuria.  Denies recent travel or trauma.   Past Medical History:  Diagnosis Date  . Chronic systolic CHF (congestive heart failure) ([place])   . CKD (chronic kidney disease), stage III ([place])   . Dementia   . Essential hypertension   . Hyperlipidemia   . Hypertensive Non-ischemic Cardiomyopathy    a. 2010 Echo: EF 55%;  b. 2010 borderline MV w/ apical thinning; c. 2010 Cath: LCX 50p->Med Rx;  d. 09/2014 Echo: EF 35-40% w/ Sev LVH and glob HK;  e. 09/2014 MV: EF 30-44%, small fixed apical defect (breast attenuation), no ischemia.  . Hypokalemia   . Type II diabetes mellitus [facility])     Patient Active Problem List   Diagnosis Date Noted  . Closed fracture of right distal humerus 04/15/2017  . History of falling 02/11/2017  . Hip pain, left 01/31/2017  . Cardiomyopathy due to hypertension, without heart failure ([place]) 09/23/2016  . Cardiac murmur  06/03/2016  . Type 2 diabetes mellitus with stage 3 chronic kidney disease, with long-term current use of insulin ([place]) 12/06/2015  . Hyperlipidemia associated with type 2 diabetes mellitus ([place])   . Alzheimer's dementia without behavioral disturbance 03/07/2015  . Coronary artery disease involving native coronary artery of native heart without angina pectoris 12/02/2014  . Allergic rhinitis 11/15/2014  . Essential hypertension 11/15/2014  . Arthritis, degenerative 11/15/2014  . Hypokalemia 11/15/2014  . H/O gastrointestinal hemorrhage 03/28/2011    Past Surgical History:  Procedure Laterality Date  . ABDOMINAL HYSTERECTOMY    . CHOLECYSTECTOMY    . TONSILLECTOMY      Prior to Admission medications   Medication Sig Start Date End Date Taking? Authorizing Provider  amLODipine (NORVASC) 10 MG tablet TAKE (1) TABLET BY MOUTH EVERY DAY 12/16/16   Glean Hess, MD  aspirin EC 81 MG tablet Take 81 mg by mouth daily.    [provider]  Blood Glucose Monitoring Suppl (TRUETRACK BLOOD GLUCOSE) w/Device KIT 1 each by Does not apply route daily. 12/06/15   Glean Hess, MD  donepezil (ARICEPT) 5 MG tablet 1 TABLET BY MOUTH AT BEDTIME. 10/14/17   Glean Hess, MD  glucose blood (RELION PRIME TEST) test strip Test twice a day 08/18/17   Glean Hess, MD  insulin detemir (LEVEMIR) 100 UNIT/ML injection Inject 20 Units into the skin daily.     [provider]  Insulin Syringe-Needle U-100 (GLOBAL INJECT EASE INSULIN SYR) 30G  X 1/2" 0.3 ML MISC USE ONCE A DAY FOR INSULIN 12/16/16   Glean Hess, MD  lisinopril (PRINIVIL,ZESTRIL) 40 MG tablet Take 1 tablet (40 mg total) by mouth daily. 01/31/17   Glean Hess, MD  metFORMIN (GLUCOPHAGE) 500 MG tablet TAKE (1) TABLET BY MOUTH TWICE DAILY WITH A MEAL 03/24/17   Glean Hess, MD  metoprolol succinate (TOPROL-XL) 50 MG 24 hr tablet TAKE (1) TABLET BY MOUTH EVERY DAY 07/05/17   Glean Hess, MD  omeprazole  (PRILOSEC) 40 MG capsule TAKE ONE CAPSULE TWICE A DAY 01/27/17   Glean Hess, MD  oxyCODONE (ROXICODONE) 5 MG immediate release tablet Take 1 tablet (5 mg total) by mouth every 6 (six) hours as needed for moderate pain or severe pain. 04/15/17 04/15/18  Glean Hess, MD  potassium chloride SA (K-DUR,KLOR-CON) 20 MEQ tablet TAKE (1) TABLET BY MOUTH EVERY DAY 06/11/16   Glean Hess, MD  simvastatin (ZOCOR) 40 MG tablet 1 TABLET BY MOUTH AT BEDTIME. 04/26/17   Glean Hess, MD    Allergies Patient has no known allergies.  Family History  Problem Relation Age of Onset  . Diabetes Mother   . Hypertension Mother   . Diabetes Father   . Hypertension Father   . Breast cancer Neg Hx     Social History Social History   Tobacco Use  . Smoking status: Current Every Day Smoker    Packs/day: 0.25    Years: 10.00    Pack years: 2.50    Types: Cigarettes  . Smokeless tobacco: Current User    Types: Chew  Substance Use Topics  . Alcohol use: Yes    Alcohol/week: 0.6 oz    Types: 1 Cans of beer per week  . Drug use: No    Review of Systems  Constitutional: Positive for hypoglycemia.  No fever/chills. Eyes: No visual changes. ENT: No sore throat. Cardiovascular: Denies chest pain. Respiratory: Denies shortness of breath. Gastrointestinal: No abdominal pain.  No nausea, no vomiting.  No diarrhea.  No constipation. Genitourinary: Negative for dysuria. Musculoskeletal: Negative for back pain. Skin: Negative for rash. Neurological: Negative for headaches, focal weakness or numbness.   ____________________________________________   PHYSICAL EXAM:  VITAL SIGNS: ED Triage Vitals  Enc Vitals Group     BP 11/12/17 0234 (!) 192/71     Pulse Rate 11/12/17 0234 80     Resp 11/12/17 0234 18     Temp 11/12/17 0234 97.6 F (36.4 C)     Temp Source 11/12/17 0234 Oral     SpO2 11/12/17 0234 100 %     Weight 11/12/17 0236 141 lb (64 kg)     Height 11/12/17 0236 5' 2"  (1.575 m)     Head Circumference --      Peak Flow --      Pain Score 11/12/17 0236 0     Pain Loc --      Pain Edu? --      Excl. in [place]? --     Constitutional: Alert and oriented. Well appearing and in no acute distress. Eyes: Conjunctivae are normal. PERRL. EOMI. Head: Atraumatic. Nose: No congestion/rhinnorhea. Mouth/Throat: Mucous membranes are moist.  Oropharynx non-erythematous. Neck: No stridor.  No carotid bruits. Cardiovascular: Normal rate, regular rhythm. Grossly normal heart sounds.  Good peripheral circulation. Respiratory: Normal respiratory effort.  No retractions. Lungs CTAB. Gastrointestinal: Soft and nontender. No distention. No abdominal bruits. No CVA tenderness. Musculoskeletal: No lower extremity tenderness nor  edema.  No joint effusions. Neurologic: Alert and oriented to person and place.  Normal speech and language. No gross focal neurologic deficits are appreciated. MAEx4. Skin:  Skin is warm, dry and intact. No rash noted. Psychiatric: Mood and affect are normal. Speech and behavior are normal.  ____________________________________________   LABS (all labs ordered are listed, but only abnormal results are displayed)  Labs Reviewed  GLUCOSE, CAPILLARY - Abnormal; Notable for the following components:      Result Value   Glucose-Capillary 147 (*)    All other components within normal limits  CBC WITH DIFFERENTIAL/PLATELET - Abnormal; Notable for the following components:   RBC 3.47 (*)    Hemoglobin 10.2 (*)    HCT 30.5 (*)    RDW 15.7 (*)    Lymphs Abs 0.3 (*)    All other components within normal limits  COMPREHENSIVE METABOLIC PANEL - Abnormal; Notable for the following components:   Sodium 131 (*)    Chloride 100 (*)    Glucose, Bld 199 (*)    Creatinine, Ser 1.56 (*)    Calcium 8.7 (*)    Albumin 3.1 (*)    ALT 13 (*)    GFR calc non Af Amer 32 (*)    GFR calc Af Amer 37 (*)    All other components within normal limits  TROPONIN I -  Abnormal; Notable for the following components:   Troponin I 0.03 (*)    All other components within normal limits  URINALYSIS, COMPLETE (UACMP) WITH MICROSCOPIC - Abnormal; Notable for the following components:   Color, Urine STRAW (*)    APPearance CLEAR (*)    Hgb urine dipstick SMALL (*)    Protein, ur 100 (*)    Squamous Epithelial / LPF 0-5 (*)    All other components within normal limits  TROPONIN I   ____________________________________________  EKG  ED ECG REPORT I, SUNG,JADE J, the attending physician, personally viewed and interpreted this ECG.   Date: 11/12/2017  EKG Time: 0318  Rate: 80  Rhythm: normal EKG, normal sinus rhythm  Axis: Normal  Intervals:none  ST&T Change: Nonspecific  ____________________________________________  RADIOLOGY  ED MD interpretation: No acute cardiopulmonary process  Official radiology report(s): Dg Pelvis 1-2 Views  Result Date: 11/12/2017 CLINICAL DATA:  Acute onset of bilateral hip pain. Initial encounter. EXAM: PELVIS - 1-2 VIEW COMPARISON:  Abdominal radiograph performed 01/10/2010 FINDINGS: There is no evidence of fracture or dislocation. Both femoral heads are seated normally within their respective acetabula. No significant degenerative change is appreciated. The sacroiliac joints are unremarkable in appearance. The visualized bowel gas pattern is grossly unremarkable in appearance. Scattered phleboliths are noted within the pelvis. IMPRESSION: No evidence of fracture or dislocation. Electronically Signed   By: Garald Balding M.D.   On: 11/12/2017 04:25   Dg Chest Port 1 View  Result Date: 11/12/2017 CLINICAL DATA:  Acute onset of generalized weakness.  Hypoglycemia. EXAM: PORTABLE CHEST 1 VIEW COMPARISON:  Chest radiograph performed 06/04/2017 FINDINGS: The lungs are well-aerated and clear. There is no evidence of focal opacification, pleural effusion or pneumothorax. The cardiomediastinal silhouette is borderline enlarged. No  acute osseous abnormalities are seen. IMPRESSION: Borderline cardiomegaly.  Lungs remain grossly clear. Electronically Signed   By: Garald Balding M.D.   On: 11/12/2017 03:10    ____________________________________________   PROCEDURES  Procedure(s) performed: None  Procedures  Critical Care performed: No  ____________________________________________   INITIAL IMPRESSION / ASSESSMENT AND PLAN / ED COURSE  As part  of my medical decision making, I reviewed the following data within the [place] History obtained from family, Nursing notes reviewed and incorporated, Labs reviewed, EKG interpreted, Old chart reviewed, Radiograph reviewed and Notes from prior ED visits   77 year old female with type 2 diabetes who presents for hypoglycemia.  Differential diagnosis includes but is not limited to infection, metabolic etiologies, etc.  Family called and spoke with patient's primary nurse.  Granddaughter states patient lives at home alone; sometimes her boyfriend is there.  Feels that patient is unsafe to go back home and would like for patient to stay in the ED for social work evaluation in the morning.  Patient's daughter also called and states patient had been complaining of bilateral hip pain without injury.  Will add pelvis x-ray.  Obtain screening lab work and urinalysis.  If there is no hospitalization criteria, then will obtain social work consult.  Clinical Course as of Nov 12 709  Wed Nov 12, 2017  0635 Patient resting in speaking to family members on the telephone.  Repeat troponin is pending.  Laboratory results reveal stable anemia and renal insufficiency.   [JS]  0650 Repeat troponin unremarkable.  At this time I do not see that patient meets inpatient hospitalization criteria.  Will consult clinical social work as well as physical therapy.   [JS]  0709 Unable to verify patient's home medications.  Hope daytime pharmacy tech may be able to reconcile these.  I  hear some chatter that family may decide to take patient home rather than seek social work evaluation.  I think that is reasonable if they choose to do that and patient may be safely discharged home.  Care transferred to Dr. Jimmye Norman at change of shift pending disposition.   [JS]    Clinical Course User Index [JS] Paulette Blanch, MD     ____________________________________________   FINAL CLINICAL IMPRESSION(S) / ED DIAGNOSES  Final diagnoses:  Hypoglycemia     ED Discharge Orders    None       Note:  This document was prepared using Dragon voice recognition software and may include unintentional dictation errors.    Paulette Blanch, MD 11/12/17 [phone]

## 2017-11-13 DIAGNOSIS — G309 Alzheimer's disease, unspecified: Secondary | ICD-10-CM | POA: Diagnosis not present

## 2017-11-14 DIAGNOSIS — G309 Alzheimer's disease, unspecified: Secondary | ICD-10-CM | POA: Diagnosis not present

## 2017-11-17 DIAGNOSIS — G309 Alzheimer's disease, unspecified: Secondary | ICD-10-CM | POA: Diagnosis not present

## 2017-11-18 DIAGNOSIS — G309 Alzheimer's disease, unspecified: Secondary | ICD-10-CM | POA: Diagnosis not present

## 2017-11-19 DIAGNOSIS — G309 Alzheimer's disease, unspecified: Secondary | ICD-10-CM | POA: Diagnosis not present

## 2017-11-20 DIAGNOSIS — G309 Alzheimer's disease, unspecified: Secondary | ICD-10-CM | POA: Diagnosis not present

## 2017-11-21 DIAGNOSIS — G309 Alzheimer's disease, unspecified: Secondary | ICD-10-CM | POA: Diagnosis not present

## 2017-11-24 DIAGNOSIS — G309 Alzheimer's disease, unspecified: Secondary | ICD-10-CM | POA: Diagnosis not present

## 2017-11-25 DIAGNOSIS — G309 Alzheimer's disease, unspecified: Secondary | ICD-10-CM | POA: Diagnosis not present

## 2017-11-26 ENCOUNTER — Ambulatory Visit: Payer: Medicare HMO | Admitting: Internal Medicine

## 2017-11-27 DIAGNOSIS — G309 Alzheimer's disease, unspecified: Secondary | ICD-10-CM | POA: Diagnosis not present

## 2017-11-28 DIAGNOSIS — G309 Alzheimer's disease, unspecified: Secondary | ICD-10-CM | POA: Diagnosis not present

## 2017-12-01 DIAGNOSIS — G309 Alzheimer's disease, unspecified: Secondary | ICD-10-CM | POA: Diagnosis not present

## 2017-12-02 DIAGNOSIS — G309 Alzheimer's disease, unspecified: Secondary | ICD-10-CM | POA: Diagnosis not present

## 2017-12-04 DIAGNOSIS — G309 Alzheimer's disease, unspecified: Secondary | ICD-10-CM | POA: Diagnosis not present

## 2017-12-05 DIAGNOSIS — G309 Alzheimer's disease, unspecified: Secondary | ICD-10-CM | POA: Diagnosis not present

## 2017-12-06 ENCOUNTER — Other Ambulatory Visit: Payer: Self-pay | Admitting: Internal Medicine

## 2017-12-08 DIAGNOSIS — G309 Alzheimer's disease, unspecified: Secondary | ICD-10-CM | POA: Diagnosis not present

## 2017-12-09 DIAGNOSIS — G309 Alzheimer's disease, unspecified: Secondary | ICD-10-CM | POA: Diagnosis not present

## 2017-12-10 DIAGNOSIS — G309 Alzheimer's disease, unspecified: Secondary | ICD-10-CM | POA: Diagnosis not present

## 2017-12-11 DIAGNOSIS — G309 Alzheimer's disease, unspecified: Secondary | ICD-10-CM | POA: Diagnosis not present

## 2017-12-12 DIAGNOSIS — G309 Alzheimer's disease, unspecified: Secondary | ICD-10-CM | POA: Diagnosis not present

## 2017-12-15 DIAGNOSIS — G309 Alzheimer's disease, unspecified: Secondary | ICD-10-CM | POA: Diagnosis not present

## 2017-12-16 DIAGNOSIS — S42411D Displaced simple supracondylar fracture without intercondylar fracture of right humerus, subsequent encounter for fracture with routine healing: Secondary | ICD-10-CM | POA: Diagnosis not present

## 2017-12-17 DIAGNOSIS — G309 Alzheimer's disease, unspecified: Secondary | ICD-10-CM | POA: Diagnosis not present

## 2017-12-18 DIAGNOSIS — G309 Alzheimer's disease, unspecified: Secondary | ICD-10-CM | POA: Diagnosis not present

## 2017-12-19 DIAGNOSIS — G309 Alzheimer's disease, unspecified: Secondary | ICD-10-CM | POA: Diagnosis not present

## 2017-12-22 DIAGNOSIS — G309 Alzheimer's disease, unspecified: Secondary | ICD-10-CM | POA: Diagnosis not present

## 2017-12-25 DIAGNOSIS — G309 Alzheimer's disease, unspecified: Secondary | ICD-10-CM | POA: Diagnosis not present

## 2017-12-26 DIAGNOSIS — G309 Alzheimer's disease, unspecified: Secondary | ICD-10-CM | POA: Diagnosis not present

## 2017-12-29 DIAGNOSIS — G309 Alzheimer's disease, unspecified: Secondary | ICD-10-CM | POA: Diagnosis not present

## 2017-12-30 DIAGNOSIS — G309 Alzheimer's disease, unspecified: Secondary | ICD-10-CM | POA: Diagnosis not present

## 2017-12-31 DIAGNOSIS — G309 Alzheimer's disease, unspecified: Secondary | ICD-10-CM | POA: Diagnosis not present

## 2018-01-02 DIAGNOSIS — G309 Alzheimer's disease, unspecified: Secondary | ICD-10-CM | POA: Diagnosis not present

## 2018-01-05 DIAGNOSIS — G309 Alzheimer's disease, unspecified: Secondary | ICD-10-CM | POA: Diagnosis not present

## 2018-01-06 DIAGNOSIS — G309 Alzheimer's disease, unspecified: Secondary | ICD-10-CM | POA: Diagnosis not present

## 2018-01-07 ENCOUNTER — Telehealth: Payer: Self-pay

## 2018-01-07 DIAGNOSIS — G309 Alzheimer's disease, unspecified: Secondary | ICD-10-CM | POA: Diagnosis not present

## 2018-01-07 NOTE — Telephone Encounter (Signed)
Received a phone call from Juluis Pitch from [employer] of social service and adult protective service concerning about Mrs. Sarkis guardianship. They received a report on 10/01/2017 from the nurse assistant that Mrs. Santori has been having memory problems and forgetting to shut off gas stove in the home, and  leaving it running all day. They are concern because of she is not able to dress herself appropriately and not sure how the daughter is taking care of her. They are needing an Affidavit or a letter from Dr. Army Melia regarding guardianship and the safety of Mrs. Sahakian's condition. As of right now the NA is taking care of the patient through out the day and the daughter is taking care of the mother in the evenings. They are unsure if the daughter was taking care of the patient from the time she was seen at the clinic till now. I advise Mrs. Wynetta Emery there we will need a legal documentation pertaining to the disclosure of her health history. Advise Mrs Wynetta Emery to fax over what she is needing and so that we may process this accordingly.

## 2018-01-08 ENCOUNTER — Telehealth: Payer: Self-pay | Admitting: Internal Medicine

## 2018-01-08 NOTE — Telephone Encounter (Signed)
Re-sched same day appt

## 2018-01-09 DIAGNOSIS — G309 Alzheimer's disease, unspecified: Secondary | ICD-10-CM | POA: Diagnosis not present

## 2018-01-21 ENCOUNTER — Other Ambulatory Visit
Admission: RE | Admit: 2018-01-21 | Discharge: 2018-01-21 | Disposition: A | Discharge status: Home / Self Care | Payer: Medicare HMO | Source: Ambulatory Visit | Attending: Internal Medicine | Admitting: Internal Medicine

## 2018-01-21 ENCOUNTER — Ambulatory Visit (INDEPENDENT_AMBULATORY_CARE_PROVIDER_SITE_OTHER): Payer: Medicare HMO | Admitting: Internal Medicine

## 2018-01-21 ENCOUNTER — Encounter: Payer: Self-pay | Admitting: Internal Medicine

## 2018-01-21 VITALS — BP 148/82 | HR 68 | Temp 98.1°F | Resp 16 | Wt 138.0 lb

## 2018-01-21 DIAGNOSIS — N183 Chronic kidney disease, stage 3 (moderate): Secondary | ICD-10-CM | POA: Diagnosis not present

## 2018-01-21 DIAGNOSIS — I10 Essential (primary) hypertension: Secondary | ICD-10-CM

## 2018-01-21 DIAGNOSIS — Z794 Long term (current) use of insulin: Secondary | ICD-10-CM | POA: Diagnosis not present

## 2018-01-21 DIAGNOSIS — E1122 Type 2 diabetes mellitus with diabetic chronic kidney disease: Secondary | ICD-10-CM | POA: Diagnosis not present

## 2018-01-21 DIAGNOSIS — F0280 Dementia in other diseases classified elsewhere without behavioral disturbance: Secondary | ICD-10-CM | POA: Diagnosis not present

## 2018-01-21 DIAGNOSIS — G309 Alzheimer's disease, unspecified: Secondary | ICD-10-CM | POA: Diagnosis not present

## 2018-01-21 LAB — HEMOGLOBIN A1C
Hgb A1c MFr Bld: 5.7 % — ABNORMAL HIGH (ref 4.8–5.6)
Mean Plasma Glucose: 116.89 mg/dL

## 2018-01-21 LAB — COMPREHENSIVE METABOLIC PANEL
ALBUMIN: 3 g/dL — AB (ref 3.5–5.0)
ALK PHOS: 54 U/L (ref 38–126)
ALT: 13 U/L (ref 0–44)
AST: 26 U/L (ref 15–41)
Anion gap: 10 (ref 5–15)
BILIRUBIN TOTAL: 0.5 mg/dL (ref 0.3–1.2)
BUN: 18 mg/dL (ref 8–23)
CO2: 22 mmol/L (ref 22–32)
CREATININE: 1.72 mg/dL — AB (ref 0.44–1.00)
Calcium: 8.2 mg/dL — ABNORMAL LOW (ref 8.9–10.3)
Chloride: 104 mmol/L (ref 98–111)
GFR calc Af Amer: 33 mL/min — ABNORMAL LOW (ref >60)
GFR calc non Af Amer: 28 mL/min — ABNORMAL LOW (ref >60)
GLUCOSE: 129 mg/dL — AB (ref 70–99)
Potassium: 4 mmol/L (ref 3.5–5.1)
Sodium: 136 mmol/L (ref 135–145)
TOTAL PROTEIN: 6.7 g/dL (ref 6.5–8.1)

## 2018-01-21 LAB — TSH: TSH: 2.158 u[IU]/mL (ref 0.350–4.500)

## 2018-01-21 NOTE — Progress Notes (Signed)
Date:  01/21/2018   Name:  Alyssa [name]   DOB:  1940/08/15   MRN:  [id number]   Chief Complaint: Diabetes and Hypertension Diabetes  She presents for her follow-up diabetic visit. She has type 2 diabetes mellitus. Her disease course has been stable. Hypoglycemia symptoms include confusion. Pertinent negatives for hypoglycemia include no headaches, nervousness/anxiousness or tremors. Pertinent negatives for diabetes include no chest pain, no fatigue, no polydipsia and no polyuria. Symptoms are stable. Her weight is stable. She monitors blood glucose at home 1-2 x per week. Her breakfast blood glucose is taken between 7-8 am. Her breakfast blood glucose range is generally 110-130 mg/dl. An ACE inhibitor/angiotensin II receptor blocker is being taken.  Hypertension  This is a chronic problem. Pertinent negatives include no chest pain, headaches, palpitations or shortness of breath. Past treatments include beta blockers, ACE inhibitors and calcium channel blockers.   Daughter is planning to start meals on wheels.  Daughter is no longer working but lives 2 miles away.  Daughters boyfriend stays with some, granddaughter checks on her but doesn't stay.  Patient's boyfriend stays overnight every night. Social services is involved - worried about safety.  Guardianship issues are involved.  Pt has been seen wandering the neighborhood with no memory of her address or phone,  She has been observed turning on the gas stove and walking away.     Review of Systems  Constitutional: Negative for appetite change, fatigue, fever and unexpected weight change.  HENT: Negative for tinnitus and trouble swallowing.   Eyes: Negative for visual disturbance.  Respiratory: Negative for cough, chest tightness and shortness of breath.   Cardiovascular: Negative for chest pain, palpitations and leg swelling.  Gastrointestinal: Negative for abdominal pain.  Endocrine: Negative for polydipsia and polyuria.    Genitourinary: Negative for dysuria and hematuria.  Musculoskeletal: Negative for arthralgias.  Skin: Negative for color change and rash.  Neurological: Negative for tremors, numbness and headaches.  Psychiatric/Behavioral: Positive for confusion. Negative for dysphoric mood and sleep disturbance. The patient is not nervous/anxious.     Patient Active Problem List   Diagnosis Date Noted  . Closed fracture of right distal humerus 04/15/2017  . History of falling 02/11/2017  . Hip pain, left 01/31/2017  . Cardiomyopathy due to hypertension, without heart failure ([place]) 09/23/2016  . Cardiac murmur 06/03/2016  . Type 2 diabetes mellitus with stage 3 chronic kidney disease, with long-term current use of insulin ([place]) 12/06/2015  . Hyperlipidemia associated with type 2 diabetes mellitus ([place])   . Alzheimer's dementia without behavioral disturbance 03/07/2015  . Coronary artery disease involving native coronary artery of native heart without angina pectoris 12/02/2014  . Allergic rhinitis 11/15/2014  . Essential hypertension 11/15/2014  . Arthritis, degenerative 11/15/2014  . Hypokalemia 11/15/2014  . H/O gastrointestinal hemorrhage 03/28/2011    Prior to Admission medications   Medication Sig Start Date End Date Taking? Authorizing Provider  amLODipine (NORVASC) 10 MG tablet TAKE (1) TABLET BY MOUTH EVERY DAY 12/16/16  Yes Glean Hess, MD  aspirin EC 81 MG tablet Take 81 mg by mouth daily.   Yes [provider]  Blood Glucose Monitoring Suppl (TRUETRACK BLOOD GLUCOSE) w/Device KIT 1 each by Does not apply route daily. 12/06/15  Yes Glean Hess, MD  donepezil (ARICEPT) 5 MG tablet 1 TABLET BY MOUTH AT BEDTIME. 10/14/17  Yes Glean Hess, MD  GLOBAL INSULIN SYRINGES 30G X 5/16" 0.3 ML MISC USE ONCE A DAY FOR INSULIN 12/06/17  Yes Glean Hess, MD  glucose blood (RELION PRIME TEST) test strip Test twice a day 08/18/17  Yes Glean Hess, MD  insulin detemir  (LEVEMIR) 100 UNIT/ML injection Inject 20 Units into the skin daily.    Yes [provider]  lisinopril (PRINIVIL,ZESTRIL) 40 MG tablet Take 1 tablet (40 mg total) by mouth daily. 01/31/17  Yes Glean Hess, MD  metFORMIN (GLUCOPHAGE) 500 MG tablet TAKE (1) TABLET BY MOUTH TWICE DAILY WITH A MEAL 03/24/17  Yes Glean Hess, MD  metoprolol succinate (TOPROL-XL) 50 MG 24 hr tablet TAKE (1) TABLET BY MOUTH EVERY DAY 07/05/17  Yes Glean Hess, MD  omeprazole (PRILOSEC) 40 MG capsule TAKE ONE CAPSULE TWICE A DAY 01/27/17  Yes Glean Hess, MD  oxyCODONE (ROXICODONE) 5 MG immediate release tablet Take 1 tablet (5 mg total) by mouth every 6 (six) hours as needed for moderate pain or severe pain. 04/15/17 04/15/18 Yes Glean Hess, MD  potassium chloride SA (K-DUR,KLOR-CON) 20 MEQ tablet TAKE (1) TABLET BY MOUTH EVERY DAY 06/11/16  Yes Glean Hess, MD  simvastatin (ZOCOR) 40 MG tablet 1 TABLET BY MOUTH AT BEDTIME. 04/26/17  Yes Glean Hess, MD    No Known Allergies  Past Surgical History:  Procedure Laterality Date  . ABDOMINAL HYSTERECTOMY    . CHOLECYSTECTOMY    . TONSILLECTOMY      Social History   Tobacco Use  . Smoking status: Current Every Day Smoker    Packs/day: 0.25    Years: 10.00    Pack years: 2.50    Types: Cigarettes  . Smokeless tobacco: Current User    Types: Chew  Substance Use Topics  . Alcohol use: Yes    Alcohol/week: 0.6 oz    Types: 1 Cans of beer per week  . Drug use: No     Medication list has been reviewed and updated.  Current Meds  Medication Sig  . amLODipine (NORVASC) 10 MG tablet TAKE (1) TABLET BY MOUTH EVERY DAY  . aspirin EC 81 MG tablet Take 81 mg by mouth daily.  . Blood Glucose Monitoring Suppl (TRUETRACK BLOOD GLUCOSE) w/Device KIT 1 each by Does not apply route daily.  Marland Kitchen donepezil (ARICEPT) 5 MG tablet 1 TABLET BY MOUTH AT BEDTIME.  Marland Kitchen GLOBAL INSULIN SYRINGES 30G X 5/16" 0.3 ML MISC USE ONCE A DAY FOR  INSULIN  . glucose blood (RELION PRIME TEST) test strip Test twice a day  . insulin detemir (LEVEMIR) 100 UNIT/ML injection Inject 20 Units into the skin daily.   Marland Kitchen lisinopril (PRINIVIL,ZESTRIL) 40 MG tablet Take 1 tablet (40 mg total) by mouth daily.  . metFORMIN (GLUCOPHAGE) 500 MG tablet TAKE (1) TABLET BY MOUTH TWICE DAILY WITH A MEAL  . metoprolol succinate (TOPROL-XL) 50 MG 24 hr tablet TAKE (1) TABLET BY MOUTH EVERY DAY  . omeprazole (PRILOSEC) 40 MG capsule TAKE ONE CAPSULE TWICE A DAY  . oxyCODONE (ROXICODONE) 5 MG immediate release tablet Take 1 tablet (5 mg total) by mouth every 6 (six) hours as needed for moderate pain or severe pain.  . potassium chloride SA (K-DUR,KLOR-CON) 20 MEQ tablet TAKE (1) TABLET BY MOUTH EVERY DAY  . simvastatin (ZOCOR) 40 MG tablet 1 TABLET BY MOUTH AT BEDTIME.    PHQ 2/9 Scores 01/21/2018 01/02/2017 12/12/2016 12/11/2016  PHQ - 2 Score 0 0 - 0  PHQ- 9 Score - - - -  Exception Documentation - - Other- indicate reason in comment box -  Not completed - - call completed w/ dtr -    Physical Exam  Constitutional: She appears well-developed and well-nourished. No distress.  HENT:  Head: Normocephalic and atraumatic.  Neck: Normal range of motion. Neck supple.  Cardiovascular: Normal rate, regular rhythm and normal heart sounds.  Pulmonary/Chest: Effort normal and breath sounds normal. No respiratory distress. She has no wheezes. She has no rales.  Abdominal: Soft. Bowel sounds are normal. There is no tenderness.  Musculoskeletal: Normal range of motion.  Neurological: She is alert.  Skin: Skin is warm and dry. No rash noted.  Psychiatric: She has a normal mood and affect. Her speech is normal and behavior is normal. Cognition and memory are impaired.  Does not know the president, her address, her phone number or her daughter's phone number.  Nursing note and vitals reviewed.   BP (!) 148/82   Pulse 68   Temp 98.1 F (36.7 C) (Oral)   Resp 16   Wt  138 lb (62.6 kg)   SpO2 100%   BMI 25.24 kg/m   Assessment and Plan: 1. Type 2 diabetes mellitus with stage 3 chronic kidney disease, with long-term current use of insulin (HCC) Well controlled - Comprehensive metabolic panel - Hemoglobin A1c - TSH  2. Essential hypertension Fair control - continue current regimen for now  3. Alzheimer's dementia without behavioral disturbance, unspecified timing of dementia onset Pt needs around the clock supervision and assistance taking medications correctly Family is involved but has limited resources CAP form will be completed Records forwarded to [facility]   No orders of the defined types were placed in this encounter.   Partially dictated using Editor, commissioning. Any errors are unintentional.  Halina Maidens, MD [facility]  01/21/2018   There are no diagnoses linked to this encounter.

## 2018-01-24 ENCOUNTER — Other Ambulatory Visit: Payer: Self-pay | Admitting: Internal Medicine

## 2018-01-24 DIAGNOSIS — I10 Essential (primary) hypertension: Secondary | ICD-10-CM

## 2018-01-24 DIAGNOSIS — F0390 Unspecified dementia without behavioral disturbance: Secondary | ICD-10-CM

## 2018-02-02 ENCOUNTER — Other Ambulatory Visit: Payer: Self-pay | Admitting: Internal Medicine

## 2018-02-02 DIAGNOSIS — Z1231 Encounter for screening mammogram for malignant neoplasm of breast: Secondary | ICD-10-CM

## 2018-02-15 ENCOUNTER — Encounter: Payer: Self-pay | Admitting: Internal Medicine

## 2018-02-16 ENCOUNTER — Encounter: Payer: Self-pay | Admitting: Internal Medicine

## 2018-02-16 ENCOUNTER — Ambulatory Visit: Payer: Medicare HMO

## 2018-02-16 ENCOUNTER — Ambulatory Visit (INDEPENDENT_AMBULATORY_CARE_PROVIDER_SITE_OTHER): Payer: Medicare HMO | Admitting: Internal Medicine

## 2018-02-16 VITALS — BP 128/74 | HR 67 | Ht 62.0 in | Wt 136.0 lb

## 2018-02-16 DIAGNOSIS — Z1231 Encounter for screening mammogram for malignant neoplasm of breast: Secondary | ICD-10-CM

## 2018-02-16 DIAGNOSIS — F17200 Nicotine dependence, unspecified, uncomplicated: Secondary | ICD-10-CM | POA: Diagnosis not present

## 2018-02-16 DIAGNOSIS — L609 Nail disorder, unspecified: Secondary | ICD-10-CM

## 2018-02-16 DIAGNOSIS — F5101 Primary insomnia: Secondary | ICD-10-CM | POA: Diagnosis not present

## 2018-02-16 DIAGNOSIS — Z0000 Encounter for general adult medical examination without abnormal findings: Secondary | ICD-10-CM

## 2018-02-16 DIAGNOSIS — I10 Essential (primary) hypertension: Secondary | ICD-10-CM

## 2018-02-16 DIAGNOSIS — Z1239 Encounter for other screening for malignant neoplasm of breast: Secondary | ICD-10-CM

## 2018-02-16 DIAGNOSIS — E118 Type 2 diabetes mellitus with unspecified complications: Secondary | ICD-10-CM | POA: Diagnosis not present

## 2018-02-16 MED ORDER — METFORMIN HCL 500 MG PO TABS: ORAL_TABLET | ORAL | 5 refills | Status: DC

## 2018-02-16 MED ORDER — LISINOPRIL 40 MG PO TABS: 40.0000 mg | ORAL_TABLET | Freq: Every day | ORAL | 5 refills | Status: DC

## 2018-02-16 MED ORDER — MIRTAZAPINE 15 MG PO TABS: 15.0000 mg | ORAL_TABLET | Freq: Every day | ORAL | 5 refills | Status: AC

## 2018-02-16 MED ORDER — METOPROLOL SUCCINATE ER 50 MG PO TB24: ORAL_TABLET | ORAL | 5 refills | Status: DC

## 2018-02-16 NOTE — Patient Instructions (Signed)
Health Maintenance for Postmenopausal Women Menopause is a normal process in which your reproductive ability comes to an end. This process happens gradually over a span of months to years, usually between the ages of 22 and 9. Menopause is complete when you have missed 12 consecutive menstrual periods. It is important to talk with your health care provider about some of the most common conditions that affect postmenopausal women, such as heart disease, cancer, and bone loss (osteoporosis). Adopting a healthy lifestyle and getting preventive care can help to promote your health and wellness. Those actions can also lower your chances of developing some of these common conditions. What should I know about menopause? During menopause, you may experience a number of symptoms, such as:  Moderate-to-severe hot flashes.  Night sweats.  Decrease in sex drive.  Mood swings.  Headaches.  Tiredness.  Irritability.  Memory problems.  Insomnia.  Choosing to treat or not to treat menopausal changes is an individual decision that you make with your health care provider. What should I know about hormone replacement therapy and supplements? Hormone therapy products are effective for treating symptoms that are associated with menopause, such as hot flashes and night sweats. Hormone replacement carries certain risks, especially as you become older. If you are thinking about using estrogen or estrogen with progestin treatments, discuss the benefits and risks with your health care provider. What should I know about heart disease and stroke? Heart disease, heart attack, and stroke become more likely as you age. This may be due, in part, to the hormonal changes that your body experiences during menopause. These can affect how your body processes dietary fats, triglycerides, and cholesterol. Heart attack and stroke are both medical emergencies. There are many things that you can do to help prevent heart disease  and stroke:  Have your blood pressure checked at least every 1-2 years. High blood pressure causes heart disease and increases the risk of stroke.  If you are 53-22 years old, ask your health care provider if you should take aspirin to prevent a heart attack or a stroke.  Do not use any tobacco products, including cigarettes, chewing tobacco, or electronic cigarettes. If you need help quitting, ask your health care provider.  It is important to eat a healthy diet and maintain a healthy weight. ? Be sure to include plenty of vegetables, fruits, low-fat dairy products, and lean protein. ? Avoid eating foods that are high in solid fats, added sugars, or salt (sodium).  Get regular exercise. This is one of the most important things that you can do for your health. ? Try to exercise for at least 150 minutes each week. The type of exercise that you do should increase your heart rate and make you sweat. This is known as moderate-intensity exercise. ? Try to do strengthening exercises at least twice each week. Do these in addition to the moderate-intensity exercise.  Know your numbers.Ask your health care provider to check your cholesterol and your blood glucose. Continue to have your blood tested as directed by your health care provider.  What should I know about cancer screening? There are several types of cancer. Take the following steps to reduce your risk and to catch any cancer development as early as possible. Breast Cancer  Practice breast self-awareness. ? This means understanding how your breasts normally appear and feel. ? It also means doing regular breast self-exams. Let your health care provider know about any changes, no matter how small.  If you are 40  or older, have a clinician do a breast exam (clinical breast exam or CBE) every year. Depending on your age, family history, and medical history, it may be recommended that you also have a yearly breast X-ray (mammogram).  If you  have a family history of breast cancer, talk with your health care provider about genetic screening.  If you are at high risk for breast cancer, talk with your health care provider about having an MRI and a mammogram every year.  Breast cancer (BRCA) gene test is recommended for women who have family members with BRCA-related cancers. Results of the assessment will determine the need for genetic counseling and BRCA1 and for BRCA2 testing. BRCA-related cancers include these types: ? Breast. This occurs in males or females. ? Ovarian. ? Tubal. This may also be called fallopian tube cancer. ? Cancer of the abdominal or pelvic lining (peritoneal cancer). ? Prostate. ? Pancreatic.  Cervical, Uterine, and Ovarian Cancer Your health care provider may recommend that you be screened regularly for cancer of the pelvic organs. These include your ovaries, uterus, and vagina. This screening involves a pelvic exam, which includes checking for microscopic changes to the surface of your cervix (Pap test).  For women ages 21-65, health care providers may recommend a pelvic exam and a Pap test every three years. For women ages 79-65, they may recommend the Pap test and pelvic exam, combined with testing for human papilloma virus (HPV), every five years. Some types of HPV increase your risk of cervical cancer. Testing for HPV may also be done on women of any age who have unclear Pap test results.  Other health care providers may not recommend any screening for nonpregnant women who are considered low risk for pelvic cancer and have no symptoms. Ask your health care provider if a screening pelvic exam is right for you.  If you have had past treatment for cervical cancer or a condition that could lead to cancer, you need Pap tests and screening for cancer for at least 20 years after your treatment. If Pap tests have been discontinued for you, your risk factors (such as having a new sexual partner) need to be  reassessed to determine if you should start having screenings again. Some women have medical problems that increase the chance of getting cervical cancer. In these cases, your health care provider may recommend that you have screening and Pap tests more often.  If you have a family history of uterine cancer or ovarian cancer, talk with your health care provider about genetic screening.  If you have vaginal bleeding after reaching menopause, tell your health care provider.  There are currently no reliable tests available to screen for ovarian cancer.  Lung Cancer Lung cancer screening is recommended for adults 69-62 years old who are at high risk for lung cancer because of a history of smoking. A yearly low-dose CT scan of the lungs is recommended if you:  Currently smoke.  Have a history of at least 30 pack-years of smoking and you currently smoke or have quit within the past 15 years. A pack-year is smoking an average of one pack of cigarettes per day for one year.  Yearly screening should:  Continue until it has been 15 years since you quit.  Stop if you develop a health problem that would prevent you from having lung cancer treatment.  Colorectal Cancer  This type of cancer can be detected and can often be prevented.  Routine colorectal cancer screening usually begins at  age 42 and continues through age 45.  If you have risk factors for colon cancer, your health care provider may recommend that you be screened at an earlier age.  If you have a family history of colorectal cancer, talk with your health care provider about genetic screening.  Your health care provider may also recommend using home test kits to check for hidden blood in your stool.  A small camera at the end of a tube can be used to examine your colon directly (sigmoidoscopy or colonoscopy). This is done to check for the earliest forms of colorectal cancer.  Direct examination of the colon should be repeated every  5-10 years until age 71. However, if early forms of precancerous polyps or small growths are found or if you have a family history or genetic risk for colorectal cancer, you may need to be screened more often.  Skin Cancer  Check your skin from head to toe regularly.  Monitor any moles. Be sure to tell your health care provider: ? About any new moles or changes in moles, especially if there is a change in a mole's shape or color. ? If you have a mole that is larger than the size of a pencil eraser.  If any of your family members has a history of skin cancer, especially at a young age, talk with your health care provider about genetic screening.  Always use sunscreen. Apply sunscreen liberally and repeatedly throughout the day.  Whenever you are outside, protect yourself by wearing long sleeves, pants, a wide-brimmed hat, and sunglasses.  What should I know about osteoporosis? Osteoporosis is a condition in which bone destruction happens more quickly than new bone creation. After menopause, you may be at an increased risk for osteoporosis. To help prevent osteoporosis or the bone fractures that can happen because of osteoporosis, the following is recommended:  If you are 46-71 years old, get at least 1,000 mg of calcium and at least 600 mg of vitamin D per day.  If you are older than age 55 but younger than age 65, get at least 1,200 mg of calcium and at least 600 mg of vitamin D per day.  If you are older than age 54, get at least 1,200 mg of calcium and at least 800 mg of vitamin D per day.  Smoking and excessive alcohol intake increase the risk of osteoporosis. Eat foods that are rich in calcium and vitamin D, and do weight-bearing exercises several times each week as directed by your health care provider. What should I know about how menopause affects my mental health? Depression may occur at any age, but it is more common as you become older. Common symptoms of depression  include:  Low or sad mood.  Changes in sleep patterns.  Changes in appetite or eating patterns.  Feeling an overall lack of motivation or enjoyment of activities that you previously enjoyed.  Frequent crying spells.  Talk with your health care provider if you think that you are experiencing depression. What should I know about immunizations? It is important that you get and maintain your immunizations. These include:  Tetanus, diphtheria, and pertussis (Tdap) booster vaccine.  Influenza every year before the flu season begins.  Pneumonia vaccine.  Shingles vaccine.  Your health care provider may also recommend other immunizations. This information is not intended to replace advice given to you by your health care provider. Make sure you discuss any questions you have with your health care provider. Document Released: 09/06/2005  Document Revised: 02/02/2016 Document Reviewed: 04/18/2015 Elsevier Interactive Patient Education  2018 [employer] Inc[employer]  

## 2018-02-16 NOTE — Progress Notes (Signed)
Date:  02/16/2018   Name:  Alyssa [name]   DOB:  12-Feb-1941   MRN:  [id number]   Chief Complaint: Annual Exam (Breast Exam. ) Alyssa [name] is a 77 y.o. female who presents today for her Complete Annual Exam. She feels well. She reports exercising walking the apartment. She reports she is sleeping fairly well most nights but others she is up and down.  She denies breast lumps.  Her mammogram is scheduled for tomorrow.  Her daughter Alyssa [name] is with her today.  She is scheduled for a guardianship hearing in the near future.  SW remains involved and they are still trying to get some in home care assistance. Alyssa [name] has no complaints.  She continues smoke "only a few cigarettes" per day.  She denies any complaints.  Her daughter notes that her appetite is not good. She is taking some supplements like Ensure. Alyssa [name] denies being depressed but her daughter thinks that her mood is down.    Review of Systems  Constitutional: Positive for appetite change. Negative for diaphoresis, fatigue and fever.  HENT: Negative for hearing loss and trouble swallowing.   Eyes: Negative for visual disturbance.  Respiratory: Negative for chest tightness, shortness of breath and wheezing.   Cardiovascular: Negative for chest pain, palpitations and leg swelling.  Gastrointestinal: Negative for abdominal pain, blood in stool, constipation and diarrhea.  Genitourinary: Negative for dysuria and hematuria.  Musculoskeletal: Positive for arthralgias (right shoulder from recent healed fracture). Negative for gait problem and joint swelling.  Skin: Negative for color change and rash.  Allergic/Immunologic: Negative for environmental allergies.  Neurological: Negative for dizziness and headaches.  Hematological: Negative for adenopathy.  Psychiatric/Behavioral: Positive for confusion. Negative for dysphoric mood and sleep disturbance. The patient is not nervous/anxious.     Patient Active Problem List   Diagnosis Date Noted  . Closed fracture of right distal humerus 04/15/2017  . History of falling 02/11/2017  . Hip pain, left 01/31/2017  . Cardiomyopathy due to hypertension, without heart failure ([place]) 09/23/2016  . Cardiac murmur 06/03/2016  . Type 2 diabetes mellitus with stage 3 chronic kidney disease, with long-term current use of insulin ([place]) 12/06/2015  . Hyperlipidemia associated with type 2 diabetes mellitus ([place])   . Alzheimer's dementia without behavioral disturbance 03/07/2015  . Coronary artery disease involving native coronary artery of native heart without angina pectoris 12/02/2014  . Allergic rhinitis 11/15/2014  . Essential hypertension 11/15/2014  . Arthritis, degenerative 11/15/2014  . Hypokalemia 11/15/2014  . H/O gastrointestinal hemorrhage 03/28/2011    Prior to Admission medications   Medication Sig Start Date End Date Taking? Authorizing Provider  amLODipine (NORVASC) 10 MG tablet TAKE (1) TABLET BY MOUTH EVERY DAY 01/24/18  Yes Glean Hess, MD  aspirin EC 81 MG tablet Take 81 mg by mouth daily.   Yes [provider]  Blood Glucose Monitoring Suppl (TRUETRACK BLOOD GLUCOSE) w/Device KIT 1 each by Does not apply route daily. 12/06/15  Yes Glean Hess, MD  donepezil (ARICEPT) 5 MG tablet 1 TABLET BY MOUTH AT BEDTIME. 01/24/18  Yes Glean Hess, MD  GLOBAL INSULIN SYRINGES 30G X 5/16" 0.3 ML MISC USE ONCE A DAY FOR INSULIN 12/06/17  Yes Glean Hess, MD  glucose blood (RELION PRIME TEST) test strip Test twice a day 08/18/17  Yes Glean Hess, MD  insulin detemir (LEVEMIR) 100 UNIT/ML injection Inject 20 Units into the skin daily.    Yes [provider]  lisinopril (PRINIVIL,ZESTRIL) 40 MG tablet Take 1 tablet (40 mg total) by mouth daily. 01/31/17  Yes Glean Hess, MD  metFORMIN (GLUCOPHAGE) 500 MG tablet TAKE (1) TABLET BY MOUTH TWICE DAILY WITH A MEAL 03/24/17  Yes Glean Hess, MD  metoprolol succinate (TOPROL-XL)  50 MG 24 hr tablet TAKE (1) TABLET BY MOUTH EVERY DAY 07/05/17  Yes Glean Hess, MD  omeprazole (PRILOSEC) 40 MG capsule TAKE ONE CAPSULE TWICE A DAY 01/24/18  Yes Glean Hess, MD  oxyCODONE (ROXICODONE) 5 MG immediate release tablet Take 1 tablet (5 mg total) by mouth every 6 (six) hours as needed for moderate pain or severe pain. 04/15/17 04/15/18 Yes Glean Hess, MD  potassium chloride SA (K-DUR,KLOR-CON) 20 MEQ tablet TAKE (1) TABLET BY MOUTH EVERY DAY 06/11/16  Yes Glean Hess, MD  simvastatin (ZOCOR) 40 MG tablet 1 TABLET BY MOUTH AT BEDTIME. 04/26/17  Yes Glean Hess, MD    No Known Allergies  Past Surgical History:  Procedure Laterality Date  . ABDOMINAL HYSTERECTOMY    . CHOLECYSTECTOMY    . TONSILLECTOMY      Social History   Tobacco Use  . Smoking status: Current Every Day Smoker    Packs/day: 0.25    Years: 10.00    Pack years: 2.50    Types: Cigarettes  . Smokeless tobacco: Current User    Types: Chew  Substance Use Topics  . Alcohol use: Yes    Alcohol/week: 0.6 oz    Types: 1 Cans of beer per week  . Drug use: No     Medication list has been reviewed and updated.  Current Meds  Medication Sig  . amLODipine (NORVASC) 10 MG tablet TAKE (1) TABLET BY MOUTH EVERY DAY  . aspirin EC 81 MG tablet Take 81 mg by mouth daily.  . Blood Glucose Monitoring Suppl (TRUETRACK BLOOD GLUCOSE) w/Device KIT 1 each by Does not apply route daily.  Marland Kitchen donepezil (ARICEPT) 5 MG tablet 1 TABLET BY MOUTH AT BEDTIME.  Marland Kitchen GLOBAL INSULIN SYRINGES 30G X 5/16" 0.3 ML MISC USE ONCE A DAY FOR INSULIN  . glucose blood (RELION PRIME TEST) test strip Test twice a day  . insulin detemir (LEVEMIR) 100 UNIT/ML injection Inject 20 Units into the skin daily.   Marland Kitchen lisinopril (PRINIVIL,ZESTRIL) 40 MG tablet Take 1 tablet (40 mg total) by mouth daily.  . metFORMIN (GLUCOPHAGE) 500 MG tablet TAKE (1) TABLET BY MOUTH TWICE DAILY WITH A MEAL  . metoprolol succinate (TOPROL-XL) 50  MG 24 hr tablet TAKE (1) TABLET BY MOUTH EVERY DAY  . omeprazole (PRILOSEC) 40 MG capsule TAKE ONE CAPSULE TWICE A DAY  . oxyCODONE (ROXICODONE) 5 MG immediate release tablet Take 1 tablet (5 mg total) by mouth every 6 (six) hours as needed for moderate pain or severe pain.  . potassium chloride SA (K-DUR,KLOR-CON) 20 MEQ tablet TAKE (1) TABLET BY MOUTH EVERY DAY  . simvastatin (ZOCOR) 40 MG tablet 1 TABLET BY MOUTH AT BEDTIME.    PHQ 2/9 Scores 02/16/2018 01/21/2018 01/02/2017 12/12/2016  PHQ - 2 Score 0 0 0 -  PHQ- 9 Score - - - -  Exception Documentation - - - Other- indicate reason in comment box  Not completed - - - call completed w/ dtr    Physical Exam  Constitutional: She is oriented to person, place, and time. She appears well-developed. No distress.  HENT:  Head: Normocephalic and atraumatic.  Neck: Normal range of motion. Neck supple.  Cardiovascular: Normal rate, regular rhythm and normal heart sounds.  Pulmonary/Chest: Effort normal and breath sounds normal. No respiratory distress. She has no wheezes. She has no rales.  Abdominal: Soft. Bowel sounds are normal. She exhibits no distension and no mass. There is no tenderness. There is no guarding.  Musculoskeletal: She exhibits no edema.       Right shoulder: She exhibits decreased range of motion.       Right knee: Normal.       Left knee: Normal.  Neurological: She is alert and oriented to person, place, and time.  Skin: Skin is warm and dry. No rash noted.  Skin dry on lower legs  Psychiatric: She has a normal mood and affect. Her speech is normal and behavior is normal. Thought content normal. Cognition and memory are impaired.  Nursing note and vitals reviewed.   BP 128/74   Pulse 67   Ht _0  (1.575 m)   Wt 136 lb (61.7 kg)   SpO2 99%   BMI 24.87 kg/m   Assessment and Plan: 1. Annual physical exam Continue working on healthy diet SW involved with social issues, guardianship, etc Forms for in home care  assistance faxed To schedule MAW  2. Breast cancer screening To be done tomorrow  3. Essential hypertension controlled - lisinopril (PRINIVIL,ZESTRIL) 40 MG tablet; Take 1 tablet (40 mg total) by mouth daily.  Dispense: 30 tablet; Refill: 5 - metoprolol succinate (TOPROL-XL) 50 MG 24 hr tablet; TAKE (1) TABLET BY MOUTH EVERY DAY  Dispense: 30 tablet; Refill: 5  4. DM type 2, controlled, with complication (HCC) Continue medications - metFORMIN (GLUCOPHAGE) 500 MG tablet; TAKE (1) TABLET BY MOUTH TWICE DAILY WITH A MEAL  Dispense: 60 tablet; Refill: 5  5. Primary insomnia Will add remeron which may help with appetite as well - mirtazapine (REMERON) 15 MG tablet; Take 1 tablet (15 mg total) by mouth at bedtime.  Dispense: 30 tablet; Refill: 5  6. Nail abnormalities Refer to podiatry - Ambulatory referral to Podiatry  7. Tobacco use disorder Pt encouraged to quit smoking completely CXR in April showed no obvious pulmonary abnormality   Meds ordered this encounter  Medications  . lisinopril (PRINIVIL,ZESTRIL) 40 MG tablet    Sig: Take 1 tablet (40 mg total) by mouth daily.    Dispense:  30 tablet    Refill:  5  . metFORMIN (GLUCOPHAGE) 500 MG tablet    Sig: TAKE (1) TABLET BY MOUTH TWICE DAILY WITH A MEAL    Dispense:  60 tablet    Refill:  5  . metoprolol succinate (TOPROL-XL) 50 MG 24 hr tablet    Sig: TAKE (1) TABLET BY MOUTH EVERY DAY    Dispense:  30 tablet    Refill:  5  . mirtazapine (REMERON) 15 MG tablet    Sig: Take 1 tablet (15 mg total) by mouth at bedtime.    Dispense:  30 tablet    Refill:  5    Partially dictated using Editor, commissioning. Any errors are unintentional.  Halina Maidens, MD [facility]  02/16/2018

## 2018-02-17 ENCOUNTER — Ambulatory Visit
Admission: RE | Admit: 2018-02-17 | Discharge: 2018-02-17 | Disposition: A | Discharge status: Home / Self Care | Payer: Medicare HMO | Source: Ambulatory Visit | Attending: Internal Medicine | Admitting: Internal Medicine

## 2018-02-17 DIAGNOSIS — Z1231 Encounter for screening mammogram for malignant neoplasm of breast: Secondary | ICD-10-CM | POA: Insufficient documentation

## 2018-03-05 ENCOUNTER — Other Ambulatory Visit: Payer: Self-pay | Admitting: Internal Medicine

## 2018-03-05 DIAGNOSIS — I10 Essential (primary) hypertension: Secondary | ICD-10-CM

## 2018-03-17 ENCOUNTER — Other Ambulatory Visit: Payer: Self-pay | Admitting: Internal Medicine

## 2018-04-04 ENCOUNTER — Other Ambulatory Visit: Payer: Self-pay | Admitting: Internal Medicine

## 2018-04-23 ENCOUNTER — Other Ambulatory Visit: Payer: Self-pay | Admitting: Internal Medicine

## 2018-04-30 IMAGING — CR DG HIP (WITH OR WITHOUT PELVIS) 2-3V*L*
3 series · 4 of 4 positions shown · non-contrast
Comparison: None.

CLINICAL DATA: Left hip pain.

EXAM:
DG HIP (WITH OR WITHOUT PELVIS) 2-3V LEFT

[Series 1: pelvis ap · 0.14mm/px · 2 of 2 slices shown]
[im 1/2]
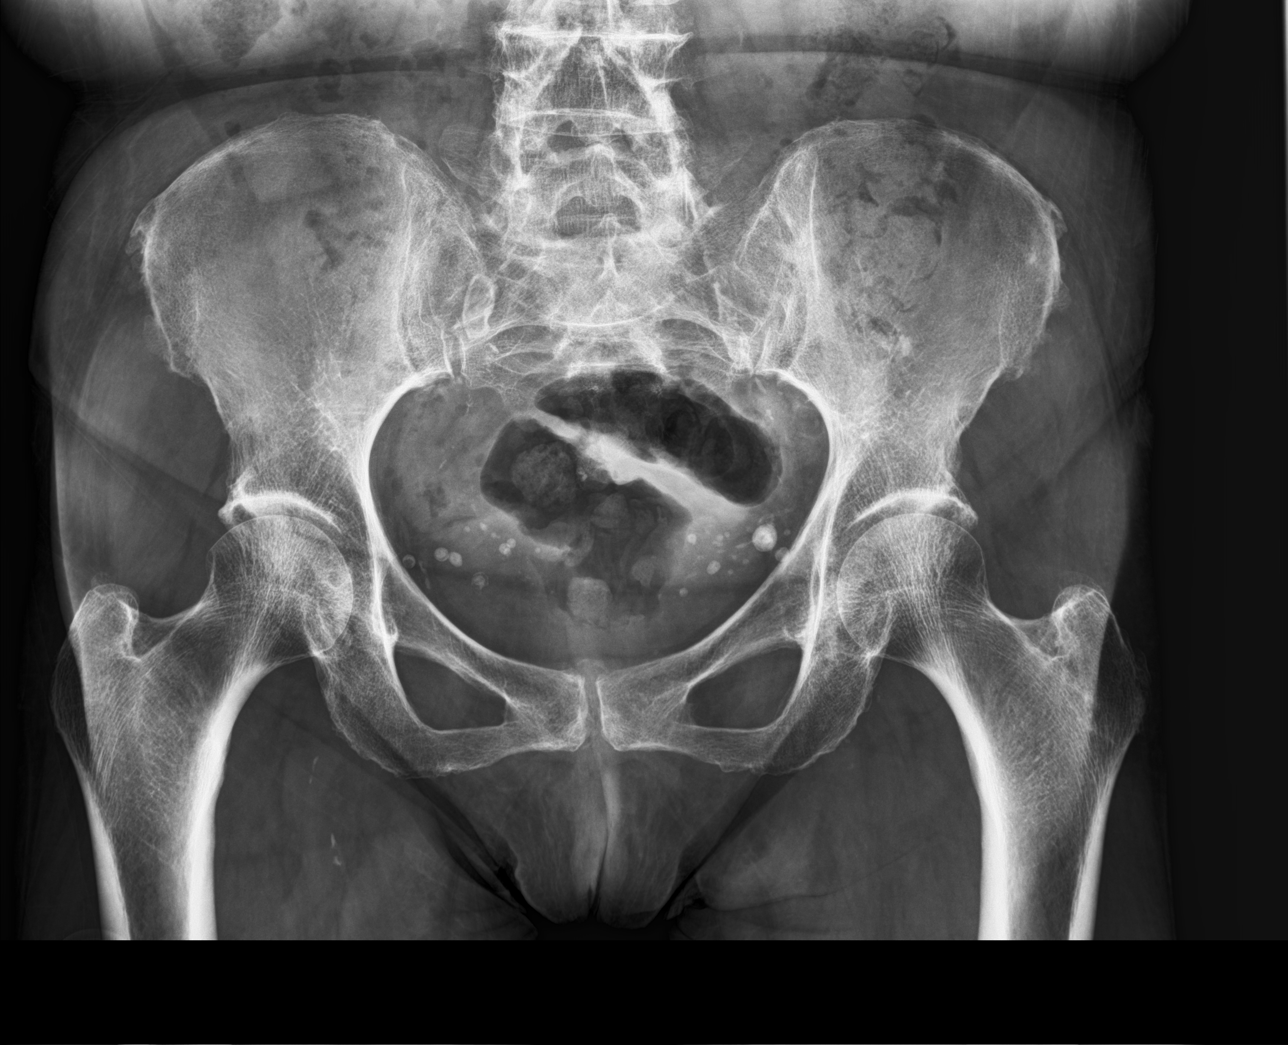
[im 2/2]
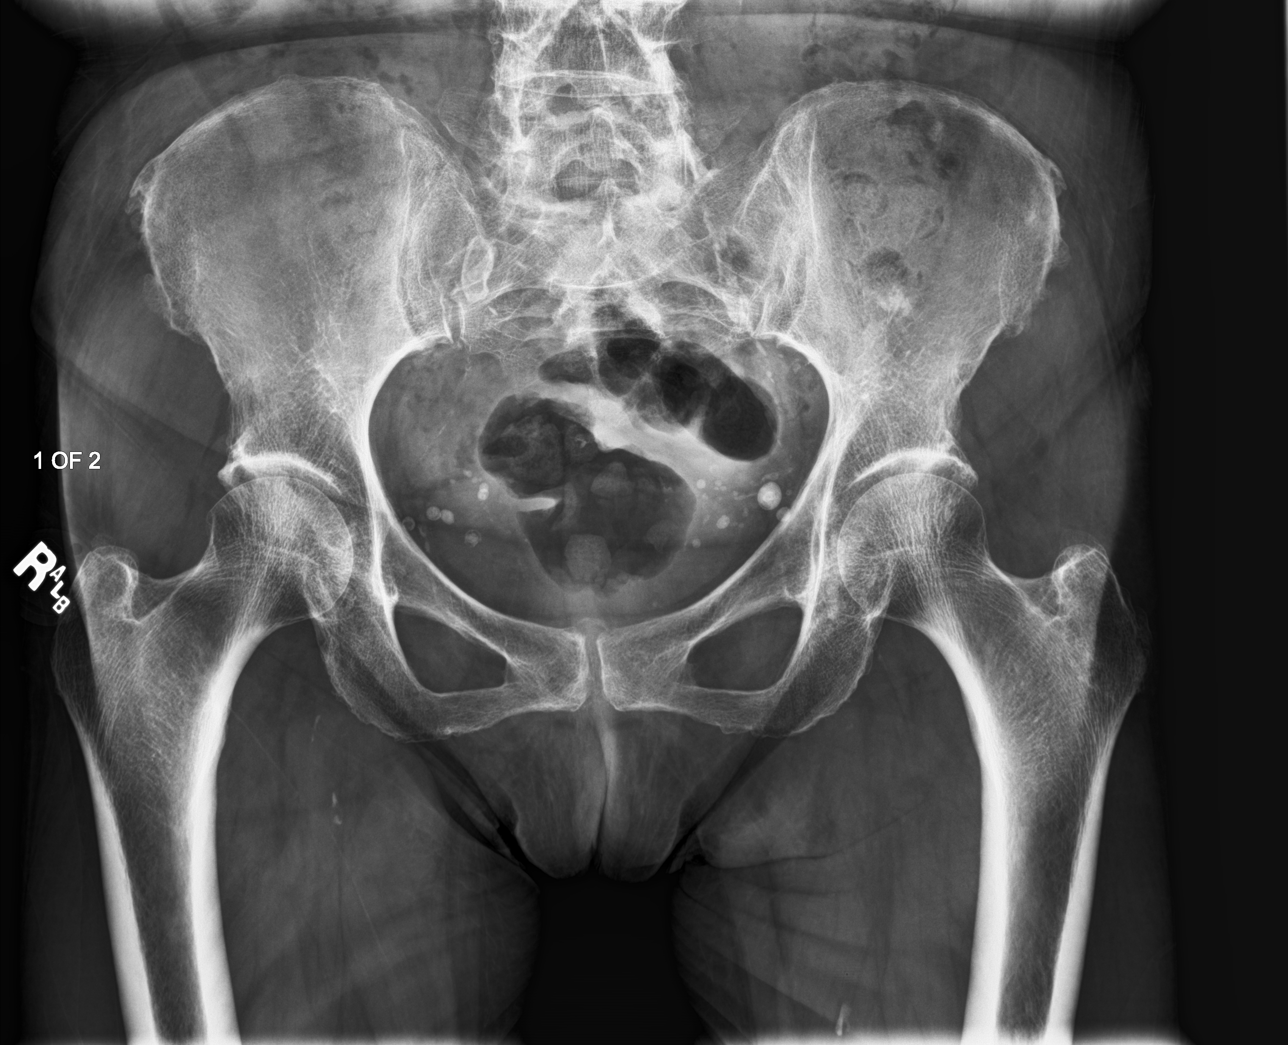

[hip ap]
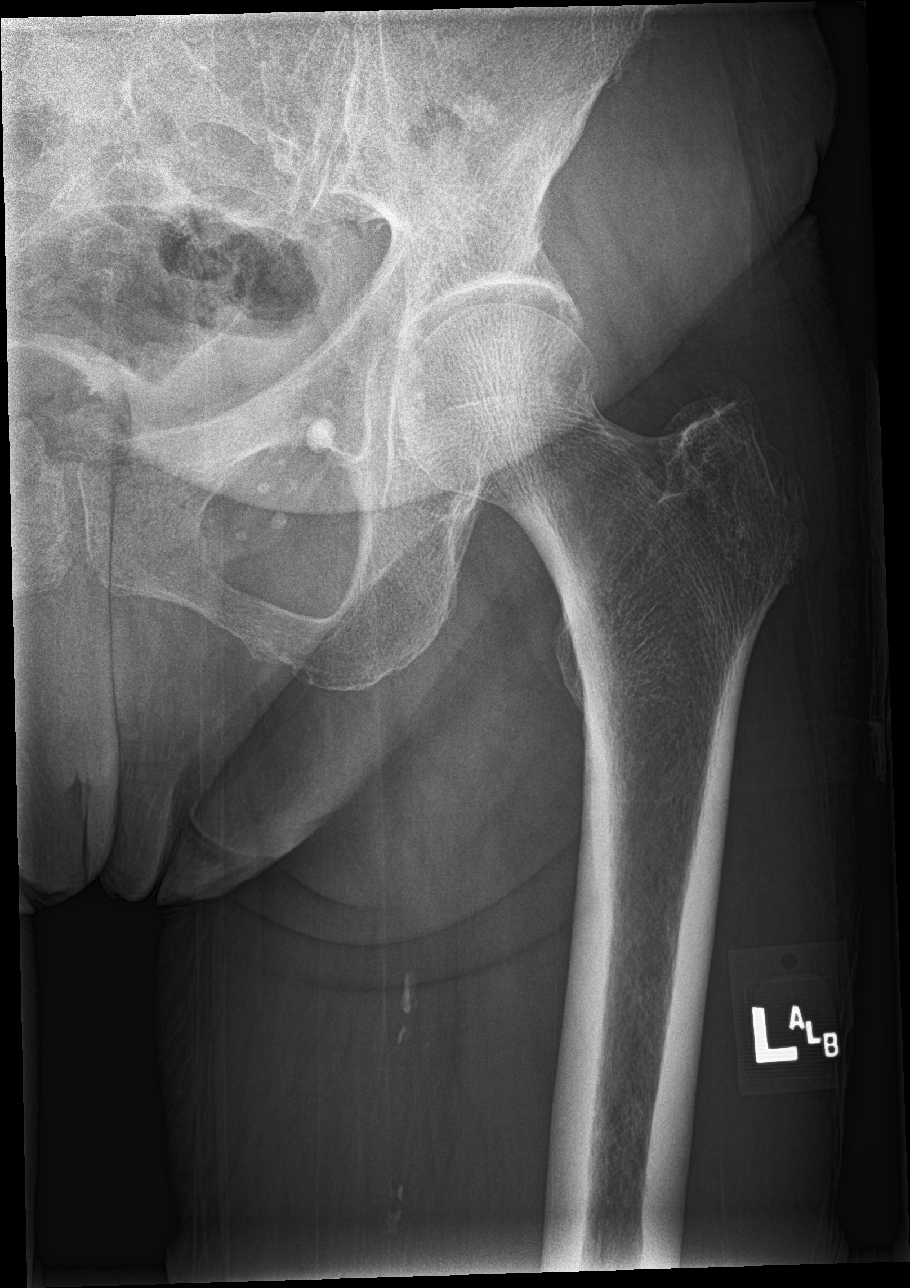

[hip lat]
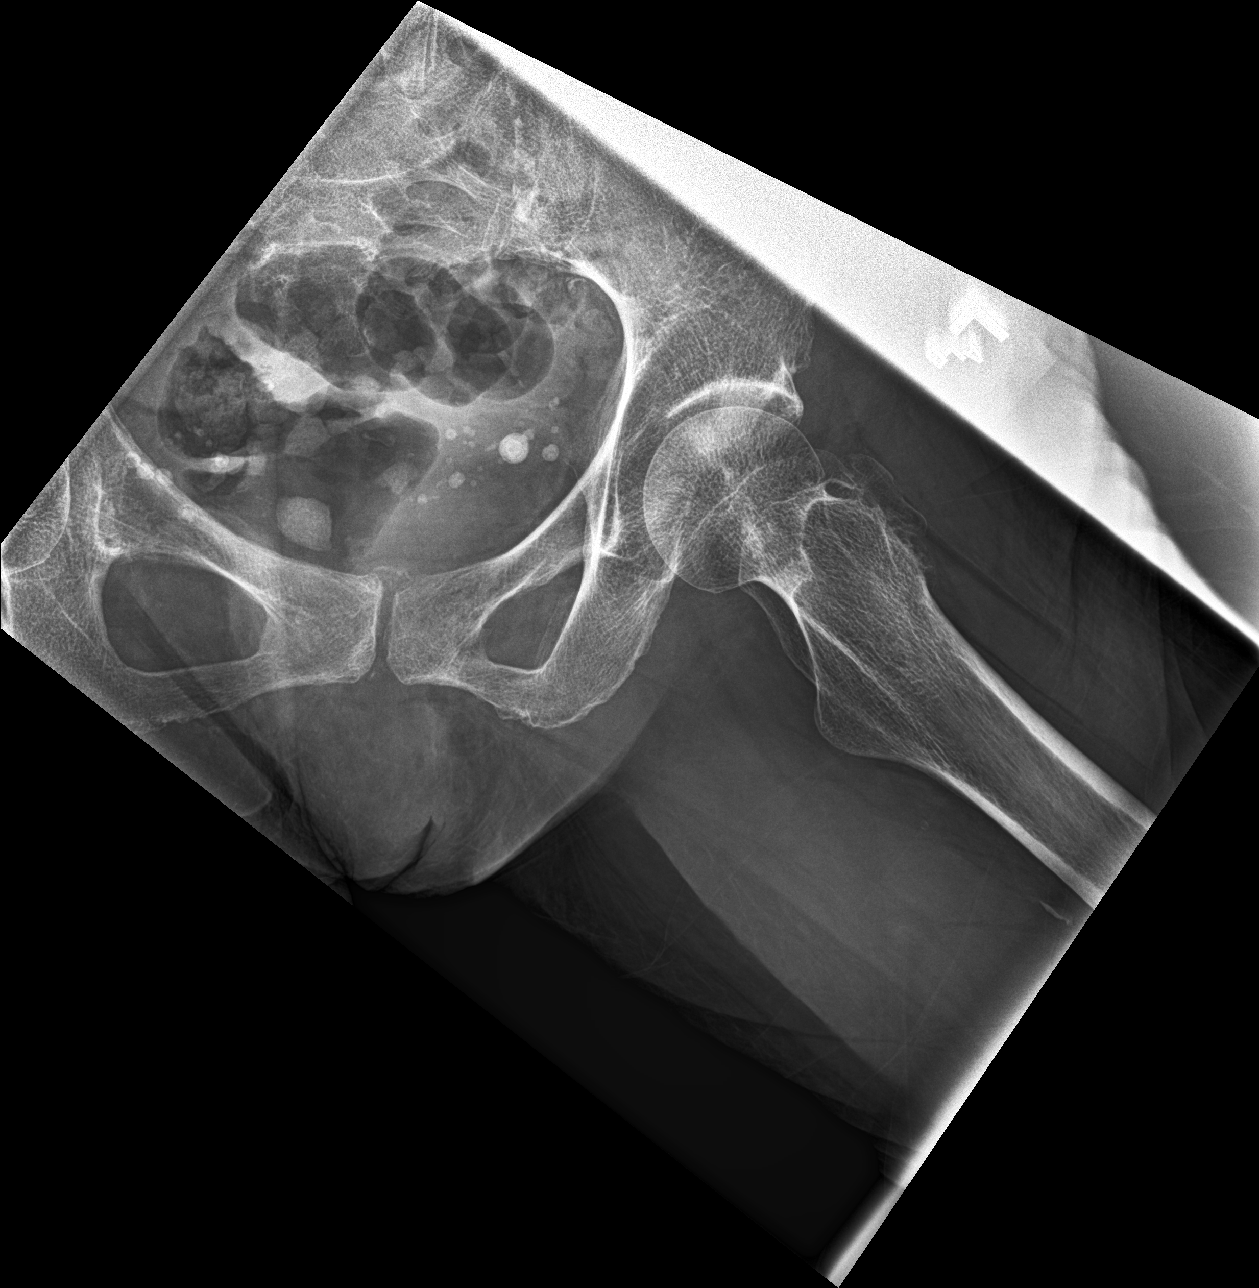

[4 of 4 positions shown; findings below may reference images not displayed]

FINDINGS: There is no evidence of hip fracture or dislocation. 10 mm sclerotic
focus versus hyperdense bowel contents overlying the left ilium.
Mild to moderate osteoarthritic changes of bilateral hips with joint
space narrowing, subchondral sclerosis and mild osteophytosis.

Osteoarthritic changes of the lumbosacral spine.
IMPRESSION: Mild-to-moderate osteoarthritic changes of bilateral hips, and lower
lumbosacral spine.

10 mm sclerotic focus overlying the left ilium. Repeated radiograph
is recommended to establish whether this represents a bone lesion or
overlying hyperdense bowel contents.

## 2018-05-01 ENCOUNTER — Other Ambulatory Visit: Payer: Self-pay | Admitting: Internal Medicine

## 2018-05-04 ENCOUNTER — Other Ambulatory Visit: Payer: Self-pay | Admitting: Internal Medicine

## 2018-05-04 ENCOUNTER — Telehealth: Payer: Self-pay

## 2018-05-04 MED ORDER — INSULIN DETEMIR 100 UNIT/ML ~~LOC~~ SOLN: 20.0000 [IU] | Freq: Every day | SUBCUTANEOUS | 1 refills | Status: DC

## 2018-05-04 MED ORDER — MELATONIN 5 MG PO CAPS: 1.0000 | ORAL_CAPSULE | Freq: Every day | ORAL | 0 refills | Status: DC

## 2018-05-04 NOTE — Telephone Encounter (Signed)
Elvis Coil from [facility] is calling about patient. She was placed in their facility on Friday. Wanted to call and inform of patient's BS for the past three days and ask for advise on what to do about them dropping. They are fasting readings first thing in the morning.  10/5: 98 10/6: 62 10/7(this morning): 59  Please Advise.   CB# [phone]

## 2018-05-04 NOTE — Telephone Encounter (Signed)
Reduce her insulin dose to 20 units per day.  Continue to monitor blood sugar as directed.

## 2018-05-04 NOTE — Telephone Encounter (Signed)
Spoke with queen and informed. She said she does need this sent over in fax. Also, patient has not slept in 3 days. Wants to know if we could prescribe melatonin for patient to help get sleep. Please Advise.  [phone]

## 2018-05-20 ENCOUNTER — Telehealth: Payer: Self-pay

## 2018-05-20 NOTE — Telephone Encounter (Signed)
Alexis from [facility] is calling stating patient has been placed in the care of there office. They are now legal guardians of the patients. Will fax over legal guardianship form over so they can come with the patient to her appts along with getting medical information on her. Patient is currently living at [facility].   FYI

## 2018-05-25 ENCOUNTER — Encounter: Payer: Self-pay | Admitting: Internal Medicine

## 2018-05-25 ENCOUNTER — Ambulatory Visit (INDEPENDENT_AMBULATORY_CARE_PROVIDER_SITE_OTHER): Payer: Medicare Other | Admitting: Internal Medicine

## 2018-05-25 VITALS — BP 141/82 | HR 62 | Resp 16 | Ht 62.0 in | Wt 134.0 lb

## 2018-05-25 DIAGNOSIS — G309 Alzheimer's disease, unspecified: Secondary | ICD-10-CM

## 2018-05-25 DIAGNOSIS — E1169 Type 2 diabetes mellitus with other specified complication: Secondary | ICD-10-CM

## 2018-05-25 DIAGNOSIS — F0280 Dementia in other diseases classified elsewhere without behavioral disturbance: Secondary | ICD-10-CM

## 2018-05-25 DIAGNOSIS — N183 Chronic kidney disease, stage 3 unspecified: Secondary | ICD-10-CM

## 2018-05-25 DIAGNOSIS — Z23 Encounter for immunization: Secondary | ICD-10-CM | POA: Diagnosis not present

## 2018-05-25 DIAGNOSIS — I10 Essential (primary) hypertension: Secondary | ICD-10-CM

## 2018-05-25 DIAGNOSIS — Z794 Long term (current) use of insulin: Secondary | ICD-10-CM

## 2018-05-25 DIAGNOSIS — E1122 Type 2 diabetes mellitus with diabetic chronic kidney disease: Secondary | ICD-10-CM

## 2018-05-25 DIAGNOSIS — E785 Hyperlipidemia, unspecified: Secondary | ICD-10-CM

## 2018-05-25 DIAGNOSIS — I43 Cardiomyopathy in diseases classified elsewhere: Secondary | ICD-10-CM

## 2018-05-25 DIAGNOSIS — I119 Hypertensive heart disease without heart failure: Secondary | ICD-10-CM

## 2018-05-25 DIAGNOSIS — F17200 Nicotine dependence, unspecified, uncomplicated: Secondary | ICD-10-CM

## 2018-05-25 NOTE — Progress Notes (Signed)
Date:  05/25/2018   Name:  Alyssa [name]   DOB:  1941-05-11   MRN:  [id number]   Chief Complaint: Diabetes  Diabetes  She presents for her follow-up diabetic visit. She has type 2 diabetes mellitus. Her disease course has been stable. There are no hypoglycemic associated symptoms. (But running on the low side since going into the care home.  Insulin dose decreased 3 weeks ago to 20 units) Pertinent negatives for diabetes include no chest pain. Current diabetic treatment includes oral agent (monotherapy). She is compliant with treatment all of the time. Her weight is stable. An ACE inhibitor/angiotensin II receptor blocker is being taken.  Hypertension  This is a chronic problem. The problem is controlled. Pertinent negatives include no chest pain, palpitations or shortness of breath. Past treatments include beta blockers, calcium channel blockers and ACE inhibitors. The current treatment provides significant improvement. There are no compliance problems.   Dementia - now under SS guardianship due to issues with safety at home and family unable to provide the support she needs.  She moved to a [facility] earlier this month.  She has sundowning that has not really improved with melatonin. She has refused her insulin for the past 4 days and blood sugars have remained less than 100.  Lab Results  Component Value Date   HGBA1C 5.7 (H) 01/21/2018    Review of Systems  Constitutional: Negative for appetite change and fever.  Respiratory: Negative for chest tightness and shortness of breath.   Cardiovascular: Negative for chest pain, palpitations and leg swelling.  Gastrointestinal: Negative for abdominal pain.  Skin: Negative for color change and rash.  Psychiatric/Behavioral: Negative for behavioral problems and sleep disturbance.    Patient Active Problem List   Diagnosis Date Noted  . Closed fracture of right distal humerus 04/15/2017  . History of falling 02/11/2017  . Hip  pain, left 01/31/2017  . Cardiomyopathy due to hypertension, without heart failure ([place]) 09/23/2016  . Cardiac murmur 06/03/2016  . Type 2 diabetes mellitus with stage 3 chronic kidney disease, with long-term current use of insulin ([place]) 12/06/2015  . Hyperlipidemia associated with type 2 diabetes mellitus ([place])   . Alzheimer's dementia without behavioral disturbance ([place]) 03/07/2015  . Coronary artery disease involving native coronary artery of native heart without angina pectoris 12/02/2014  . Allergic rhinitis 11/15/2014  . Essential hypertension 11/15/2014  . Arthritis, degenerative 11/15/2014  . Hypokalemia 11/15/2014  . H/O gastrointestinal hemorrhage 03/28/2011    No Known Allergies  Past Surgical History:  Procedure Laterality Date  . ABDOMINAL HYSTERECTOMY    . CHOLECYSTECTOMY    . TONSILLECTOMY      Social History   Tobacco Use  . Smoking status: Current Every Day Smoker    Packs/day: 0.25    Years: 10.00    Pack years: 2.50    Types: Cigarettes  . Smokeless tobacco: Current User    Types: Chew  Substance Use Topics  . Alcohol use: Yes    Alcohol/week: 1.0 standard drinks    Types: 1 Cans of beer per week  . Drug use: No     Medication list has been reviewed and updated.  Current Meds  Medication Sig  . amLODipine (NORVASC) 10 MG tablet TAKE (1) TABLET BY MOUTH EVERY DAY  . aspirin EC 81 MG tablet Take 81 mg by mouth daily.  . Blood Glucose Monitoring Suppl (TRUETRACK BLOOD GLUCOSE) w/Device KIT 1 each by Does not apply route daily.  Marland Kitchen  donepezil (ARICEPT) 5 MG tablet 1 TABLET BY MOUTH AT BEDTIME.  Marland Kitchen GLOBAL INSULIN SYRINGES 30G X 5/16" 0.3 ML MISC USE ONCE A DAY FOR INSULIN  . glucose blood (RELION PRIME TEST) test strip Test twice a day  . insulin detemir (LEVEMIR) 100 UNIT/ML injection Inject 0.2 mLs (20 Units total) into the skin at bedtime.  Marland Kitchen lisinopril (PRINIVIL,ZESTRIL) 40 MG tablet Take 1 tablet (40 mg total) by mouth daily.  . Melatonin 5 MG CAPS  Take 1 capsule (5 mg total) by mouth at bedtime.  . metFORMIN (GLUCOPHAGE) 500 MG tablet TAKE (1) TABLET BY MOUTH TWICE DAILY WITH A MEAL  . metoprolol succinate (TOPROL-XL) 50 MG 24 hr tablet TAKE (1) TABLET BY MOUTH EVERY DAY  . mirtazapine (REMERON) 15 MG tablet Take 1 tablet (15 mg total) by mouth at bedtime.  Marland Kitchen omeprazole (PRILOSEC) 40 MG capsule TAKE ONE CAPSULE TWICE A DAY  . potassium chloride SA (K-DUR,KLOR-CON) 20 MEQ tablet TAKE (1) TABLET BY MOUTH EVERY DAY  . simvastatin (ZOCOR) 40 MG tablet TAKE ONE TABLET BY MOUTH AT BEDTIME    PHQ 2/9 Scores 02/16/2018 01/21/2018 01/02/2017 12/12/2016  PHQ - 2 Score 0 0 0 -  PHQ- 9 Score - - - -  Exception Documentation - - - Other- indicate reason in comment box  Not completed - - - call completed w/ dtr    Physical Exam  Constitutional: She is oriented to person, place, and time. She appears well-developed. No distress.  HENT:  Head: Normocephalic and atraumatic.  Neck: Normal range of motion. Neck supple.  Cardiovascular: Normal rate, regular rhythm and normal pulses.  Murmur heard.  Systolic murmur is present with a grade of 3/6. Pulmonary/Chest: Effort normal and breath sounds normal. No respiratory distress.  Abdominal: Soft. There is no tenderness.  Musculoskeletal: Normal range of motion.  Lymphadenopathy:    She has no cervical adenopathy.  Neurological: She is alert and oriented to person, place, and time.  Skin: Skin is warm and dry. No rash noted.  Psychiatric: She has a normal mood and affect. Her behavior is normal. Thought content normal.  Nursing note and vitals reviewed.   BP (!) 141/82   Pulse 62   Resp 16   Ht '5\' 2"'  (1.575 m)   Wt 134 lb (60.8 kg)   SpO2 98%   BMI 24.51 kg/m   Assessment and Plan: 1. Essential hypertension controlled - Comprehensive metabolic panel - TSH  2. Cardiomyopathy due to hypertension, without heart failure (HCC) stable  3. Type 2 diabetes mellitus with stage 3 chronic kidney  disease, with long-term current use of insulin (HCC) Stop insulin and monitor BS - call if elevated - Comprehensive metabolic panel - Hemoglobin A1c - TSH  4. Alzheimer's dementia without behavioral disturbance, unspecified timing of dementia onset ([place]) Having some insomnia - continue melatonin To be seen by Psych nurse this week - will await recommendations  5. Hyperlipidemia associated with type 2 diabetes mellitus ([place]) On statin therapy - Lipid panel   Partially dictated using Editor, commissioning. Any errors are unintentional.  Halina Maidens, MD [facility]  05/25/2018

## 2018-05-26 LAB — TSH: TSH: 3.52 u[IU]/mL (ref 0.450–4.500)

## 2018-05-26 LAB — COMPREHENSIVE METABOLIC PANEL
A/G RATIO: 1.3 (ref 1.2–2.2)
ALBUMIN: 3.8 g/dL (ref 3.5–4.8)
ALT: 9 IU/L (ref 0–32)
AST: 23 IU/L (ref 0–40)
Alkaline Phosphatase: 63 IU/L (ref 39–117)
BUN/Creatinine Ratio: 9 — ABNORMAL LOW (ref 12–28)
BUN: 17 mg/dL (ref 8–27)
Bilirubin Total: 0.2 mg/dL (ref 0.0–1.2)
CALCIUM: 9.2 mg/dL (ref 8.7–10.3)
CO2: 21 mmol/L (ref 20–29)
CREATININE: 1.82 mg/dL — AB (ref 0.57–1.00)
Chloride: 102 mmol/L (ref 96–106)
GFR calc non Af Amer: 27 mL/min/{1.73_m2} — ABNORMAL LOW (ref >59)
GFR, EST AFRICAN AMERICAN: 31 mL/min/{1.73_m2} — AB (ref >59)
GLOBULIN, TOTAL: 2.9 g/dL (ref 1.5–4.5)
Glucose: 70 mg/dL (ref 65–99)
Potassium: 4.9 mmol/L (ref 3.5–5.2)
SODIUM: 139 mmol/L (ref 134–144)
TOTAL PROTEIN: 6.7 g/dL (ref 6.0–8.5)

## 2018-05-26 LAB — LIPID PANEL
Chol/HDL Ratio: 3.7 ratio (ref 0.0–4.4)
Cholesterol, Total: 170 mg/dL (ref 100–199)
HDL: 46 mg/dL (ref >39)
LDL CALC: 97 mg/dL (ref 0–99)
Triglycerides: 135 mg/dL (ref 0–149)
VLDL Cholesterol Cal: 27 mg/dL (ref 5–40)

## 2018-05-26 LAB — HEMOGLOBIN A1C
ESTIMATED AVERAGE GLUCOSE: 111 mg/dL
Hgb A1c MFr Bld: 5.5 % (ref 4.8–5.6)

## 2018-06-01 NOTE — Progress Notes (Signed)
Unable to reach [facility] about patient. Have not received a return call regarding her labs.

## 2018-06-10 ENCOUNTER — Other Ambulatory Visit: Payer: Self-pay

## 2018-06-10 DIAGNOSIS — I10 Essential (primary) hypertension: Secondary | ICD-10-CM

## 2018-06-29 ENCOUNTER — Encounter: Payer: Self-pay | Admitting: Internal Medicine

## 2018-06-29 ENCOUNTER — Ambulatory Visit (INDEPENDENT_AMBULATORY_CARE_PROVIDER_SITE_OTHER): Payer: Medicare Other | Admitting: Internal Medicine

## 2018-06-29 VITALS — BP 130/68 | HR 66 | Ht 62.0 in | Wt 136.4 lb

## 2018-06-29 DIAGNOSIS — F5105 Insomnia due to other mental disorder: Secondary | ICD-10-CM

## 2018-06-29 DIAGNOSIS — I10 Essential (primary) hypertension: Secondary | ICD-10-CM | POA: Diagnosis not present

## 2018-06-29 DIAGNOSIS — N183 Chronic kidney disease, stage 3 unspecified: Secondary | ICD-10-CM

## 2018-06-29 DIAGNOSIS — G4700 Insomnia, unspecified: Secondary | ICD-10-CM | POA: Insufficient documentation

## 2018-06-29 DIAGNOSIS — F0280 Dementia in other diseases classified elsewhere without behavioral disturbance: Secondary | ICD-10-CM

## 2018-06-29 DIAGNOSIS — G309 Alzheimer's disease, unspecified: Secondary | ICD-10-CM | POA: Diagnosis not present

## 2018-06-29 DIAGNOSIS — E1122 Type 2 diabetes mellitus with diabetic chronic kidney disease: Secondary | ICD-10-CM | POA: Diagnosis not present

## 2018-06-29 DIAGNOSIS — K5900 Constipation, unspecified: Secondary | ICD-10-CM

## 2018-06-29 DIAGNOSIS — Z794 Long term (current) use of insulin: Secondary | ICD-10-CM

## 2018-06-29 DIAGNOSIS — F99 Mental disorder, not otherwise specified: Secondary | ICD-10-CM

## 2018-06-29 MED ORDER — POLYETHYLENE GLYCOL 3350 17 GM/SCOOP PO POWD: 17.0000 g | ORAL | 5 refills | Status: AC

## 2018-06-29 MED ORDER — MELATONIN 5 MG PO CAPS: 2.0000 | ORAL_CAPSULE | Freq: Every day | ORAL | 5 refills | Status: AC

## 2018-06-29 NOTE — Progress Notes (Signed)
Date:  06/29/2018   Name:  Alyssa [name]   DOB:  [birth year]   MRN:  [id number]   Chief Complaint: Diabetes Here with the administrator of the Care Home. Diabetes  She presents for her follow-up diabetic visit. She has type 2 diabetes mellitus. Her disease course has been stable. Hypoglycemia symptoms include confusion. Pertinent negatives for hypoglycemia include no dizziness, headaches or nervousness/anxiousness. Pertinent negatives for diabetes include no chest pain. Symptoms are stable. Current diabetic treatment includes oral agent (monotherapy). She is compliant with treatment all of the time. Her weight is stable. She monitors blood glucose at home 1-2 x per day. Her breakfast blood glucose is taken between 5-6 am. Her breakfast blood glucose range is generally 90-110 mg/dl. An ACE inhibitor/angiotensin II receptor blocker is being taken.  Hypertension  This is a chronic problem. The problem is unchanged. The problem is controlled. Pertinent negatives include no chest pain, headaches or shortness of breath. Past treatments include ACE inhibitors and beta blockers. The current treatment provides significant improvement.  Constipation  This is a new problem. The problem is unchanged. Her stool frequency is 2 to 3 times per week (usually has to strain). Pertinent negatives include no abdominal pain or fever.  Dementia/sundowning - seen by Psych nurse who just recently scheduled Ativan 0.5 mg at 5 pm.  She is also on Melatonin 5 mg at qhs.  She is still up late, wandering the halls and going into other residents rooms.     Review of Systems  Constitutional: Negative for chills and fever.  HENT: Negative for trouble swallowing.   Eyes: Negative for visual disturbance.  Respiratory: Negative for chest tightness and shortness of breath.   Cardiovascular: Negative for chest pain and leg swelling.  Gastrointestinal: Positive for constipation. Negative for abdominal pain and blood in stool.    Genitourinary: Negative for hematuria.  Musculoskeletal: Negative for gait problem.  Skin: Negative for color change and rash.  Neurological: Negative for dizziness and headaches.  Psychiatric/Behavioral: Positive for confusion and sleep disturbance. The patient is not nervous/anxious.     Patient Active Problem List   Diagnosis Date Noted  . Tobacco use disorder 05/25/2018  . Closed fracture of right distal humerus 04/15/2017  . History of falling 02/11/2017  . Hip pain, left 01/31/2017  . Cardiomyopathy due to hypertension, without heart failure ([place]) 09/23/2016  . Cardiac murmur 06/03/2016  . Type 2 diabetes mellitus with stage 3 chronic kidney disease, with long-term current use of insulin ([place]) 12/06/2015  . Hyperlipidemia associated with type 2 diabetes mellitus ([place])   . Alzheimer's dementia without behavioral disturbance ([place]) 03/07/2015  . Coronary artery disease involving native coronary artery of native heart without angina pectoris 12/02/2014  . Allergic rhinitis 11/15/2014  . Essential hypertension 11/15/2014  . Arthritis, degenerative 11/15/2014  . Hypokalemia 11/15/2014  . H/O gastrointestinal hemorrhage 03/28/2011    No Known Allergies  Past Surgical History:  Procedure Laterality Date  . ABDOMINAL HYSTERECTOMY    . CHOLECYSTECTOMY    . TONSILLECTOMY      Social History   Tobacco Use  . Smoking status: Current Every Day Smoker    Packs/day: 0.25    Years: 10.00    Pack years: 2.50    Types: Cigarettes  . Smokeless tobacco: Current User    Types: Chew  Substance Use Topics  . Alcohol use: Yes    Alcohol/week: 1.0 standard drinks    Types: 1 Cans of beer per week  .  Drug use: No     Medication list has been reviewed and updated.  Current Meds  Medication Sig  . amLODipine (NORVASC) 10 MG tablet TAKE (1) TABLET BY MOUTH EVERY DAY  . aspirin EC 81 MG tablet Take 81 mg by mouth daily.  . Blood Glucose Monitoring Suppl (TRUETRACK BLOOD GLUCOSE)  w/Device KIT 1 each by Does not apply route daily.  Marland Kitchen donepezil (ARICEPT) 5 MG tablet 1 TABLET BY MOUTH AT BEDTIME.  Marland Kitchen glucose blood (RELION PRIME TEST) test strip Test twice a day  . lisinopril (PRINIVIL,ZESTRIL) 40 MG tablet Take 1 tablet (40 mg total) by mouth daily.  Marland Kitchen LORazepam (ATIVAN) 0.5 MG tablet Take 0.25 mg by mouth every 8 (eight) hours.  . Melatonin 5 MG CAPS Take 1 capsule (5 mg total) by mouth at bedtime.  . metFORMIN (GLUCOPHAGE) 500 MG tablet TAKE (1) TABLET BY MOUTH TWICE DAILY WITH A MEAL  . metoprolol succinate (TOPROL-XL) 50 MG 24 hr tablet TAKE (1) TABLET BY MOUTH EVERY DAY  . mirtazapine (REMERON) 15 MG tablet Take 1 tablet (15 mg total) by mouth at bedtime.  Marland Kitchen omeprazole (PRILOSEC) 40 MG capsule TAKE ONE CAPSULE TWICE A DAY  . potassium chloride SA (K-DUR,KLOR-CON) 20 MEQ tablet TAKE (1) TABLET BY MOUTH EVERY DAY  . simvastatin (ZOCOR) 40 MG tablet TAKE ONE TABLET BY MOUTH AT BEDTIME    PHQ 2/9 Scores 02/16/2018 01/21/2018 01/02/2017 12/12/2016  PHQ - 2 Score 0 0 0 -  PHQ- 9 Score - - - -  Exception Documentation - - - Other- indicate reason in comment box  Not completed - - - call completed w/ dtr    Physical Exam  Constitutional: She appears well-developed and well-nourished.  Neck: Normal range of motion. Neck supple.  Cardiovascular: Normal rate, regular rhythm and normal heart sounds.  Pulmonary/Chest: Effort normal and breath sounds normal.  Abdominal: Soft. She exhibits no distension. There is no tenderness.  Musculoskeletal: She exhibits no edema.  Lymphadenopathy:    She has no cervical adenopathy.  Neurological: She is alert.  Skin: Skin is warm and dry.  Psychiatric: Her speech is delayed. She is withdrawn. Cognition and memory are impaired.    BP 130/68 (BP Location: Right Arm, Patient Position: Sitting, Cuff Size: Normal)   Pulse 66   Ht '5\' 2"'  (1.575 m)   Wt 136 lb 6.4 oz (61.9 kg)   SpO2 99%   BMI 24.95 kg/m   Assessment and Plan: 1. Type 2  diabetes mellitus with stage 3 chronic kidney disease, with long-term current use of insulin (HCC) Doing well with normal glucoses off of insulin  2. Essential hypertension controlled  3. Alzheimer's dementia without behavioral disturbance, unspecified timing of dementia onset ([place]) Will give the scheduled ativan a few days to see if she improves Can increase melatonin to 10 mg Psych nurse to do follow up in 3 weeks  4. Insomnia due to other mental disorder - Melatonin 5 MG CAPS; Take 2 capsules (10 mg total) by mouth at bedtime.  Dispense: 60 capsule; Refill: 5  5. Constipation, unspecified constipation type Begin Miralax 3 times per week - polyethylene glycol powder (GLYCOLAX/MIRALAX) powder; Take 17 g by mouth 3 (three) times a week.  Dispense: 225 g; Refill: 5   Partially dictated using Editor, commissioning. Any errors are unintentional.  Halina Maidens, MD [facility]  06/29/2018

## 2018-10-01 ENCOUNTER — Ambulatory Visit: Payer: Medicare Other | Admitting: Internal Medicine

## 2018-11-19 ENCOUNTER — Telehealth: Payer: Self-pay

## 2018-11-19 NOTE — Telephone Encounter (Signed)
Spoke with Elvis Coil, Patients caregiver.  She stated that the patient would not be good for a telehealth visit.  She stated that the patient has not had any issues and would like to wait until the office opens up for a visit.  Old recall will be deleted and new recall will be placed for Aug.

## 2019-08-11 ENCOUNTER — Ambulatory Visit: Payer: Medicare HMO | Admitting: Cardiovascular Disease

## 2019-08-29 NOTE — Progress Notes (Signed)
Cardiology Office Note  Date:  08/30/2019   ID:  Alyssa [name], Alyssa [name] [birth year], MRN [id number]  PCP:  Orvis Brill, [facility]   Chief Complaint  Patient presents with  . other    Uncontrolled BP c/o edema hands and ankles. Meds reviewed verbally with pt.    HPI:  79 year old female with history of  CAD with cath in 2010 that showed minimal CAD,  DM2,  HLD,  migraines   [facility] October 25 2014 for hypertensive urgency and demand ischemia  [facility] on 10/27/14 with uncontrolled HTN with BP greater than 200/100 and elevated troponin and chest pain.  Alzheimer's dementia /insomnia Previous hospital admission 2016 Echo 2016 showed an EF of 35-40% with severe LVH and global hypokinesis likely hypertensive heart disease.  She presents for by referrel from Dr. Durwin Reges for new patient evaluation of her blood pressure.  Last seen in cardiology clinic in 2017  On today's visit she presents with one of the associates from [facility] where she lives in [place] Blood pressure continues to run high Smokes 3 cig, not every day  Notes indicating history of dementia/sundowning -previously seen by Psych nurse Notes indicating she is up late, wandering the halls and going into other residents rooms on ativan, seems to help her sleep  Medications for blood pressure include clonidine 0.1 mg twice daily, hydralazine 25 twice daily, amlodipine 10 daily with metoprolol succinate 100 daily  Nurses who present with her today reports pressure continues to run in the 256 systolic range  Lots of walking, no symptoms of shortness of breath chest tightness No leg edema  Previous records reviewed Myoview stress test June 2016 No ischemia  Lab work reviewed normal LFTs, creatinine 3.2, potassium 4.2, sodium 140, hematocrit 26.5  EKG on today's visit shows normal sinus rhythm with rate 62 bpm, diffuse T-wave abnormality, LVH, no significant change from EKG 2016  Other  past medical history reviewed She was [facility] for chest pain echo at that time time showed EF 55%, no LV thrombus, no wall motion abnormalities, trace AI.  Cardiac enzymes were negative at that time. She underwent Myoview stress test that showed borderline abnormal Myoview with apical thinning. No evidence of stress-induced myocardial ischemia and ejection fraction of 60%. Medical management recommended.   cardiac catheterization 2010 done by Dr. 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 No intervention was done.   felt she either had costochondritis vs stress induced chest pain.   PMH:   has a past medical history of Chronic systolic CHF (congestive heart failure) ([place]), CKD (chronic kidney disease), stage III, Dementia ([place]), Essential hypertension, Hyperlipidemia, Hypertensive Non-ischemic Cardiomyopathy, Hypokalemia, and Type II diabetes mellitus ([place]).  PSH:    Past Surgical History:  Procedure Laterality Date  . ABDOMINAL HYSTERECTOMY    . CHOLECYSTECTOMY    . TONSILLECTOMY      Current Outpatient Medications  Medication Sig Dispense Refill  . amLODipine (NORVASC) 10 MG tablet TAKE (1) TABLET BY MOUTH EVERY DAY 90 tablet 3  . aspirin EC 81 MG tablet Take 81 mg by mouth daily.    . Blood Glucose Monitoring Suppl (TRUETRACK BLOOD GLUCOSE) w/Device KIT 1 each by Does not apply route daily. 1 each 0  . Calcium Carbonate-Vitamin D (CALCIUM 600/VITAMIN D PO) Take by mouth daily.    . cloNIDine (CATAPRES) 0.2 MG tablet Take 1 tablet (0.2 mg total) by mouth 2 (two) times  daily. 60 tablet 6  . donepezil (ARICEPT) 10 MG tablet Take 10 mg by mouth at bedtime.    . ferrous sulfate 325 (65 FE) MG tablet Take 325 mg by mouth daily with breakfast.    . glipiZIDE (GLUCOTROL) 5 MG tablet Take by mouth daily before breakfast.    . GLOBAL INSULIN SYRINGES 30G X 5/16" 0.3 ML MISC USE ONCE A DAY FOR INSULIN 100 each 1  . glucose  blood (RELION PRIME TEST) test strip Test twice a day 100 each 12  . hydrALAZINE (APRESOLINE) 50 MG tablet Take 1 tablet (50 mg total) by mouth 2 (two) times daily. 120 tablet 6  . LORazepam (ATIVAN) 0.5 MG tablet Take 0.25 mg by mouth 2 (two) times daily.     . Melatonin 5 MG CAPS Take 2 capsules (10 mg total) by mouth at bedtime. (Patient taking differently: Take 1 capsule by mouth at bedtime. ) 60 capsule 5  . metoprolol succinate (TOPROL-XL) 100 MG 24 hr tablet Take 100 mg by mouth daily. Take with or immediately following a meal.    . mirtazapine (REMERON) 15 MG tablet Take 1 tablet (15 mg total) by mouth at bedtime. 30 tablet 5  . omeprazole (PRILOSEC) 40 MG capsule TAKE ONE CAPSULE TWICE A DAY 60 capsule 5  . polyethylene glycol powder (GLYCOLAX/MIRALAX) powder Take 17 g by mouth 3 (three) times a week. 225 g 5  . simvastatin (ZOCOR) 40 MG tablet TAKE ONE TABLET BY MOUTH AT BEDTIME 90 tablet 1   No current facility-administered medications for this visit.     Allergies:   Patient has no known allergies.   Social History:  The patient  reports that she has been smoking cigarettes. She has a 2.50 pack-year smoking history. Her smokeless tobacco use includes chew. She reports previous alcohol use of about 1.0 standard drinks of alcohol per week. She reports that she does not use drugs.   Family History:   family history includes Diabetes in her father and mother; Hypertension in her father and mother.    Review of Systems: Review of Systems  Constitutional: Negative.   HENT: Negative.   Respiratory: Negative.   Cardiovascular: Negative.   Gastrointestinal: Negative.   Musculoskeletal: Negative.   Neurological: Negative.   Psychiatric/Behavioral: Negative.   All other systems reviewed and are negative.   PHYSICAL EXAM: VS:  BP (!) 178/64 (BP Location: Right Arm, Patient Position: Sitting, Cuff Size: Normal)   Pulse 62   Ht '5\' 3"'  (1.6 m)   Wt 125 lb 8 oz (56.9 kg)   SpO2 99%    BMI 22.23 kg/m  , BMI Body mass index is 22.23 kg/m. GEN: Well nourished, well developed, in no acute distress HEENT: normal Neck: no JVD, carotid bruits, or masses Cardiac: RRR; no murmurs, rubs, or gallops,no edema  Respiratory:  clear to auscultation bilaterally, normal work of breathing GI: soft, nontender, nondistended, + BS MS: no deformity or atrophy Skin: warm and dry, no rash Neuro:  Strength and sensation are intact Psych: Full affect, minimally communicative   Recent Labs: No results found for requested labs within last 8760 hours.    Lipid Panel Lab Results  Component Value Date   CHOL 170 05/25/2018   HDL 46 05/25/2018   LDLCALC 97 05/25/2018   TRIG 135 05/25/2018      Wt Readings from Last 3 Encounters:  08/30/19 125 lb 8 oz (56.9 kg)  06/29/18 136 lb 6.4 oz (61.9 kg)  05/25/18 134  lb (60.8 kg)      ASSESSMENT AND PLAN:  Problem List Items Addressed This Visit      Cardiology Problems   Essential hypertension - Primary (Chronic)   Relevant Medications   metoprolol succinate (TOPROL-XL) 100 MG 24 hr tablet   cloNIDine (CATAPRES) 0.2 MG tablet   hydrALAZINE (APRESOLINE) 50 MG tablet    Other Visit Diagnoses    Alzheimer's disease of other onset with behavioral disturbance (HCC)       Relevant Medications   donepezil (ARICEPT) 10 MG tablet   Type 2 diabetes mellitus without complication, without long-term current use of insulin (HCC)       Relevant Medications   glipiZIDE (GLUCOTROL) 5 MG tablet   Coronary artery disease involving native coronary artery of native heart with angina pectoris (HCC)       Relevant Medications   metoprolol succinate (TOPROL-XL) 100 MG 24 hr tablet   cloNIDine (CATAPRES) 0.2 MG tablet   hydrALAZINE (APRESOLINE) 50 MG tablet   Other Relevant Orders   EKG 12-Lead     Malignant hypertension Recommend we increase clonidine up to 0.2 mg twice daily, increase hydralazine up to 50 mg 4 times daily Stay on current dose  of metoprolol succinate 100 daily with amlodipine 10 daily --Recommend we monitor blood pressures over the next several weeks If it continues to run high clonidine can be increased up to 0.2 mg 3 times daily (or 0.3 twice daily) Hydralazine can also be increased up to 100 mg --Additional options for medications include adding nitrates such as Imdur 30, with monitoring even up to 60 mg or higher Will avoid diuretics given renal dysfunction  LVH Murmur appreciated on exam secondary to LVH, likely degree of outflow tract obstruction Reports she is asymptomatic Down the road could consider repeat echocardiogram Would consider this once blood pressure is well controlled  Type 2 diabetes with complications, renal dysfunction Hemoglobin A1c relatively well controlled  Chronic renal failure Creatinine 3.2 in November 2020 ACE inhibitor held Stressed importance of aggressive blood pressure control  Anemia: Possibly secondary to chronic kidney disease By report no melena or hematochezia, ferritin 50 May need referral to nephrology, could consider EPO  Smoker Smoking cessation recommended, smoking 3 cigarettes or less   Disposition:   F/U  2 months  Records reviewed  Total encounter time more than 60 minutes  Greater than 50% was spent in counseling and coordination of care with the patient    Signed, Esmond Plants, M.D., Ph.D. [place], [place]

## 2019-08-30 ENCOUNTER — Encounter: Payer: Self-pay | Admitting: Cardiovascular Disease

## 2019-08-30 ENCOUNTER — Ambulatory Visit (INDEPENDENT_AMBULATORY_CARE_PROVIDER_SITE_OTHER): Payer: Medicare Other | Admitting: Cardiovascular Disease

## 2019-08-30 ENCOUNTER — Other Ambulatory Visit: Payer: Self-pay

## 2019-08-30 VITALS — BP 178/64 | HR 62 | Ht 63.0 in | Wt 125.5 lb

## 2019-08-30 DIAGNOSIS — E119 Type 2 diabetes mellitus without complications: Secondary | ICD-10-CM

## 2019-08-30 DIAGNOSIS — F0281 Dementia in other diseases classified elsewhere with behavioral disturbance: Secondary | ICD-10-CM

## 2019-08-30 DIAGNOSIS — G308 Other Alzheimer's disease: Secondary | ICD-10-CM | POA: Diagnosis not present

## 2019-08-30 DIAGNOSIS — I25119 Atherosclerotic heart disease of native coronary artery with unspecified angina pectoris: Secondary | ICD-10-CM

## 2019-08-30 DIAGNOSIS — I10 Essential (primary) hypertension: Secondary | ICD-10-CM | POA: Diagnosis not present

## 2019-08-30 DIAGNOSIS — F02818 Dementia in other diseases classified elsewhere, unspecified severity, with other behavioral disturbance: Secondary | ICD-10-CM

## 2019-08-30 MED ORDER — CLONIDINE HCL 0.2 MG PO TABS: 0.2000 mg | ORAL_TABLET | Freq: Two times a day (BID) | ORAL | 6 refills | Status: DC

## 2019-08-30 MED ORDER — HYDRALAZINE HCL 50 MG PO TABS: 50.0000 mg | ORAL_TABLET | Freq: Two times a day (BID) | ORAL | 6 refills | Status: DC

## 2019-08-30 NOTE — Patient Instructions (Addendum)
Medication Instructions:  Please increase the clonidine 0.2 mg twice a day Increase the hydralazine up to 50 mg four times a day  Please call with blood pressure numbers   If you need a refill on your cardiac medications before your next appointment, please call your pharmacy.    Lab work: No new labs needed   If you have labs (blood work) drawn today and your tests are completely normal, you will receive your results only by: Marland Kitchen MyChart Message (if you have MyChart) OR . A paper copy in the mail If you have any lab test that is abnormal or we need to change your treatment, we will call you to review the results.   Testing/Procedures: No new testing needed   Follow-Up: At [facility], you and your health needs are our priority.  As part of our continuing mission to provide you with exceptional heart care, we have created designated Provider Care Teams.  These Care Teams include your primary Cardiologist (physician) and Advanced Practice Providers (APPs -  Physician Assistants and Nurse Practitioners) who all work together to provide you with the care you need, when you need it.  . You will need a follow up appointment in 2 -3 months Please call with numbers at the end of Feb  . Providers on your designated Care Team:   . Murray Hodgkins, NP . Christell Faith, PA-C . Marrianne Mood, PA-C  Any Other Special Instructions Will Be Listed Below (If Applicable).  For educational health videos Log in to : [email or web address] Or : [email or web address], password : [email or web address]

## 2019-11-25 NOTE — Progress Notes (Signed)
Office Visit    Patient Name: Alyssa [name] Date of Encounter: 11/29/2019  Primary Care Provider:  Housecalls, [name] Making Primary Cardiologist:  Alyssa Rogue, MD  Chief Complaint    Chief Complaint  Patient presents with  . Other    Pt has no complaints. Meds verbally reviewed w/ pt.     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  Past Medical History    Past Medical History:  Diagnosis Date  . Chronic systolic CHF (congestive heart failure) ([place])   . CKD (chronic kidney disease), stage III   . Dementia ([place])   . Essential hypertension   . Hyperlipidemia   . Hypertensive Non-ischemic Cardiomyopathy    a. 2010 Echo: EF 55%;  b. 2010 borderline MV w/ apical thinning; c. 2010 Cath: LCX 50p->Med Rx;  d. 09/2014 Echo: EF 35-40% w/ Sev LVH and glob HK;  e. 09/2014 MV: EF 30-44%, small fixed apical defect (breast attenuation), no ischemia.  . Hypokalemia   . Type II diabetes mellitus ([place])    Past Surgical History:  Procedure Laterality Date  . ABDOMINAL HYSTERECTOMY    . CHOLECYSTECTOMY    . TONSILLECTOMY      Allergies  No Known Allergies  History of Present Illness    Alyssa [name] is a 79 y.o. female with PMH as above.  She underwent cardiac catheterization in 2010 by Dr. Clayborn [name] that showed 50% proximal circumflex lesion and calcium in the proximal vessels.  No intervention was performed and medical management was recommended.  2010 echo showed EF 55%.  She was hospitalized 09/2014 for hypertensive urgency and demand ischemia.  BP was noted to be greater than 200/100.  She underwent 01/05/2015 Lexiscan was ruled a high risk study due to decreased ejection fraction at 30 to 44%.  A small fixed apical defect was reviewed and thought  likely 2/2 breast attenuation.  No clear evidence of reversibility to suggest ischemia was noted.  2016 echo showed EF of 35 to 40% with severe LVH and global hypokinesis likely due to hypertensive heart disease.  2017 echo, performed and read by an outside cardiology group, showed mild concentric LVH, normal LVSF estimated greater than 12%, mild diastolic dysfunction, calcification of the mitral annulus, mild AS/AI.   When last seen by her primary cardiologist 08/30/19, she presented from [place] [name] in [place].  It was noted her blood pressure continued to run high per the staff with SBP 180s.  She was smoking 3 cigarettes daily but not every day.  She was walking a lot without concerning sx.  A murmur was appreciated on physical exam and thought secondary to LVH and likely due to outflow tract obstruction. Echo deferred. For BP control, clonidine was increased to 0.54m BID and hydralazine increased to 576mfour times daily per note with Rx noting hydralazine 502mID and clarification from primary cardiologist pending as below in A/P. She was continued on Toprol 100m8m and amlodipine 10mg98mly. Given her renal function, diuretics were noted as to be avoided with possible future referral to nephrology / consideration of EPO.  Today, she reports to clinic for follow-up and is joined by a nurse from her [name] facility/[name].  History and review of symptoms limited by dementia/Alzheimer's and  most of the below information obtained via discussion with her nurse.  She continues to struggle with blood pressure control with the nurse reporting SBP 170s at her assisted [name] facility and BP today 172/60, despite recent changes made to her medications at her 08/2019 visit as above. She denies any sx with elevated BP, such as vision changes, dizziness, or headache. Currently, the nursing staff is only checking her blood pressure once per week with  prescription/order written as below in A/P to obtain daily BP checks going forward and to help guide medication management.  Her most recent progress notes from her ALF provided today with a detailed review of her medications as listed below and any discrepancies clarified / rectified. Of notes, she has been taking hydralazine 50 mg twice daily, rather than 4 times daily as indicated in her most recent progress note with her primary cardiologist.  Given her most recent prescription, however, is for hydralazine 49m twice daily. I have reached out for clarification from her primary cardiologist with updates pending. She continues to take amlodipine 10 mg daily and has been compliant with increased clonidine 0.2 mg twice daily.  She denies any chest pain, racing heart rate, or palpitations.  No presyncope, syncope, or loss of consciousness.  No recent falls.  No signs or symptoms consistent with bleeding.  No shortness of breath, DOE, abdominal distention, orthopnea, PND, or S/SX of worsening heart failure.  She continues to smoke 3 cigarettes but notes that this is every day. She continues to want to quit smoking. She is still walking regularly without sx of CP or SOB.  She has recently been referred to nephrology with [facility]   Home Medications    Prior to Admission medications   Medication Sig Start Date End Date Taking? Authorizing Provider  amLODipine (NORVASC) 10 MG tablet TAKE (1) TABLET BY MOUTH EVERY DAY 01/24/18   BGlean Hess MD  aspirin EC 81 MG tablet Take 81 mg by mouth daily.    [provider]  Blood Glucose Monitoring Suppl (TRUETRACK BLOOD GLUCOSE) w/Device KIT 1 each by Does not apply route daily. 12/06/15   BGlean Hess MD  Calcium Carbonate-Vitamin D (CALCIUM 600/VITAMIN D PO) Take by mouth daily.    [provider]  cloNIDine (CATAPRES) 0.2 MG tablet Take 1 tablet (0.2 mg total) by mouth 2 (two)  times daily. 08/30/19   GMinna Merritts MD  donepezil (ARICEPT) 10 MG tablet Take 10 mg by mouth at bedtime.    [provider]  ferrous sulfate 325 (65 FE) MG tablet Take 325 mg by mouth daily with breakfast.    [provider]  glipiZIDE (GLUCOTROL) 5 MG tablet Take by mouth daily before breakfast.    [provider]  GLOBAL INSULIN SYRINGES 30G X 5/16" 0.3 ML MISC USE ONCE A DAY FOR INSULIN 12/06/17   BGlean Hess MD  glucose blood (RELION PRIME TEST) test strip Test twice a day 08/18/17   BGlean Hess MD  hydrALAZINE (APRESOLINE) 50 MG tablet Take 1 tablet (50 mg total) by mouth 2 (two) times daily. 08/30/19   GMinna Merritts MD  LORazepam (ATIVAN) 0.5 MG tablet Take 0.25 mg by mouth 2 (two) times daily.     [provider]  Melatonin 5 MG CAPS Take 2 capsules (10 mg total) by mouth at bedtime. Patient taking differently: Take 1 capsule by mouth at bedtime.  06/29/18  Glean Hess, MD  metoprolol succinate (TOPROL-XL) 100 MG 24 hr tablet Take 100 mg by mouth daily. Take with or immediately following a meal.    [provider]  mirtazapine (REMERON) 15 MG tablet Take 1 tablet (15 mg total) by mouth at bedtime. 02/16/18   Glean Hess, MD  omeprazole (PRILOSEC) 40 MG capsule TAKE ONE CAPSULE TWICE A DAY 01/24/18   Glean Hess, MD  polyethylene glycol powder (GLYCOLAX/MIRALAX) powder Take 17 g by mouth 3 (three) times a week. 06/29/18   Glean Hess, MD  simvastatin (ZOCOR) 40 MG tablet TAKE ONE TABLET BY MOUTH AT BEDTIME 04/23/18   Glean Hess, MD    Review of Systems    She denies chest pain, palpitations, dyspnea, pnd, orthopnea, n, v, dizziness, syncope, edema, weight gain, or early satiety.   All other systems reviewed and are otherwise negative except as noted above.  Physical Exam    VS:  BP (!) 172/60 (BP Location: Left Arm, Patient Position: Sitting, Cuff Size: Normal)   Pulse (!) 57   Ht 5' (1.524 m)    Wt 121 lb 8 oz (55.1 kg)   SpO2 98%   BMI 23.73 kg/m  , BMI Body mass index is 23.73 kg/m. GEN: Frail and elderly female, no acute distress, mask in place.  Joined by ALF nursing staff. HEENT: normal. Neck: Supple, no JVD, mild bilateral carotid bruits versus radiation from murmur as below. no masses. Cardiac: RRR, harsh 2/6 systolic murmur best appreciated in RUSB.  No rubs, or gallops. No clubbing, cyanosis.  1+ bilateral lower extremity edema.  Radials/DP/PT 2+ and equal bilaterally.  Respiratory:  Respirations regular and unlabored, clear to auscultation bilaterally. GI: Soft, nontender, nondistended, BS + x 4. MS: no deformity or atrophy. Skin: warm and dry, no rash. Neuro:  Strength and sensation are intact. Dementia noted and chronic. Psych: Normal affect.  Pleasant.    Accessory Clinical Findings    ECG personally reviewed by me today -sinus bradycardia, 57 bpm, QRS 98 ms, PR interval 122, LVH, inferior lateral ischemia seen on previous EKGs- no acute changes.  VITALS Reviewed today   Temp Readings from Last 3 Encounters:  01/21/18 98.1 F (36.7 C) (Oral)  11/12/17 97.6 F (36.4 C) (Oral)  06/04/17 98.1 F (36.7 C) (Oral)   BP Readings from Last 3 Encounters:  11/29/19 (!) 172/60  08/30/19 (!) 178/64  06/29/18 130/68   Pulse Readings from Last 3 Encounters:  11/29/19 (!) 57  08/30/19 62  06/29/18 66    Wt Readings from Last 3 Encounters:  11/29/19 121 lb 8 oz (55.1 kg)  08/30/19 125 lb 8 oz (56.9 kg)  06/29/18 136 lb 6.4 oz (61.9 kg)     LABS  reviewed today   [place] present and most recent? Yes/No: No   Please see most recent scanned in labs.    Per 10/04/2019 ALF labs: Hgb 8.6, MCV 92.6, hematocrit 27.5, RBC 2.97 Albumin 2.7 Creatinine 5.20, BUN 45  Ca++ 7.7, sodium 140 Glucose 46 K+ 4.3  Per 07/14/2019 ALF labs: Hgb 8.5, MCV 31.0, hematocrit 27.4, RBC 2.86 Albumin 3.1 Creatinine 3.10, BUN 35 Calcium 8.4, sodium 139 Glucose  144 Potassium 4.1  Lab Results  Component Value Date   WBC 5.5 11/12/2017   HGB 10.2 (L) 11/12/2017   HCT 30.5 (L) 11/12/2017   MCV 87.7 11/12/2017   PLT 202 11/12/2017   Lab Results  Component Value Date   CREATININE 1.82 (H)  05/25/2018   BUN 17 05/25/2018   NA 139 05/25/2018   K 4.9 05/25/2018   CL 102 05/25/2018   CO2 21 05/25/2018   Lab Results  Component Value Date   ALT 9 05/25/2018   AST 23 05/25/2018   ALKPHOS 63 05/25/2018   BILITOT 0.2 05/25/2018   Lab Results  Component Value Date   CHOL 170 05/25/2018   HDL 46 05/25/2018   LDLCALC 97 05/25/2018   TRIG 135 05/25/2018   CHOLHDL 3.7 05/25/2018    Lab Results  Component Value Date   HGBA1C 5.5 05/25/2018   Lab Results  Component Value Date   TSH 3.520 05/25/2018     STUDIES/PROCEDURES reviewed today   Bilateral Carotids 05/2017 IMPRESSION:  1. Slight calcific plaque formation is seen bilaterally.  2. No hemodynamically significant stenosis is observed on either side.  3. There is antegrade flow in both vertebrals.   MPI 12/2014  Downsloping ST segment depression ST segment depression was noted during stress.  T wave inversion was noted during stress.  This is a high risk study mainly due to decreased EF.  The left ventricular ejection fraction is moderately decreased (30-44%).  There is a small fixed apical defect which is likely due to breast attenuation.  No clear evidence of reversibility to suggest ischemia.  Outside Echo 11/24/10 INTERPRETATION MILD CONCENTRIC LVH NORMAL LV SYSTOLIC FUNCTION MILD DIASTOLIC DYSFUNCTION (IMPAIRED RELAXATION) CALCIFICATION OF THE MITRAL ANNULUS MILD AORTIC STENOSIS, MILD AORTIC INSUFFICIENCY   Assessment & Plan    Resistant hypertension --BP remains uncontrolled, despite recent 08/2019 adjustments to her blood pressure regimen. She is asx with BP checked once per week at ALF. At her previous visit, clonidine was increased to 0.2 mg twice daily.   Hydralazine was also increased to 50 mg 4 times daily; however, on review of her most recent prescriptions, hydralazine was listed as hydralazine 50 mg twice daily with nurse today reporting that she has only been taking hydralazine twice daily as indicated by the prescription.  Primary cardiologist contacted same day with previous progress note forwarded to him for clarification of dosing.  Current medication changes limited by renal function. --Increase clonidine to 0.2 mg 3 times per day. Hopefully, this increased frequency of dosing will reduce risk of rebound HTN and provide more BP control. Increase hydralazine 50 mg to 3 times per day and pending primary cardiologist update on frequency of dosing (4 times daily versus intake prescription for BID dosing). Will defer increasing to four times daily frequency pending this update and to avoid sudden drops in pressure. She continues to ambulate on her own (without cane or walker) with heart rate 57bpm today and risk of fall with sudden drop in BP, as well as prerenal AKI.  Continue current amlodipine and metoprolol doses.  --Ordered updated echo given harsh systolic murmur with ongoing poorly controlled BP and EKG with LVH.  --Agree with nephrology referral.  Avoid diuretics/ACE/ARBs. Renally dose medications if indicated.  Discussed possible future scan to rule out renal artery stenosis or as recommended by nephrology. --Smoking cessation advised.  DM2 / glycemic control recommended. Continue to monitor volume intake / low-salt diet.   --Hand written order/prescription provided for ALF RN to start checking blood pressure daily.  ALF RN indicated today that she will fax [place] an update of blood pressures in 2 weeks to keep [place] in the loop.   2/6 Systolic Murmur --Updating echo as above given harsh 2/6 systolic murmur on exam (best heard in  RUSB) with ongoing resistant BP.  No reported dizziness or progressive shortness of breath consistent with worsening  valvular disease but with consideration of current dementia.  Previous echo as above from an outside cardiologist in 2012 with mild concentric LVH, mild diastolic dysfunction, calcification of the mitral annulus, and mild AS/AI.  Further recommendations pending repeat echo.  History of mild CAD, 2010 LHC --No signs or symptoms of angina as reported per nursing staff given patient's dementia.  Previous 2010 cath with mild CAD.  Previous MPI in 2016 as above and without significant ischemia but ruled high risk 2/2 reduced EF.  Most recent EKG with LVH and repolarization abnormalities and consistent with that of previous tracings.  Will update echo as above to reassess EF and wall motion.  Further recommendations for ischemic workup, if needed, pending echocardiogram to assess EF and rule out WMA. Continue risk factor modification, including statin therapy and aggressive glycemic control.  Smoking cessation advised.  Lifestyle activity and dietary recommendations reviewed today.  Chronic HFrEF -- Denies any signs or symptoms of worsening heart failure with consideration of dementia.  Relatively euvolemic on exam with chronic dependent edema.  Wt decreased from previous visit. BP unchanged from previous visits. 2016 MPI as above with significantly reduced EF, ultimately resulting in high risk study 2/2 reduced EF.  2012 echo with LV SF normal and mild diastolic dysfunction.  Per EMR, no recent echocardiogram since 2012.  Given harsh 2/6 systolic murmur, update echo as above.  Current GDMT limited by renal function/ARF.  Most recent labs as copied from ALF paperwork as above.  Avoid diuretics/ACE/ARB/ARNI given ARF.and renally dose medications.  Agree with plan to establish with [facility].  ARF --Currently pending first visit with [facility] as above. Avoid diuretics/ACE/ARBs. Renally dose medications if indicated.  Discussed possible future scan to rule out renal artery stenosis or as recommended by  nephrology.  Anemia, chronic, likely of chronic dz --Possibly secondary to CKD/anemia of chronic disease.  No signs or symptoms reported of acute bleeding.  Most recent hemoglobin and hematocrit as above and low but stable when compared with with previous labs.  Continue to monitor.  Further recommendations regarding EPO as per nephrology.  HLD -Previous total cholesterol 170 and LDL Calc 97 in 2019.  Per review of recent ALF paperwork, unable to find recent lipid panel.  Recommend recheck of lipids and liver function at RTC with transition to high risk statin therapy pending these results for aggressive risk factor modification.  For now, continue current simvastatin 40 mg daily.  Smoking cessation advised.  Bilateral carotid artery disease --No symptoms of worsening disease with consideration of patient dementia and thus limited ROS.  Mild bilateral bruit heard today on exam and with consideration of harsh murmur as above.  Continue ASA 81 mg and statin therapy.  Pending future lipid panel, consider escalation to atorvastatin 80 mg if indicated/tolerated.  Smoking cessation advised. Reassess at RTC.  Current tobacco use --Previously reported smoking 3 cigarettes or less per day.  Reports smoking 3 cigarettes daily today.  Continued efforts for complete smoking cessation recommended.   Medication changes: Increase clonidine 0.2 mg twice daily to clonidine 0.2 mg 3 times daily.  Increase hydralazine to 50 mg 3 times daily and pending clarification by primary cardiologist as detailed in HPI and A/P above.  Also, further recommendations pending ALF RN BP daily updates which will be faxed from ALF in approximately 2 weeks from today.  Labs ordered: None. See recent ALF  labs above. Studies / Imaging ordered: Echocardiogram.   Future considerations: Consider future RAS scan.  Recommend repeat lipid panel and transition to atorvastatin 80 mg daily if indicated. Further recommendations pending  echo. Disposition: RTC 3 months or sooner pending echo and updated BP results   Arvil Chaco, PA-C 11/29/2019

## 2019-11-29 ENCOUNTER — Encounter: Payer: Self-pay | Admitting: Physician Assistant

## 2019-11-29 ENCOUNTER — Ambulatory Visit (INDEPENDENT_AMBULATORY_CARE_PROVIDER_SITE_OTHER): Payer: Medicare Other | Admitting: Physician Assistant

## 2019-11-29 ENCOUNTER — Other Ambulatory Visit: Payer: Self-pay

## 2019-11-29 VITALS — BP 172/60 | HR 57 | Ht 60.0 in | Wt 121.5 lb

## 2019-11-29 DIAGNOSIS — R011 Cardiac murmur, unspecified: Secondary | ICD-10-CM

## 2019-11-29 DIAGNOSIS — I5020 Unspecified systolic (congestive) heart failure: Secondary | ICD-10-CM | POA: Diagnosis not present

## 2019-11-29 DIAGNOSIS — E1122 Type 2 diabetes mellitus with diabetic chronic kidney disease: Secondary | ICD-10-CM

## 2019-11-29 DIAGNOSIS — F17200 Nicotine dependence, unspecified, uncomplicated: Secondary | ICD-10-CM

## 2019-11-29 DIAGNOSIS — I10 Essential (primary) hypertension: Secondary | ICD-10-CM

## 2019-11-29 DIAGNOSIS — I2510 Atherosclerotic heart disease of native coronary artery without angina pectoris: Secondary | ICD-10-CM | POA: Diagnosis not present

## 2019-11-29 DIAGNOSIS — G309 Alzheimer's disease, unspecified: Secondary | ICD-10-CM

## 2019-11-29 DIAGNOSIS — N183 Chronic kidney disease, stage 3 unspecified: Secondary | ICD-10-CM

## 2019-11-29 DIAGNOSIS — I119 Hypertensive heart disease without heart failure: Secondary | ICD-10-CM

## 2019-11-29 DIAGNOSIS — E876 Hypokalemia: Secondary | ICD-10-CM

## 2019-11-29 DIAGNOSIS — I779 Disorder of arteries and arterioles, unspecified: Secondary | ICD-10-CM

## 2019-11-29 DIAGNOSIS — I43 Cardiomyopathy in diseases classified elsewhere: Secondary | ICD-10-CM

## 2019-11-29 DIAGNOSIS — Z794 Long term (current) use of insulin: Secondary | ICD-10-CM

## 2019-11-29 DIAGNOSIS — E785 Hyperlipidemia, unspecified: Secondary | ICD-10-CM

## 2019-11-29 DIAGNOSIS — F0280 Dementia in other diseases classified elsewhere without behavioral disturbance: Secondary | ICD-10-CM

## 2019-11-29 MED ORDER — HYDRALAZINE HCL 50 MG PO TABS: 50.0000 mg | ORAL_TABLET | Freq: Three times a day (TID) | ORAL | 6 refills | Status: DC

## 2019-11-29 MED ORDER — CLONIDINE HCL 0.2 MG PO TABS: 0.2000 mg | ORAL_TABLET | Freq: Three times a day (TID) | ORAL | 6 refills | Status: DC

## 2019-11-29 NOTE — Patient Instructions (Addendum)
Medication Instructions:  - Your physician has recommended you make the following change in your medication:   1) INCREASE hydralazine 50 mg- take 1 tablet (50 mg) by mouth three times daily   2) INCREASE clonidine 0.2 mg- take 1 tablet (0.2 mg) by mouth three times daily   *If you need a refill on your cardiac medications before your next appointment, please call your pharmacy*   Lab Work: - none ordered  If you have labs (blood work) drawn today and your tests are completely normal, you will receive your results only by: Marland Kitchen MyChart Message (if you have MyChart) OR . A paper copy in the mail If you have any lab test that is abnormal or we need to change your treatment, we will call you to review the results.   Testing/Procedures: - Your physician has requested that you have an echocardiogram. Echocardiography is a painless test that uses sound waves to create images of your heart. It provides your doctor with information about the size and shape of your heart and how well your heart's chambers and valves are working. This procedure takes approximately one hour. There are no restrictions for this procedure. We do occasionally have to start an IV on some patients during the test. We do not know this until you are here and the technician starts your test. If they cannot get really good images of the heart, we may need to inject an image enhancing agent through the IV, so please drink some water prior to coming.   Follow-Up: At [facility], you and your health needs are our priority.  As part of our continuing mission to provide you with exceptional heart care, we have created designated Provider Care Teams.  These Care Teams include your primary Cardiologist (physician) and Advanced Practice Providers (APPs -  Physician Assistants and Nurse Practitioners) who all work together to provide you with the care you need, when you need it.  We recommend signing up for the patient portal called  "MyChart".  Sign up information is provided on this After Visit Summary.  MyChart is used to connect with patients for Virtual Visits (Telemedicine).  Patients are able to view lab/test results, encounter notes, upcoming appointments, etc.  Non-urgent messages can be sent to your provider as well.   To learn more about what you can do with MyChart, go to [email or web address].    Your next appointment:   3 month(s)  The format for your next appointment:   In Person  Provider:    You may see Ida Rogue, MD or one of the following Advanced Practice Providers on your designated Care Team:    Murray Hodgkins, NP  Christell Faith, PA-C  Marrianne Mood, PA-C    Other Instructions Please check blood pressure once daily (an order has been provided for this).

## 2019-12-02 DIAGNOSIS — D631 Anemia in chronic kidney disease: Secondary | ICD-10-CM | POA: Insufficient documentation

## 2019-12-02 DIAGNOSIS — I129 Hypertensive chronic kidney disease with stage 1 through stage 4 chronic kidney disease, or unspecified chronic kidney disease: Secondary | ICD-10-CM | POA: Insufficient documentation

## 2019-12-02 DIAGNOSIS — R809 Proteinuria, unspecified: Secondary | ICD-10-CM | POA: Insufficient documentation

## 2019-12-03 ENCOUNTER — Other Ambulatory Visit: Payer: Self-pay | Admitting: Nephrology

## 2019-12-03 DIAGNOSIS — I129 Hypertensive chronic kidney disease with stage 1 through stage 4 chronic kidney disease, or unspecified chronic kidney disease: Secondary | ICD-10-CM

## 2019-12-03 DIAGNOSIS — E1122 Type 2 diabetes mellitus with diabetic chronic kidney disease: Secondary | ICD-10-CM

## 2019-12-03 DIAGNOSIS — N189 Chronic kidney disease, unspecified: Secondary | ICD-10-CM

## 2019-12-03 DIAGNOSIS — N184 Chronic kidney disease, stage 4 (severe): Secondary | ICD-10-CM

## 2019-12-03 DIAGNOSIS — D631 Anemia in chronic kidney disease: Secondary | ICD-10-CM

## 2019-12-04 NOTE — Progress Notes (Signed)
Depending on pressure when you saw her, Would go to at least 3x a day if not QID if BP elevated

## 2019-12-08 ENCOUNTER — Ambulatory Visit: Payer: Medicare Other

## 2019-12-13 ENCOUNTER — Other Ambulatory Visit: Payer: Self-pay

## 2019-12-13 ENCOUNTER — Ambulatory Visit
Admission: RE | Admit: 2019-12-13 | Discharge: 2019-12-13 | Disposition: A | Discharge status: Home / Self Care | Payer: Medicare Other | Source: Ambulatory Visit | Attending: Nephrology | Admitting: Nephrology

## 2019-12-13 DIAGNOSIS — E1122 Type 2 diabetes mellitus with diabetic chronic kidney disease: Secondary | ICD-10-CM | POA: Diagnosis not present

## 2019-12-13 DIAGNOSIS — N184 Chronic kidney disease, stage 4 (severe): Secondary | ICD-10-CM | POA: Diagnosis present

## 2019-12-13 DIAGNOSIS — D631 Anemia in chronic kidney disease: Secondary | ICD-10-CM | POA: Diagnosis present

## 2019-12-13 DIAGNOSIS — N189 Chronic kidney disease, unspecified: Secondary | ICD-10-CM | POA: Diagnosis present

## 2019-12-13 DIAGNOSIS — I129 Hypertensive chronic kidney disease with stage 1 through stage 4 chronic kidney disease, or unspecified chronic kidney disease: Secondary | ICD-10-CM | POA: Insufficient documentation

## 2020-01-11 ENCOUNTER — Other Ambulatory Visit: Payer: Medicare Other

## 2020-01-26 ENCOUNTER — Other Ambulatory Visit: Payer: Self-pay

## 2020-01-26 ENCOUNTER — Ambulatory Visit (INDEPENDENT_AMBULATORY_CARE_PROVIDER_SITE_OTHER): Payer: Medicare Other

## 2020-01-26 DIAGNOSIS — R011 Cardiac murmur, unspecified: Secondary | ICD-10-CM

## 2020-01-28 ENCOUNTER — Telehealth: Payer: Self-pay

## 2020-01-28 NOTE — Telephone Encounter (Signed)
-----   Message from Loel Dubonnet, NP sent at 01/28/2020 12:29 PM EDT ----- Echocardiogram shows normal pumping function and mild stiffening of the heart. This mild stiffening is very common. She had mild stiffening of her aortic valve, which can be monitored. Her heart muscle was severely thickened, likely due to her uncontrolled HTN, and we will need to focus on BP control for treatment.   Please ask her caregiver to send report of BP so we can adjust medications if needed.

## 2020-01-28 NOTE — Telephone Encounter (Signed)
Call to patient to review echo results.    Spoke to caregiver who verbalized understanding and has no further questions at this time.    Advised pt to call for any further questions or concerns.  No further orders.    They will call mid next week and update on BP.

## 2020-02-08 DIAGNOSIS — N2581 Secondary hyperparathyroidism of renal origin: Secondary | ICD-10-CM | POA: Insufficient documentation

## 2020-03-07 ENCOUNTER — Ambulatory Visit: Payer: Medicare Other | Admitting: Cardiovascular Disease

## 2020-03-23 ENCOUNTER — Telehealth (INDEPENDENT_AMBULATORY_CARE_PROVIDER_SITE_OTHER): Payer: Self-pay

## 2020-03-23 NOTE — Telephone Encounter (Signed)
A fax was received from [facility] for the patient to have a permcath insertion. Spoke with Richarda Blade at [facility] and the patient is scheduled on 03/28/20 with a 11:00 am arrival time to the [facility] with Dr. Delana Meyer. Covid testing is on 03/24/20 between 8-1 pm at the [facility]. Pre-procedure instructions will be faxed to [facility].

## 2020-03-24 ENCOUNTER — Other Ambulatory Visit
Admission: RE | Admit: 2020-03-24 | Discharge: 2020-03-24 | Disposition: A | Discharge status: Home / Self Care | Payer: Medicare Other | Source: Ambulatory Visit | Attending: Vascular Surgery | Admitting: Vascular Surgery

## 2020-03-24 ENCOUNTER — Other Ambulatory Visit: Payer: Self-pay

## 2020-03-24 DIAGNOSIS — Z20822 Contact with and (suspected) exposure to covid-19: Secondary | ICD-10-CM | POA: Insufficient documentation

## 2020-03-24 DIAGNOSIS — Z01812 Encounter for preprocedural laboratory examination: Secondary | ICD-10-CM | POA: Diagnosis present

## 2020-03-24 LAB — SARS CORONAVIRUS 2 (TAT 6-24 HRS): SARS Coronavirus 2: NEGATIVE

## 2020-03-27 ENCOUNTER — Other Ambulatory Visit (INDEPENDENT_AMBULATORY_CARE_PROVIDER_SITE_OTHER): Payer: Self-pay | Admitting: Nurse Practitioner

## 2020-03-28 ENCOUNTER — Ambulatory Visit
Admission: RE | Admit: 2020-03-28 | Discharge: 2020-03-28 | Disposition: A | Discharge status: Skilled Nursing Facility | Payer: Medicare Other | Attending: Vascular Surgery | Admitting: Vascular Surgery

## 2020-03-28 ENCOUNTER — Other Ambulatory Visit: Payer: Self-pay

## 2020-03-28 ENCOUNTER — Encounter: Payer: Self-pay | Admitting: Vascular Surgery

## 2020-03-28 ENCOUNTER — Encounter
Admission: RE | Disposition: A | Discharge status: Skilled Nursing Facility | Payer: Self-pay | Source: Home / Self Care | Attending: Vascular Surgery

## 2020-03-28 DIAGNOSIS — Z9049 Acquired absence of other specified parts of digestive tract: Secondary | ICD-10-CM | POA: Insufficient documentation

## 2020-03-28 DIAGNOSIS — N185 Chronic kidney disease, stage 5: Secondary | ICD-10-CM

## 2020-03-28 DIAGNOSIS — Z833 Family history of diabetes mellitus: Secondary | ICD-10-CM | POA: Insufficient documentation

## 2020-03-28 DIAGNOSIS — I132 Hypertensive heart and chronic kidney disease with heart failure and with stage 5 chronic kidney disease, or end stage renal disease: Secondary | ICD-10-CM | POA: Diagnosis not present

## 2020-03-28 DIAGNOSIS — E1122 Type 2 diabetes mellitus with diabetic chronic kidney disease: Secondary | ICD-10-CM | POA: Diagnosis not present

## 2020-03-28 DIAGNOSIS — F0280 Dementia in other diseases classified elsewhere without behavioral disturbance: Secondary | ICD-10-CM | POA: Insufficient documentation

## 2020-03-28 DIAGNOSIS — I2510 Atherosclerotic heart disease of native coronary artery without angina pectoris: Secondary | ICD-10-CM | POA: Insufficient documentation

## 2020-03-28 DIAGNOSIS — Z90710 Acquired absence of both cervix and uterus: Secondary | ICD-10-CM | POA: Insufficient documentation

## 2020-03-28 DIAGNOSIS — Z8249 Family history of ischemic heart disease and other diseases of the circulatory system: Secondary | ICD-10-CM | POA: Diagnosis not present

## 2020-03-28 DIAGNOSIS — F17210 Nicotine dependence, cigarettes, uncomplicated: Secondary | ICD-10-CM | POA: Diagnosis not present

## 2020-03-28 DIAGNOSIS — M1990 Unspecified osteoarthritis, unspecified site: Secondary | ICD-10-CM | POA: Insufficient documentation

## 2020-03-28 DIAGNOSIS — N189 Chronic kidney disease, unspecified: Secondary | ICD-10-CM

## 2020-03-28 DIAGNOSIS — G309 Alzheimer's disease, unspecified: Secondary | ICD-10-CM | POA: Insufficient documentation

## 2020-03-28 DIAGNOSIS — N186 End stage renal disease: Secondary | ICD-10-CM | POA: Diagnosis present

## 2020-03-28 DIAGNOSIS — I5022 Chronic systolic (congestive) heart failure: Secondary | ICD-10-CM | POA: Diagnosis not present

## 2020-03-28 DIAGNOSIS — E785 Hyperlipidemia, unspecified: Secondary | ICD-10-CM | POA: Diagnosis not present

## 2020-03-28 DIAGNOSIS — I428 Other cardiomyopathies: Secondary | ICD-10-CM | POA: Diagnosis not present

## 2020-03-28 HISTORY — PX: DIALYSIS/PERMA CATHETER INSERTION: CATH118288

## 2020-03-28 LAB — POTASSIUM (ARMC VASCULAR LAB ONLY): Potassium (ARMC vascular lab): 4.4 (ref 3.5–5.1)

## 2020-03-28 LAB — GLUCOSE, CAPILLARY: Glucose-Capillary: 119 mg/dL — ABNORMAL HIGH (ref 70–99)

## 2020-03-28 SURGERY — DIALYSIS/PERMA CATHETER INSERTION
Anesthesia: Moderate Sedation

## 2020-03-28 MED ORDER — METHYLPREDNISOLONE SODIUM SUCC 125 MG IJ SOLR: 125.0000 mg | Freq: Once | INTRAMUSCULAR | Status: DC | PRN

## 2020-03-28 MED ORDER — MIDAZOLAM HCL 2 MG/ML PO SYRP: 8.0000 mg | ORAL_SOLUTION | Freq: Once | ORAL | Status: DC | PRN

## 2020-03-28 MED ORDER — CHLORHEXIDINE GLUCONATE CLOTH 2 % EX PADS
6.0000 | MEDICATED_PAD | Freq: Every day | CUTANEOUS | Status: DC
  Administered 2020-03-28: 2 via TOPICAL

## 2020-03-28 MED ORDER — MIDAZOLAM HCL 5 MG/5ML IJ SOLN
INTRAMUSCULAR | Status: AC
  Filled 2020-03-28: qty 5

## 2020-03-28 MED ORDER — FAMOTIDINE 20 MG PO TABS: 40.0000 mg | ORAL_TABLET | Freq: Once | ORAL | Status: DC | PRN

## 2020-03-28 MED ORDER — CEFAZOLIN SODIUM-DEXTROSE 1-4 GM/50ML-% IV SOLN
INTRAVENOUS | Status: AC
  Administered 2020-03-28: 1 g via INTRAVENOUS
  Filled 2020-03-28: qty 50

## 2020-03-28 MED ORDER — SODIUM CHLORIDE 0.9 % IV SOLN: INTRAVENOUS | Status: DC

## 2020-03-28 MED ORDER — HYDROMORPHONE HCL 1 MG/ML IJ SOLN: 1.0000 mg | Freq: Once | INTRAMUSCULAR | Status: DC | PRN

## 2020-03-28 MED ORDER — FENTANYL CITRATE (PF) 100 MCG/2ML IJ SOLN
INTRAMUSCULAR | Status: DC | PRN
Start: 2020-03-28 — End: 2020-03-28
  Administered 2020-03-28 (×2): 25 ug via INTRAVENOUS

## 2020-03-28 MED ORDER — HEPARIN SODIUM (PORCINE) 10000 UNIT/ML IJ SOLN
INTRAMUSCULAR | Status: AC
  Filled 2020-03-28: qty 1

## 2020-03-28 MED ORDER — MIDAZOLAM HCL 2 MG/2ML IJ SOLN
INTRAMUSCULAR | Status: DC | PRN
  Administered 2020-03-28: 0.5 mg via INTRAVENOUS
  Administered 2020-03-28: 1 mg via INTRAVENOUS

## 2020-03-28 MED ORDER — DIPHENHYDRAMINE HCL 50 MG/ML IJ SOLN: 50.0000 mg | Freq: Once | INTRAMUSCULAR | Status: DC | PRN

## 2020-03-28 MED ORDER — ONDANSETRON HCL 4 MG/2ML IJ SOLN: 4.0000 mg | Freq: Four times a day (QID) | INTRAMUSCULAR | Status: DC | PRN

## 2020-03-28 MED ORDER — CEFAZOLIN SODIUM-DEXTROSE 1-4 GM/50ML-% IV SOLN: 1.0000 g | Freq: Once | INTRAVENOUS | Status: AC

## 2020-03-28 MED ORDER — FENTANYL CITRATE (PF) 100 MCG/2ML IJ SOLN
INTRAMUSCULAR | Status: AC
  Filled 2020-03-28: qty 2

## 2020-03-28 SURGICAL SUPPLY — 8 items
CATH CANNON HEMO 15FR 19 (HEMODIALYSIS SUPPLIES) ×3 IMPLANT
COVER PROBE U/S 5X48 (MISCELLANEOUS) ×3 IMPLANT
DRAPE INCISE IOBAN 66X45 STRL (DRAPES) ×3 IMPLANT
NEEDLE ENTRY 21GA 7CM ECHOTIP (NEEDLE) ×3 IMPLANT
PACK ANGIOGRAPHY (CUSTOM PROCEDURE TRAY) ×3 IMPLANT
SET INTRO CAPELLA COAXIAL (SET/KITS/TRAYS/PACK) ×3 IMPLANT
SUT MNCRL AB 4-0 PS2 18 (SUTURE) ×3 IMPLANT
SUT SILK 0 FSL (SUTURE) ×3 IMPLANT

## 2020-03-28 NOTE — Discharge Instructions (Signed)
Moderate Conscious Sedation, Adult, Care After These instructions provide you with information about caring for yourself after your procedure. Your health care provider may also give you more specific instructions. Your treatment has been planned according to current medical practices, but problems sometimes occur. Call your health care provider if you have any problems or questions after your procedure. What can I expect after the procedure? After your procedure, it is common:  To feel sleepy for several hours.  To feel clumsy and have poor balance for several hours.  To have poor judgment for several hours.  To vomit if you eat too soon. Follow these instructions at home: For at least 24 hours after the procedure:   Do not: ? Participate in activities where you could fall or become injured. ? Drive. ? Use heavy machinery. ? Drink alcohol. ? Take sleeping pills or medicines that cause drowsiness. ? Make important decisions or sign legal documents. ? Take care of children on your own.  Rest. Eating and drinking  Follow the diet recommended by your health care provider.  If you vomit: ? Drink water, juice, or soup when you can drink without vomiting. ? Make sure you have little or no nausea before eating solid foods. General instructions  Have a responsible adult stay with you until you are awake and alert.  Take over-the-counter and prescription medicines only as told by your health care provider.  If you smoke, do not smoke without supervision.  Keep all follow-up visits as told by your health care provider. This is important. Contact a health care provider if:  You keep feeling nauseous or you keep vomiting.  You feel light-headed.  You develop a rash.  You have a fever. Get help right away if:  You have trouble breathing. This information is not intended to replace advice given to you by your health care provider. Make sure you discuss any questions you have  with your health care provider. Document Revised: 06/27/2017 Document Reviewed: 11/04/2015 Elsevier Patient Education  2020 [employer] Tunneled Catheter Insertion, Care After This sheet gives you information about how to care for yourself after your procedure. Your health care provider may also give you more specific instructions. If you have problems or questions, contact your health care provider. What can I expect after the procedure? After the procedure, it is common to have:  Some mild redness, bruising, swelling, and pain around your catheter site.  A small amount of blood or clear fluid coming from your incisions. Follow these instructions at home: Incision care   Follow instructions from your health care provider about how to take care of your incisions. Make sure you: ? Wash your hands with soap and water before and after you change your bandages (dressings). If soap and water are not available, use hand sanitizer. ? Change your dressings as told by your health care provider. Wash the area around your incisions with a germ-killing (antiseptic) solution when you change your dressings. ? Leave stitches (sutures), skin glue, or adhesive strips in place. These skin closures may need to stay in place for 2 weeks or longer. If adhesive strip edges start to loosen and curl up, you may trim the loose edges. Do not remove adhesive strips completely unless your health care provider tells you to do that.  Keep your dressings clean and dry.  Check your incision areas every day for signs of infection. Check for: ? More redness, swelling, or pain. ? More fluid or blood. ? Warmth. ?   Pus or a bad smell. Catheter care   Wash your hands with soap and water before and after caring for your catheter. If soap and water are not available, use hand sanitizer.  Keep your catheter site clean and dry.  Apply an antibiotic ointment to your catheter site as told by your health care  provider.  Flush your catheter as told by your health care provider. This helps prevent it from becoming clogged.  Do not open the caps on the ends of the catheter.  Do not pull on your catheter. Medicines  Take over-the-counter and prescription medicines only as told by your health care provider.  If you were prescribed an antibiotic medicine, take it as told by your health care provider. Do not stop taking the antibiotic even if you start to feel better. Activity  Return to your normal activities as told by your health care provider. Ask your health care provider what activities are safe for you.  Follow any other activity restrictions as instructed by your health care provider.  Do not lift anything that is heavier than 10 lb (4.5 kg), or the limit that you are told, until your health care provider says that it is safe. Driving  Do not drive until your health care provider approves.  Ask your health care provider if the medicine prescribed to you requires you to avoid driving or using heavy machinery. General instructions  Follow your health care provider's specific instructions for the type of catheter that you have.  Do not take baths, swim, or use a hot tub until your health care provider approves. Ask your health care provider if you may take showers.  Keep all follow-up visits as told by your health care provider. This is important. Contact a health care provider if:  You feel unusually weak or nauseous.  You have more redness, swelling, or pain at your incisions or around the area where your catheter is inserted.  Your catheter is not working properly.  You are unable to flush your catheter. Get help right away if:  Your catheter develops a hole or it breaks.  You have pain or swelling when fluids or medicines are being given through the catheter.  Fluid is leaking from the catheter, under the dressing, or around the dressing.  Your catheter comes loose or  gets pulled completely out. If this happens, press on your catheter site firmly with a clean cloth until you can get medical help.  You have swelling in your shoulder, neck, chest, or face.  You have chest pain or difficulty breathing.  You feel dizzy or light-headed.  You have pus or a bad smell coming from your catheter site.  You have a fever or chills.  Your catheter site feels warm to the touch.  You develop bleeding from your catheter or your insertion site, and your bleeding does not stop. Summary  After the procedure, it is common to have mild redness, swelling, and pain around your catheter site.  Return to your normal activities as told by your health care provider. Ask your health care provider what activities are safe for you.  Follow your health care provider's specific instructions for the type of catheter that you have.  Keep your catheter site and your dressings clean and dry.  Contact a health care provider if your catheter is not working properly. Get help right away if you have chest pain, fever, or difficulty breathing. This information is not intended to replace advice given to you   by your health care provider. Make sure you discuss any questions you have with your health care provider. Document Revised: 07/07/2018 Document Reviewed: 07/07/2018 Elsevier Patient Education  2020 Elsevier Inc.  

## 2020-03-28 NOTE — Progress Notes (Signed)
[facility] Admission History & Physical  MRN : [id number]  Alyssa [name] is a 79 y.o. (28-Jul-1941) female who presents with chief complaint of scheduled permcath insertion.  History of Present Illness:  The patient is a 79 year old female with a past medical history of heart failure, GERD, coronary artery disease, hyperlipidemia, depression, osteoarthritis and chronic kidney disease stage V.   The patient has been followed by her nephrologist.  Patient with progressively worsening chronic kidney disease.  Her nephrologist would like to initiate dialysis at this time however she does not have an adequate dialysis access and therefore was referred to our practice for PermCath insertion.  Presents without complaint this AM.  Denies any fever, nausea vomiting.  Denies any shortness of breath or chest pain.  Current Facility-Administered Medications  Medication Dose Route Frequency Provider Last Rate Last Admin  . 0.9 %  sodium chloride infusion   Intravenous Continuous Eulogio Ditch E, NP      . ceFAZolin (ANCEF) 1-4 GM/50ML-% IVPB           . ceFAZolin (ANCEF) IVPB 1 g/50 mL premix  1 g Intravenous Once Kris Hartmann, NP      . Chlorhexidine Gluconate Cloth 2 % PADS 6 each  6 each Topical Q0600 Kris Hartmann, NP      . diphenhydrAMINE (BENADRYL) injection 50 mg  50 mg Intravenous Once PRN Kris Hartmann, NP      . famotidine (PEPCID) tablet 40 mg  40 mg Oral Once PRN Kris Hartmann, NP      . HYDROmorphone (DILAUDID) injection 1 mg  1 mg Intravenous Once PRN Eulogio Ditch E, NP      . methylPREDNISolone sodium succinate (SOLU-MEDROL) 125 mg/2 mL injection 125 mg  125 mg Intravenous Once PRN Eulogio Ditch E, NP      . midazolam (VERSED) 2 MG/ML syrup 8 mg  8 mg Oral Once PRN Kris Hartmann, NP      . ondansetron (ZOFRAN) injection 4 mg  4 mg Intravenous Q6H PRN Kris Hartmann, NP       Past Medical History:  Diagnosis Date  . Chronic systolic CHF  (congestive heart failure) ([place])   . CKD (chronic kidney disease), stage III   . Dementia ([place])   . Essential hypertension   . Hyperlipidemia   . Hypertensive Non-ischemic Cardiomyopathy    a. 2010 Echo: EF 55%;  b. 2010 borderline MV w/ apical thinning; c. 2010 Cath: LCX 50p->Med Rx;  d. 09/2014 Echo: EF 35-40% w/ Sev LVH and glob HK;  e. 09/2014 MV: EF 30-44%, small fixed apical defect (breast attenuation), no ischemia.  . Hypokalemia   . Type II diabetes mellitus ([place])    Past Surgical History:  Procedure Laterality Date  . ABDOMINAL HYSTERECTOMY    . CHOLECYSTECTOMY    . TONSILLECTOMY     Social History Social History   Tobacco Use  . Smoking status: Current Every Day Smoker    Packs/day: 0.25    Years: 10.00    Pack years: 2.50    Types: Cigarettes  . Smokeless tobacco: Former Systems developer    Types: Secondary school teacher  . Vaping Use: Never used  Substance Use Topics  . Alcohol use: Not Currently    Alcohol/week: 1.0 standard drink    Types: 1 Cans of beer per week  . Drug use: No   Family History Family History  Problem Relation Age of Onset  .  Diabetes Mother   . Hypertension Mother   . Diabetes Father   . Hypertension Father   . Breast cancer Neg Hx   Denies any family history of peripheral heart disease, venous disease or renal disease.  No Known Allergies  REVIEW OF SYSTEMS (Negative unless checked)  Constitutional: [] Weight loss  [] Fever  [] Chills Cardiac: [] Chest pain   [] Chest pressure   [] Palpitations   [] Shortness of breath when laying flat   [] Shortness of breath at rest   [] Shortness of breath with exertion. Vascular:  [] Pain in legs with walking   [] Pain in legs at rest   [] Pain in legs when laying flat   [] Claudication   [] Pain in feet when walking  [] Pain in feet at rest  [] Pain in feet when laying flat   [] History of DVT   [] Phlebitis   [] Swelling in legs   [] Varicose veins   [] Non-healing ulcers Pulmonary:   [] Uses home oxygen   [] Productive cough    [] Hemoptysis   [] Wheeze  [] COPD   [] Asthma Neurologic:  [] Dizziness  [] Blackouts   [] Seizures   [] History of stroke   [] History of TIA  [] Aphasia   [] Temporary blindness   [] Dysphagia   [] Weakness or numbness in arms   [] Weakness or numbness in legs Musculoskeletal:  [x] Arthritis   [] Joint swelling   [] Joint pain   [] Low back pain Hematologic:  [] Easy bruising  [] Easy bleeding   [] Hypercoagulable state   [] Anemic  [] Hepatitis Gastrointestinal:  [] Blood in stool   [] Vomiting blood  [x] Gastroesophageal reflux/heartburn   [] Difficulty swallowing. Genitourinary:  [x] Chronic kidney disease   [] Difficult urination  [] Frequent urination  [] Burning with urination   [] Blood in urine Skin:  [] Rashes   [] Ulcers   [] Wounds Psychological:  [] History of anxiety   []  History of major depression.  Physical Examination  There were no vitals filed for this visit. There is no height or weight on file to calculate BMI.  Gen: WD/WN, NAD Head: [place]/AT, No temporalis wasting.  Ear/Nose/Throat: Hearing grossly intact, nares w/o erythema or drainage, oropharynx w/o Erythema/Exudate, Eyes: Sclera non-icteric, conjunctiva clear Neck: Supple, no nuchal rigidity.  No JVD.  Pulmonary:  Good air movement, no increased work of respiration or use of accessory muscles  Cardiac: RRR, normal S1, S2, no Murmurs, rubs or gallops. Vascular:  Vessel Right Left  Radial Palpable Palpable  Ulnar Palpable Palpable  Brachial Palpable Palpable  Carotid Palpable, without bruit Palpable, without bruit  Aorta Not palpable N/A  Femoral Palpable Palpable  Popliteal Palpable Palpable  PT Palpable Palpable  DP Palpable Palpable   Gastrointestinal: soft, non-tender/non-distended. No guarding/reflex. No masses, surgical incisions, or scars. Musculoskeletal: M/S 5/5 throughout.  No deformity or atrophy. Minimal edema Neurologic: Sensation grossly intact in extremities.  Symmetrical.  Speech is fluent. Motor exam as listed  above. Psychiatric: Judgment intact, Mood & affect appropriate for pt's clinical situation. Dermatologic: No rashes or ulcers noted.  No cellulitis or open wounds. Lymph : No Cervical, Axillary, or Inguinal lymphadenopathy.  CBC Lab Results  Component Value Date   WBC 5.5 11/12/2017   HGB 10.2 (L) 11/12/2017   HCT 30.5 (L) 11/12/2017   MCV 87.7 11/12/2017   PLT 202 11/12/2017   BMET    Component Value Date/Time   NA 139 05/25/2018 1007   NA 138 10/28/2014 0105   K 4.9 05/25/2018 1007   K 3.7 10/28/2014 0105   CL 102 05/25/2018 1007   CL 106 10/28/2014 0105   CO2 21 05/25/2018 1007  CO2 26 10/28/2014 0105   GLUCOSE 70 05/25/2018 1007   GLUCOSE 129 (H) 01/21/2018 1019   GLUCOSE 212 (H) 10/28/2014 0105   BUN 17 05/25/2018 1007   BUN 15 10/28/2014 0105   CREATININE 1.82 (H) 05/25/2018 1007   CREATININE 1.12 (H) 10/28/2014 0105   CALCIUM 9.2 05/25/2018 1007   CALCIUM 8.3 (L) 10/28/2014 0105   GFRNONAA 27 (L) 05/25/2018 1007   GFRNONAA 49 (L) 10/28/2014 0105   GFRAA 31 (L) 05/25/2018 1007   GFRAA 57 (L) 10/28/2014 0105   CrCl cannot be calculated (Patient's most recent lab result is older than the maximum 21 days allowed.).  COAG Lab Results  Component Value Date   INR 0.95 03/05/2016   INR 1.0 10/27/2014   INR 1.0 10/24/2014   Radiology No results found.  Assessment/Plan The patient is a 79 year old female with a past medical history of heart failure, GERD, coronary artery disease, hyperlipidemia, depression, osteoarthritis and chronic kidney disease stage V.   1. 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  The patient's nephrologist would like to initiate dialysis after she does not have an adequate access at this time.  Recommend placement of a PermCath to allow the patient to dialyze in the inpatient outpatient setting.  Procedure, risks and benefits were explained to the patient's and her  POA as the patient has Alzheimer's and is a ward of the state.  Consent was obtained from [place].  2.  Hyperlipidemia On aspirin and statin for medical management Encouraged good control as its slows the progression of atherosclerotic disease  3.  Diabetes On appropriate medications Encouraged good control as its slows the progression of atherosclerotic disease  Discussed with Dr. Francene Castle, PA-C  03/28/2020 11:24 AM

## 2020-03-28 NOTE — Op Note (Addendum)
OPERATIVE NOTE   PROCEDURE: 1. Insertion of tunneled dialysis catheter right IJ approach with ultrasound and fluoroscopic guidance.  PRE-OPERATIVE DIAGNOSIS: Acute on chronic renal insufficiency  POST-OPERATIVE DIAGNOSIS: Same  SURGEON: [name].  ANESTHESIA: Conscious sedation was administered under my direct supervision by the interventional radiology RN. IV Versed plus fentanyl were utilized. Continuous ECG, pulse oximetry and blood pressure was monitored throughout the entire procedure. Conscious sedation was for a total of 25 minutes.  ESTIMATED BLOOD LOSS: Minimal cc  CONTRAST USED:  None  FLUOROSCOPY TIME: 0.4 minutes  INDICATIONS:   Alyssa [name] a 79 y.o. y.o. female who presents with worsening renal function she is now initiating on dialysis.  Therefore tunnel catheter was placed.  DESCRIPTION: After obtaining full informed written consent, the patient was positioned supine. The right neck and chest wall was prepped and draped in a sterile fashion. Ultrasound was placed in a sterile sleeve. Ultrasound was utilized to identify the right internal jugular vein which is noted to be echolucent and compressible indicating patency. Image is recorded for the permanent record. Under direct ultrasound visualization a micro-needle is inserted into the vein followed by the micro-wire. Micro-sheath was then advanced and a J wire is inserted without difficulty under fluoroscopic guidance. Small counterincision was made at the wire insertion site. Dilators are passed over the wire and the tunneled dialysis catheter is fed into the central venous system without difficulty.  Under fluoroscopy the catheter tip positioned at the atrial caval junction. The catheter is then approximated to the right chest wall and an exit site selected. 1% lidocaine is infiltrated in soft tissues at this level small incision is made and the tunneling device is then passed from the exit site to the right neck  counterincision. Catheter is then connected to the tunneling device and the catheter was pulled subcutaneously. It is then transected and the hub assembly connected without difficulty. Both lumens aspirate and flush easily. After verification of smooth contour with proper tip position under fluoroscopy the catheter is packed with 5000 units of heparin per lumen.  Catheter secured to the skin of the right chest wall with 0 silk. A sterile dressing is applied with a Biopatch.  COMPLICATIONS: None  CONDITION: Good  [name] [facility]. Office:  [phone]   03/28/2020,12:19 PM

## 2020-03-28 NOTE — Interval H&P Note (Signed)
History and Physical Interval Note:  03/28/2020 11:47 AM  Alyssa [name]  has presented today for surgery, with the diagnosis of Perma Cath Insertion   Chronic kidney disease  Covid  Aug 27.  The various methods of treatment have been discussed with the patient and family. After consideration of risks, benefits and other options for treatment, the patient has consented to  Procedure(s): DIALYSIS/PERMA CATHETER INSERTION (N/A) as a surgical intervention.  The patient's history has been reviewed, patient examined, no change in status, stable for surgery.  I have reviewed the patient's chart and labs.  Questions were answered to the patient's satisfaction.     [name]

## 2020-03-28 NOTE — H&P (View-Only) (Signed)
[facility] Admission History & Physical  MRN : [id number]  Alyssa [name] is a 79 y.o. (10-25-1940) female who presents with chief complaint of scheduled permcath insertion.  History of Present Illness:  The patient is a 79 year old female with a past medical history of heart failure, GERD, coronary artery disease, hyperlipidemia, depression, osteoarthritis and chronic kidney disease stage V.   The patient has been followed by her nephrologist.  Patient with progressively worsening chronic kidney disease.  Her nephrologist would like to initiate dialysis at this time however she does not have an adequate dialysis access and therefore was referred to our practice for PermCath insertion.  Presents without complaint this AM.  Denies any fever, nausea vomiting.  Denies any shortness of breath or chest pain.  Current Facility-Administered Medications  Medication Dose Route Frequency Provider Last Rate Last Admin  . 0.9 %  sodium chloride infusion   Intravenous Continuous Eulogio Ditch E, NP      . ceFAZolin (ANCEF) 1-4 GM/50ML-% IVPB           . ceFAZolin (ANCEF) IVPB 1 g/50 mL premix  1 g Intravenous Once Kris Hartmann, NP      . Chlorhexidine Gluconate Cloth 2 % PADS 6 each  6 each Topical Q0600 Kris Hartmann, NP      . diphenhydrAMINE (BENADRYL) injection 50 mg  50 mg Intravenous Once PRN Kris Hartmann, NP      . famotidine (PEPCID) tablet 40 mg  40 mg Oral Once PRN Kris Hartmann, NP      . HYDROmorphone (DILAUDID) injection 1 mg  1 mg Intravenous Once PRN Eulogio Ditch E, NP      . methylPREDNISolone sodium succinate (SOLU-MEDROL) 125 mg/2 mL injection 125 mg  125 mg Intravenous Once PRN Eulogio Ditch E, NP      . midazolam (VERSED) 2 MG/ML syrup 8 mg  8 mg Oral Once PRN Kris Hartmann, NP      . ondansetron (ZOFRAN) injection 4 mg  4 mg Intravenous Q6H PRN Kris Hartmann, NP       Past Medical History:  Diagnosis Date  . Chronic systolic CHF  (congestive heart failure) ([place])   . CKD (chronic kidney disease), stage III   . Dementia ([place])   . Essential hypertension   . Hyperlipidemia   . Hypertensive Non-ischemic Cardiomyopathy    a. 2010 Echo: EF 55%;  b. 2010 borderline MV w/ apical thinning; c. 2010 Cath: LCX 50p->Med Rx;  d. 09/2014 Echo: EF 35-40% w/ Sev LVH and glob HK;  e. 09/2014 MV: EF 30-44%, small fixed apical defect (breast attenuation), no ischemia.  . Hypokalemia   . Type II diabetes mellitus ([place])    Past Surgical History:  Procedure Laterality Date  . ABDOMINAL HYSTERECTOMY    . CHOLECYSTECTOMY    . TONSILLECTOMY     Social History Social History   Tobacco Use  . Smoking status: Current Every Day Smoker    Packs/day: 0.25    Years: 10.00    Pack years: 2.50    Types: Cigarettes  . Smokeless tobacco: Former Systems developer    Types: Secondary school teacher  . Vaping Use: Never used  Substance Use Topics  . Alcohol use: Not Currently    Alcohol/week: 1.0 standard drink    Types: 1 Cans of beer per week  . Drug use: No   Family History Family History  Problem Relation Age of Onset  .  Diabetes Mother   . Hypertension Mother   . Diabetes Father   . Hypertension Father   . Breast cancer Neg Hx   Denies any family history of peripheral heart disease, venous disease or renal disease.  No Known Allergies  REVIEW OF SYSTEMS (Negative unless checked)  Constitutional: [] Weight loss  [] Fever  [] Chills Cardiac: [] Chest pain   [] Chest pressure   [] Palpitations   [] Shortness of breath when laying flat   [] Shortness of breath at rest   [] Shortness of breath with exertion. Vascular:  [] Pain in legs with walking   [] Pain in legs at rest   [] Pain in legs when laying flat   [] Claudication   [] Pain in feet when walking  [] Pain in feet at rest  [] Pain in feet when laying flat   [] History of DVT   [] Phlebitis   [] Swelling in legs   [] Varicose veins   [] Non-healing ulcers Pulmonary:   [] Uses home oxygen   [] Productive cough    [] Hemoptysis   [] Wheeze  [] COPD   [] Asthma Neurologic:  [] Dizziness  [] Blackouts   [] Seizures   [] History of stroke   [] History of TIA  [] Aphasia   [] Temporary blindness   [] Dysphagia   [] Weakness or numbness in arms   [] Weakness or numbness in legs Musculoskeletal:  [x] Arthritis   [] Joint swelling   [] Joint pain   [] Low back pain Hematologic:  [] Easy bruising  [] Easy bleeding   [] Hypercoagulable state   [] Anemic  [] Hepatitis Gastrointestinal:  [] Blood in stool   [] Vomiting blood  [x] Gastroesophageal reflux/heartburn   [] Difficulty swallowing. Genitourinary:  [x] Chronic kidney disease   [] Difficult urination  [] Frequent urination  [] Burning with urination   [] Blood in urine Skin:  [] Rashes   [] Ulcers   [] Wounds Psychological:  [] History of anxiety   []  History of major depression.  Physical Examination  There were no vitals filed for this visit. There is no height or weight on file to calculate BMI.  Gen: WD/WN, NAD Head: [place]/AT, No temporalis wasting.  Ear/Nose/Throat: Hearing grossly intact, nares w/o erythema or drainage, oropharynx w/o Erythema/Exudate, Eyes: Sclera non-icteric, conjunctiva clear Neck: Supple, no nuchal rigidity.  No JVD.  Pulmonary:  Good air movement, no increased work of respiration or use of accessory muscles  Cardiac: RRR, normal S1, S2, no Murmurs, rubs or gallops. Vascular:  Vessel Right Left  Radial Palpable Palpable  Ulnar Palpable Palpable  Brachial Palpable Palpable  Carotid Palpable, without bruit Palpable, without bruit  Aorta Not palpable N/A  Femoral Palpable Palpable  Popliteal Palpable Palpable  PT Palpable Palpable  DP Palpable Palpable   Gastrointestinal: soft, non-tender/non-distended. No guarding/reflex. No masses, surgical incisions, or scars. Musculoskeletal: M/S 5/5 throughout.  No deformity or atrophy. Minimal edema Neurologic: Sensation grossly intact in extremities.  Symmetrical.  Speech is fluent. Motor exam as listed  above. Psychiatric: Judgment intact, Mood & affect appropriate for pt's clinical situation. Dermatologic: No rashes or ulcers noted.  No cellulitis or open wounds. Lymph : No Cervical, Axillary, or Inguinal lymphadenopathy.  CBC Lab Results  Component Value Date   WBC 5.5 11/12/2017   HGB 10.2 (L) 11/12/2017   HCT 30.5 (L) 11/12/2017   MCV 87.7 11/12/2017   PLT 202 11/12/2017   BMET    Component Value Date/Time   NA 139 05/25/2018 1007   NA 138 10/28/2014 0105   K 4.9 05/25/2018 1007   K 3.7 10/28/2014 0105   CL 102 05/25/2018 1007   CL 106 10/28/2014 0105   CO2 21 05/25/2018 1007  CO2 26 10/28/2014 0105   GLUCOSE 70 05/25/2018 1007   GLUCOSE 129 (H) 01/21/2018 1019   GLUCOSE 212 (H) 10/28/2014 0105   BUN 17 05/25/2018 1007   BUN 15 10/28/2014 0105   CREATININE 1.82 (H) 05/25/2018 1007   CREATININE 1.12 (H) 10/28/2014 0105   CALCIUM 9.2 05/25/2018 1007   CALCIUM 8.3 (L) 10/28/2014 0105   GFRNONAA 27 (L) 05/25/2018 1007   GFRNONAA 49 (L) 10/28/2014 0105   GFRAA 31 (L) 05/25/2018 1007   GFRAA 57 (L) 10/28/2014 0105   CrCl cannot be calculated (Patient's most recent lab result is older than the maximum 21 days allowed.).  COAG Lab Results  Component Value Date   INR 0.95 03/05/2016   INR 1.0 10/27/2014   INR 1.0 10/24/2014   Radiology No results found.  Assessment/Plan The patient is a 79 year old female with a past medical history of heart failure, GERD, coronary artery disease, hyperlipidemia, depression, osteoarthritis and chronic kidney disease stage V.   1. 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  The patient's nephrologist would like to initiate dialysis after she does not have an adequate access at this time.  Recommend placement of a PermCath to allow the patient to dialyze in the inpatient outpatient setting.  Procedure, risks and benefits were explained to the patient's and her  POA as the patient has Alzheimer's and is a ward of the state.  Consent was obtained from [place].  2.  Hyperlipidemia On aspirin and statin for medical management Encouraged good control as its slows the progression of atherosclerotic disease  3.  Diabetes On appropriate medications Encouraged good control as its slows the progression of atherosclerotic disease  Discussed with Dr. Francene Castle, PA-C  03/28/2020 11:24 AM

## 2020-04-04 ENCOUNTER — Ambulatory Visit
Admission: RE | Admit: 2020-04-04 | Discharge: 2020-04-04 | Disposition: A | Discharge status: Home / Self Care | Payer: Medicare Other | Attending: Nephrology | Admitting: Nephrology

## 2020-04-04 ENCOUNTER — Other Ambulatory Visit: Payer: Self-pay | Admitting: Nephrology

## 2020-04-04 ENCOUNTER — Other Ambulatory Visit: Payer: Self-pay

## 2020-04-04 ENCOUNTER — Ambulatory Visit
Admission: RE | Admit: 2020-04-04 | Discharge: 2020-04-04 | Disposition: A | Discharge status: Home / Self Care | Payer: Medicare Other | Source: Ambulatory Visit | Attending: Nephrology | Admitting: Nephrology

## 2020-04-04 DIAGNOSIS — N186 End stage renal disease: Secondary | ICD-10-CM

## 2020-04-04 DIAGNOSIS — Z992 Dependence on renal dialysis: Secondary | ICD-10-CM | POA: Diagnosis present

## 2020-04-07 ENCOUNTER — Ambulatory Visit: Payer: Medicare Other | Admitting: Family

## 2020-04-27 ENCOUNTER — Encounter: Payer: Self-pay | Admitting: Family

## 2020-04-27 ENCOUNTER — Other Ambulatory Visit: Payer: Self-pay

## 2020-04-27 ENCOUNTER — Ambulatory Visit (INDEPENDENT_AMBULATORY_CARE_PROVIDER_SITE_OTHER): Payer: Medicare Other | Admitting: Family

## 2020-04-27 VITALS — BP 140/62 | HR 56 | Ht 60.0 in | Wt 105.0 lb

## 2020-04-27 DIAGNOSIS — I2510 Atherosclerotic heart disease of native coronary artery without angina pectoris: Secondary | ICD-10-CM | POA: Diagnosis not present

## 2020-04-27 DIAGNOSIS — I132 Hypertensive heart and chronic kidney disease with heart failure and with stage 5 chronic kidney disease, or end stage renal disease: Secondary | ICD-10-CM

## 2020-04-27 DIAGNOSIS — I350 Nonrheumatic aortic (valve) stenosis: Secondary | ICD-10-CM | POA: Diagnosis not present

## 2020-04-27 DIAGNOSIS — I5020 Unspecified systolic (congestive) heart failure: Secondary | ICD-10-CM | POA: Diagnosis not present

## 2020-04-27 NOTE — Progress Notes (Signed)
Office Visit    Patient Name: Alyssa [name] Date of Encounter: 04/27/2020  Primary Care Provider:  [facility] Primary Cardiologist:  Ida Rogue, MD Electrophysiologist:  None   Chief Complaint    Alyssa [name] is a 79 y.o. female with a hx of CAD (2010 LHC with minimal coronary disease), HFrEF with an ICM/hypertensive heart disease, HTN, HLD, mild mild bilateral carotid disease, ESRD on HD, DM2, migraines, Alzheimer's dementia, insomnia presents today for follow-up of heart failure and hypertension  Past Medical History    Past Medical History:  Diagnosis Date   Chronic systolic CHF (congestive heart failure) ([place])    CKD (chronic kidney disease), stage III    Dementia ([place])    Essential hypertension    Hyperlipidemia    Hypertensive Non-ischemic Cardiomyopathy    a. 2010 Echo: EF 55%;  b. 2010 borderline MV w/ apical thinning; c. 2010 Cath: LCX 50p->Med Rx;  d. 09/2014 Echo: EF 35-40% w/ Sev LVH and glob HK;  e. 09/2014 MV: EF 30-44%, small fixed apical defect (breast attenuation), no ischemia.   Hypokalemia    Type II diabetes mellitus ([place])    Past Surgical History:  Procedure Laterality Date   ABDOMINAL HYSTERECTOMY     CHOLECYSTECTOMY     DIALYSIS/PERMA CATHETER INSERTION N/A 03/28/2020   Procedure: DIALYSIS/PERMA CATHETER INSERTION;  Surgeon: Katha Cabal, MD;  Location: [facility];  Service: Cardiovascular;  Laterality: N/A;   TONSILLECTOMY      Allergies  No Known Allergies  History of Present Illness    Alyssa [name] is a 79 y.o. female with a hx of f CAD (2010 LHC with minimal coronary disease), HFrEF with an ICM/hypertensive heart disease, HTN, HLD, mild mild bilateral carotid disease, ESRD on HD, DM2, migraines, Alzheimer's dementia, insomnia last seen 11/29/2019 by Elenor Quinones, PA.  Previous cardiac cath 2010 by Dr. Rockey Situ with 50% proximal circumflex lesion in calcium and proximal vessels.  Recommended for  medical management.  Echo 2010 with EF 55%.  Hospitalized March 2016 for hypertensive urgency and demand ischemia.  Lexiscan June 2016 high risk due to reduced EF 30-44%.  Small fixed apical defect presumed due to breast attenuation.  2016 echo EF 35-40% with severe LVH and global hypokinesis due to hypertensive heart disease.  2017 echo at outside cardiology group with mild concentric LVH, normal LV SF, mild diastolic dysfunction, mild AS/AI.  Seen in clinic 3 11/2019 for follow-up.  Her blood pressure continue to be difficult to control.  Adjustments were made to her antihypertensive regimen.  She was referred to nephrology.  Echocardiogram was recommended.  Echo 01/26/2020 LVEF 60 to 65%, no RWMA, severe concentric LVH, grade 1 diastolic dysfunction, mild aortic stenosis.  Present today with caregiver from [facility].  They check her blood pressure daily and blood pressures routinely less than 130/80.  She is on her second week of dialysis and her caregiver reports that she has seen significant improvement in her energy level.  The patient reports no chest pain, pressure, tightness.  She reports no shortness of breath at rest.  Reports stable dyspnea on exertion.  Denies edema, orthopnea, PND.  EKGs/Labs/Other Studies Reviewed:   The following studies were reviewed today:  Echo 01/26/2020  1. Left ventricular ejection fraction, by estimation, is 60 to 65%. The  left ventricle has normal function. The left ventricle has no regional  wall motion abnormalities. There is severe concentric left ventricular  hypertrophy.  Left ventricular diastolic   parameters are consistent with Grade I diastolic dysfunction (impaired  relaxation).   2. Right ventricular systolic function is normal. The right ventricular  size is normal.   3. Left atrial size was mildly dilated.   4. The mitral valve is normal in structure. No evidence of mitral valve  regurgitation.   5. The aortic  valve is tricuspid. Aortic valve regurgitation is mild.  Mild aortic valve stenosis.   6. The inferior vena cava is normal in size with greater than 50%  respiratory variability, suggesting right atrial pressure of 3 mmHg.   Bilateral Carotids 05/2017 IMPRESSION:  1. Slight calcific plaque formation is seen bilaterally.  2. No hemodynamically significant stenosis is observed on either side.  3. There is antegrade flow in both vertebrals.    MPI 12/2014  Downsloping ST segment depression ST segment depression was noted during stress.  T wave inversion was noted during stress.  This is a high risk study mainly due to decreased EF.  The left ventricular ejection fraction is moderately decreased (30-44%).  There is a small fixed apical defect which is likely due to breast attenuation.  No clear evidence of reversibility to suggest ischemia.   Outside Echo 11/24/10 INTERPRETATION MILD CONCENTRIC LVH NORMAL LV SYSTOLIC FUNCTION MILD DIASTOLIC DYSFUNCTION (IMPAIRED RELAXATION) CALCIFICATION OF THE MITRAL ANNULUS MILD AORTIC STENOSIS, MILD AORTIC INSUFFICIENCY  EKG:  EKG is ordered today.  The ekg ordered today demonstrates sinus bradycardia 56 bpm with no acute ST/T wave changes.  Recent Labs: No results found for requested labs within last 8760 hours.  Recent Lipid Panel    Component Value Date/Time   CHOL 170 05/25/2018 1007   CHOL 211 (H) 10/28/2014 0105   TRIG 135 05/25/2018 1007   TRIG 128 10/28/2014 0105   HDL 46 05/25/2018 1007   HDL 46 10/28/2014 0105   CHOLHDL 3.7 05/25/2018 1007   VLDL 26 10/28/2014 0105   LDLCALC 97 05/25/2018 1007   LDLCALC 139 (H) 10/28/2014 0105    Home Medications   Current Meds  Medication Sig   amLODipine (NORVASC) 10 MG tablet TAKE (1) TABLET BY MOUTH EVERY DAY (Patient taking differently: Take 10 mg by mouth daily. (0700))   aspirin EC 81 MG tablet Take 81 mg by mouth daily. (0700)   Blood Glucose Monitoring Suppl (TRUETRACK  BLOOD GLUCOSE) w/Device KIT 1 each by Does not apply route daily.   Calcium Carb-Cholecalciferol (CALCIUM 600+D3) 600-800 MG-UNIT TABS Take 1 tablet by mouth daily. (0700)   cloNIDine (CATAPRES) 0.1 MG tablet Take 0.1 mg by mouth daily as needed (SBP GREATER THAN OR EQUAL TO 160).   cloNIDine (CATAPRES) 0.2 MG tablet Take 1 tablet (0.2 mg total) by mouth 3 (three) times daily. (Patient taking differently: Take 0.2 mg by mouth 3 (three) times daily. (0700, 1400, 2000))   donepezil (ARICEPT) 10 MG tablet Take 10 mg by mouth at bedtime. (2000)   ferrous sulfate 325 (65 FE) MG tablet Take 325 mg by mouth daily with breakfast. (0700)   glipiZIDE (GLUCOTROL) 5 MG tablet Take 5 mg by mouth daily. (0700)   GLOBAL INSULIN SYRINGES 30G X 5/16" 0.3 ML MISC USE ONCE A DAY FOR INSULIN   glucose blood (RELION PRIME TEST) test strip Test twice a day   hydrALAZINE (APRESOLINE) 50 MG tablet Take 1 tablet (50 mg total) by mouth 3 (three) times daily. (Patient taking differently: Take 50 mg by mouth 3 (three) times daily. (0700, 1400, 2000))  LORazepam (ATIVAN) 0.5 MG tablet Take 0.25 mg by mouth 2 (two) times daily. (0700 & 2000)   LORazepam (ATIVAN) 0.5 MG tablet Take 0.25 mg by mouth daily as needed (control).   Melatonin 5 MG CAPS Take 2 capsules (10 mg total) by mouth at bedtime. (Patient taking differently: Take 5 mg by mouth at bedtime. (2000))   metoprolol succinate (TOPROL-XL) 100 MG 24 hr tablet Take 100 mg by mouth daily. (0700) Take with or immediately following a meal.   mirtazapine (REMERON) 15 MG tablet Take 1 tablet (15 mg total) by mouth at bedtime. (Patient taking differently: Take 15 mg by mouth at bedtime. (2000))   omeprazole (PRILOSEC) 40 MG capsule TAKE ONE CAPSULE TWICE A DAY (Patient taking differently: Take 40 mg by mouth in the morning and at bedtime. (0700 & 1700))   polyethylene glycol powder (GLYCOLAX/MIRALAX) powder Take 17 g by mouth 3 (three) times a week. (Patient  taking differently: Take 17 g by mouth 3 (three) times a week. (0700))   simvastatin (ZOCOR) 40 MG tablet TAKE ONE TABLET BY MOUTH AT BEDTIME (Patient taking differently: Take 40 mg by mouth at bedtime. (2000))   sodium bicarbonate 650 MG tablet Take 650 mg by mouth in the morning and at bedtime. (0700 & 2000)      Review of Systems    Review of Systems  Constitutional: Negative for chills, fever and malaise/fatigue.  Cardiovascular: Negative for chest pain, dyspnea on exertion, irregular heartbeat, leg swelling, near-syncope, orthopnea, palpitations and syncope.  Respiratory: Negative for cough, shortness of breath and wheezing.   Gastrointestinal: Negative for melena, nausea and vomiting.  Genitourinary: Negative for hematuria.  Neurological: Negative for dizziness, light-headedness and weakness.   All other systems reviewed and are otherwise negative except as noted above.  Physical Exam    VS:  BP 140/62 (BP Location: Left Arm, Patient Position: Sitting, Cuff Size: Normal)    Pulse (!) 56    Ht 5' (1.524 m)    Wt 105 lb (47.6 kg)    SpO2 95%    BMI 20.51 kg/m  , BMI Body mass index is 20.51 kg/m. GEN: Well nourished, well developed, in no acute distress. HEENT: normal. Neck: Supple, no JVD, carotid bruits, or masses. Cardiac: RRR, no murmurs, rubs, or gallops. No clubbing, cyanosis, edema.  Radials/DP/PT 2+ and equal bilaterally.  Respiratory:  Respirations regular and unlabored, clear to auscultation bilaterally. GI: Soft, nontender, nondistended, BS + x 4. MS: No deformity or atrophy. Skin: Warm and dry, no rash. Neuro:  Strength and sensation are intact. Psych: Normal affect.  Assessment & Plan    1. 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 Euvolemic and well compensated on exam today.  Her BP is well controlled at her facility with BP consistently at  goal of less than 130/80. Continue present antihypertensive regimen. Volume management per HD.   2. Mild AS - Stable finding by echo 12/2019. No lightheadedness, chest pain, dyspnea. Continue to monitor with periodic echo. Continue optimal Bp and HR control.   3. Bradycardia - HR 56 bpm today.  Asymptomatic with no lightheadedness, dizziness, near-syncope, syncope.  Given orders for her ALF to check her heart rate once per day with her blood pressure and report heart rate consistently less than 55 bpm.  If heart rate consistently less than 55 bpm, would recommend reduce dose of metoprolol.  As she is asymptomatic  we will defer any changes to her medications today.  4. CKD V on HD -caregiver reports improvement in energy level since starting dialysis 2 weeks ago.  Continue to follow with Dr. Juleen China of nephrology.  5. Anemia with CKD -continue to follow with Dr. Juleen China of nephrology for consideration of transfusion vs ESA.  Denies hematuria, melena.  6. CAD -mild nonobstructive by cardiac cath 2010.  Reports no anginal symptoms.  No indication for ischemic evaluation at this time.  Disposition: Follow up in 4 month(s) with Dr. Rockey Situ or APP.    Loel Dubonnet, NP 04/27/2020, 4:53 PM

## 2020-04-27 NOTE — Patient Instructions (Signed)
Medication Instructions:  No medication changes today.   *If you need a refill on your cardiac medications before your next appointment, please call your pharmacy*   Lab Work: No lab work today.   If you have labs (blood work) drawn today and your tests are completely normal, you will receive your results only by: Marland Kitchen MyChart Message (if you have MyChart) OR . A paper copy in the mail If you have any lab test that is abnormal or we need to change your treatment, we will call you to review the results.   Testing/Procedures: Your EKG today shows sinus bradycardia 56 bpm. This is a stable but slow heart rate.  Please check your heart rate once per day at home and keep a log. If you heart rate is consistently less than 55bpm please call our office or primary care and we will consider reducing the dose of the Metoprolol.    Follow-Up: At [facility], you and your health needs are our priority.  As part of our continuing mission to provide you with exceptional heart care, we have created designated Provider Care Teams.  These Care Teams include your primary Cardiologist (physician) and Advanced Practice Providers (APPs -  Physician Assistants and Nurse Practitioners) who all work together to provide you with the care you need, when you need it.  We recommend signing up for the patient portal called "MyChart".  Sign up information is provided on this After Visit Summary.  MyChart is used to connect with patients for Virtual Visits (Telemedicine).  Patients are able to view lab/test results, encounter notes, upcoming appointments, etc.  Non-urgent messages can be sent to your provider as well.   To learn more about what you can do with MyChart, go to [email or web address].    Your next appointment:   4 month(s)  The format for your next appointment:   In Person  Provider:   You may see Ida Rogue, MD or one of the following Advanced Practice Providers on your designated Care Team:    Murray Hodgkins, NP  Christell Faith, PA-C  Laurann Montana, NP  Marrianne Mood, PA-C  Cadence Kathlen Mody, [place]  Other Instructions  Call our office if your blood pressure is consistently more than 130/80.   Call our office if there is new lightheadedness, dizziness, chest pain, or shortness of breath.

## 2020-06-27 ENCOUNTER — Other Ambulatory Visit (INDEPENDENT_AMBULATORY_CARE_PROVIDER_SITE_OTHER): Payer: Self-pay | Admitting: Vascular Surgery

## 2020-06-27 DIAGNOSIS — N186 End stage renal disease: Secondary | ICD-10-CM

## 2020-06-29 ENCOUNTER — Ambulatory Visit (INDEPENDENT_AMBULATORY_CARE_PROVIDER_SITE_OTHER): Payer: Medicare Other

## 2020-06-29 ENCOUNTER — Encounter (INDEPENDENT_AMBULATORY_CARE_PROVIDER_SITE_OTHER): Payer: Self-pay | Admitting: Vascular Surgery

## 2020-06-29 ENCOUNTER — Other Ambulatory Visit: Payer: Self-pay

## 2020-06-29 ENCOUNTER — Ambulatory Visit (INDEPENDENT_AMBULATORY_CARE_PROVIDER_SITE_OTHER): Payer: Medicare Other | Admitting: Vascular Surgery

## 2020-06-29 VITALS — BP 221/77 | HR 63 | Resp 14 | Wt 99.8 lb

## 2020-06-29 DIAGNOSIS — T829XXS Unspecified complication of cardiac and vascular prosthetic device, implant and graft, sequela: Secondary | ICD-10-CM

## 2020-06-29 DIAGNOSIS — N186 End stage renal disease: Secondary | ICD-10-CM

## 2020-06-29 DIAGNOSIS — I10 Essential (primary) hypertension: Secondary | ICD-10-CM

## 2020-06-29 DIAGNOSIS — I2510 Atherosclerotic heart disease of native coronary artery without angina pectoris: Secondary | ICD-10-CM | POA: Diagnosis not present

## 2020-06-29 DIAGNOSIS — T829XXA Unspecified complication of cardiac and vascular prosthetic device, implant and graft, initial encounter: Secondary | ICD-10-CM | POA: Insufficient documentation

## 2020-06-29 NOTE — Progress Notes (Signed)
MRN : [id number]  Alyssa [name] is a 79 y.o. (May 07, 1941) female who presents with chief complaint of No chief complaint on file. Marland Kitchen  History of Present Illness:    The patient is seen for evaluation of dialysis access.  The patient has had a tunneled catheter placed on 03/28/2020   Current access is via a catheter which is functioning poorly.  There have not been any episodes of catheter infection.  The patient denies amaurosis fugax or recent TIA symptoms. There are no recent neurological changes noted. The patient denies claudication symptoms or rest pain symptoms. The patient denies history of DVT, PE or superficial thrombophlebitis. The patient denies recent episodes of angina or shortness of breath.   Duplex ultrasound for vein mapping demonstrates superficial veins of both upper extremities are small 2 mm or less and not adequate for fistula creation  No outpatient medications have been marked as taking for the 06/29/20 encounter (Appointment) with Delana Meyer, Dolores Lory, MD.    Past Medical History:  Diagnosis Date  . Chronic systolic CHF (congestive heart failure) ([place])   . CKD (chronic kidney disease), stage III   . Dementia ([place])   . Essential hypertension   . Hyperlipidemia   . Hypertensive Non-ischemic Cardiomyopathy    a. 2010 Echo: EF 55%;  b. 2010 borderline MV w/ apical thinning; c. 2010 Cath: LCX 50p->Med Rx;  d. 09/2014 Echo: EF 35-40% w/ Sev LVH and glob HK;  e. 09/2014 MV: EF 30-44%, small fixed apical defect (breast attenuation), no ischemia.  . Hypokalemia   . Type II diabetes mellitus ([place])     Past Surgical History:  Procedure Laterality Date  . ABDOMINAL HYSTERECTOMY    . CHOLECYSTECTOMY    . DIALYSIS/PERMA CATHETER INSERTION N/A 03/28/2020   Procedure: DIALYSIS/PERMA CATHETER INSERTION;  Surgeon: Katha Cabal, MD;  Location: [facility];  Service: Cardiovascular;  Laterality: N/A;  . TONSILLECTOMY      Social History Social History    Tobacco Use  . Smoking status: Current Every Day Smoker    Packs/day: 0.25    Years: 10.00    Pack years: 2.50    Types: Cigarettes  . Smokeless tobacco: Former Systems developer    Types: Secondary school teacher  . Vaping Use: Never used  Substance Use Topics  . Alcohol use: Not Currently    Alcohol/week: 1.0 standard drink    Types: 1 Cans of beer per week  . Drug use: No    Family History Family History  Problem Relation Age of Onset  . Diabetes Mother   . Hypertension Mother   . Diabetes Father   . Hypertension Father   . Breast cancer Neg Hx   No family history of bleeding/clotting disorders, porphyria or autoimmune disease   No Known Allergies   REVIEW OF SYSTEMS (Negative unless checked)  Constitutional: [] Weight loss  [] Fever  [] Chills Cardiac: [] Chest pain   [] Chest pressure   [] Palpitations   [] Shortness of breath when laying flat   [] Shortness of breath with exertion. Vascular:  [] Pain in legs with walking   [] Pain in legs at rest  [] History of DVT   [] Phlebitis   [] Swelling in legs   [] Varicose veins   [] Non-healing ulcers Pulmonary:   [] Uses home oxygen   [] Productive cough   [] Hemoptysis   [] Wheeze  [] COPD   [] Asthma Neurologic:  [] Dizziness   [] Seizures   [] History of stroke   [] History of TIA  [] Aphasia   [] Vissual changes   []   Weakness or numbness in arm   [] Weakness or numbness in leg Musculoskeletal:   [] Joint swelling   [] Joint pain   [] Low back pain Hematologic:  [] Easy bruising  [] Easy bleeding   [] Hypercoagulable state   [] Anemic Gastrointestinal:  [] Diarrhea   [] Vomiting  [] Gastroesophageal reflux/heartburn   [] Difficulty swallowing. Genitourinary:  [x] Chronic kidney disease   [] Difficult urination  [] Frequent urination   [] Blood in urine Skin:  [] Rashes   [] Ulcers  Psychological:  [] History of anxiety   []  History of major depression.  Physical Examination  There were no vitals filed for this visit. There is no height or weight on file to calculate BMI. Gen:  WD/WN, NAD Head: [place]/AT, No temporalis wasting.  Ear/Nose/Throat: Hearing grossly intact, nares w/o erythema or drainage, poor dentition Eyes: PER, EOMI, sclera nonicteric.  Neck: Supple, no masses.  No bruit or JVD.  Pulmonary:  Good air movement, clear to auscultation bilaterally, no use of accessory muscles.  Cardiac: RRR, normal S1, S2, no Murmurs. Vascular: No visible superficial veins bilateral upper extremities Vessel Right Left  Radial Palpable Palpable  Brachial Palpable Palpable   Gastrointestinal: soft, non-distended. No guarding/no peritoneal signs.  Musculoskeletal: M/S 5/5 throughout.  No deformity or atrophy.  Neurologic: CN 2-12 intact. Pain and light touch intact in extremities.  Symmetrical.  Speech is fluent. Motor exam as listed above. Psychiatric: Judgment intact, Mood & affect appropriate for pt's clinical situation. Dermatologic: No rashes or ulcers noted.  No changes consistent with cellulitis.  CBC Lab Results  Component Value Date   WBC 5.5 11/12/2017   HGB 10.2 (L) 11/12/2017   HCT 30.5 (L) 11/12/2017   MCV 87.7 11/12/2017   PLT 202 11/12/2017    BMET    Component Value Date/Time   NA 139 05/25/2018 1007   NA 138 10/28/2014 0105   K 4.9 05/25/2018 1007   K 3.7 10/28/2014 0105   CL 102 05/25/2018 1007   CL 106 10/28/2014 0105   CO2 21 05/25/2018 1007   CO2 26 10/28/2014 0105   GLUCOSE 70 05/25/2018 1007   GLUCOSE 129 (H) 01/21/2018 1019   GLUCOSE 212 (H) 10/28/2014 0105   BUN 17 05/25/2018 1007   BUN 15 10/28/2014 0105   CREATININE 1.82 (H) 05/25/2018 1007   CREATININE 1.12 (H) 10/28/2014 0105   CALCIUM 9.2 05/25/2018 1007   CALCIUM 8.3 (L) 10/28/2014 0105   GFRNONAA 27 (L) 05/25/2018 1007   GFRNONAA 49 (L) 10/28/2014 0105   GFRAA 31 (L) 05/25/2018 1007   GFRAA 57 (L) 10/28/2014 0105   CrCl cannot be calculated (Patient's most recent lab result is older than the maximum 21 days allowed.).  COAG Lab Results  Component Value Date   INR  0.95 03/05/2016   INR 1.0 10/27/2014   INR 1.0 10/24/2014    Radiology No results found.  Assessment/Plan 1. End stage renal disease ([place]) Recommend:  At this time the patient does not have appropriate extremity access for dialysis  Patient should have a left brachial axillary AV graft created.  The risks, benefits and alternative therapies were reviewed in detail with the patient.  All questions were answered.  The patient agrees to proceed with surgery.    2. Complication of vascular access for dialysis, sequela Catheter is functioning at the present time and therefore we will continue to use this as her access  3. Essential hypertension Continue antihypertensive medications as already ordered, these medications have been reviewed and there are no changes at this time.  4. Coronary artery disease involving native coronary artery of native heart without angina pectoris Continue cardiac and antihypertensive medications as already ordered and reviewed, no changes at this time.  Continue statin as ordered and reviewed, no changes at this time  Nitrates PRN for chest pain    [name], MD  06/29/2020 1:01 PM

## 2020-07-04 ENCOUNTER — Telehealth (INDEPENDENT_AMBULATORY_CARE_PROVIDER_SITE_OTHER): Payer: Self-pay

## 2020-07-04 NOTE — Telephone Encounter (Signed)
A message was left on the nurse's line from Janice Norrie regarding this patient and needing to speak with Eulogio Ditch NP or Dr. Delana Meyer about a permanent access for dialysis. The number to reach her is [phone]. Richea works for [employer] who has guardianship over the patient. The patient resides at [place] under [facility].

## 2020-07-07 ENCOUNTER — Encounter (INDEPENDENT_AMBULATORY_CARE_PROVIDER_SITE_OTHER): Payer: Self-pay | Admitting: Vascular Surgery

## 2020-07-17 NOTE — Telephone Encounter (Signed)
I received a call from [place] at [facility] about scheduling the patient for surgery and faxing the information to her at [facility]. Patient is scheduled for surgery with Dr. Delana Meyer for 08/04/20 at the [facility]. Pre-op is on 07/26/20 phone call between 1-5 pm and covid testing on 08/02/20 between 8-1 pm at the [facility]. Pre-surgical instructions were discussed and will be faxed to attention Celeste.

## 2020-07-26 ENCOUNTER — Other Ambulatory Visit (INDEPENDENT_AMBULATORY_CARE_PROVIDER_SITE_OTHER): Payer: Self-pay | Admitting: Nurse Practitioner

## 2020-07-26 ENCOUNTER — Inpatient Hospital Stay: Admission: RE | Admit: 2020-07-26 | Payer: Medicare Other | Source: Ambulatory Visit

## 2020-07-26 ENCOUNTER — Other Ambulatory Visit: Payer: Medicare Other

## 2020-08-01 ENCOUNTER — Other Ambulatory Visit: Payer: Self-pay

## 2020-08-01 ENCOUNTER — Encounter
Admission: RE | Admit: 2020-08-01 | Discharge: 2020-08-01 | Disposition: A | Discharge status: Home / Self Care | Payer: Medicare Other | Source: Ambulatory Visit | Attending: Vascular Surgery | Admitting: Vascular Surgery

## 2020-08-01 DIAGNOSIS — Z01812 Encounter for preprocedural laboratory examination: Secondary | ICD-10-CM | POA: Insufficient documentation

## 2020-08-01 DIAGNOSIS — Z20822 Contact with and (suspected) exposure to covid-19: Secondary | ICD-10-CM | POA: Insufficient documentation

## 2020-08-01 NOTE — Pre-Procedure Instructions (Signed)
Info provided by [facility] patients caregiver at [facility].

## 2020-08-01 NOTE — Patient Instructions (Addendum)
Your procedure is scheduled on: 08/05/19 Report to [facility]. To find out your arrival time please call [phone] between 1PM - 3PM on 08/04/19.  Remember: Instructions that are not followed completely may result in serious medical risk, up to and including death, or upon the discretion of your surgeon and anesthesiologist your surgery may need to be rescheduled.     _X__ 1. Do not eat food after midnight the night before your procedure.                 No gum chewing or hard candies. You may drink clear liquids up to 2 hours                 before you are scheduled to arrive for your surgery- DO not drink clear                 liquids within 2 hours of the start of your surgery.                 Clear Liquids include:  water, apple juice without pulp, clear carbohydrate                 drink such as Clearfast or Gatorade, Black Coffee or Tea (Do not add                 anything to coffee or tea). Diabetics water only  __X__2.  On the morning of surgery brush your teeth with toothpaste and water, you                 may rinse your mouth with mouthwash if you wish.  Do not swallow any              toothpaste of mouthwash.     _X__ 3.  No Alcohol for 24 hours before or after surgery.   _X__ 4.  Do Not Smoke or use e-cigarettes For 24 Hours Prior to Your Surgery.                 Do not use any chewable tobacco products for at least 6 hours prior to                 surgery.  ____  5.  Bring all medications with you on the day of surgery if instructed.   __X__  6.  Notify your doctor if there is any change in your medical condition      (cold, fever, infections).     Do not wear jewelry, make-up, hairpins, clips or nail polish. Do not wear lotions, powders, or perfumes.  Do not shave 48 hours prior to surgery. Men may shave face and neck. Do not bring valuables to the hospital.    [facility] is not responsible for any belongings or  valuables.  Contacts, dentures/partials or body piercings may not be worn into surgery. Bring a case for your contacts, glasses or hearing aids, a denture cup will be supplied. Leave your suitcase in the car. After surgery it may be brought to your room. For patients admitted to the hospital, discharge time is determined by your treatment team.   Patients discharged the day of surgery will not be allowed to drive home.   Please read over the following fact sheets that you were given:   MRSA Information  __X__ Take these medicines the morning of surgery with A SIP OF WATER:  1. amlodipine  2. clonidine  3. hydralazine  4. lorazepam  5. omeprazole  6. metoprolol  ____ Fleet Enema (as directed)   __X__ Use CHG Soap/SAGE wipes as directed   Instructions provided  ____ Use inhalers on the day of surgery  ____ Stop metformin/Janumet/Farxiga 2 days prior to surgery    __X__ Take 1/2 of usual insulin dose the night before surgery. No insulin the morning          of surgery.   ____ Stop Blood Thinners Coumadin/Plavix/Xarelto/Pleta/Pradaxa/Eliquis/Effient/Aspirin  on   Or contact your Surgeon, Cardiologist or Medical Doctor regarding  ability to stop your blood thinners  __X__ Stop Anti-inflammatories 7 days before surgery such as Advil, Ibuprofen, Motrin,  BC or Goodies Powder, Naprosyn, Naproxen, Aleve, Aspirin    __X__ Stop all herbal supplements, fish oil or vitamin E until after surgery.    ____ Bring C-Pap to the hospital.

## 2020-08-01 NOTE — Progress Notes (Signed)
From caregiver

## 2020-08-02 ENCOUNTER — Other Ambulatory Visit
Admission: RE | Admit: 2020-08-02 | Discharge: 2020-08-02 | Disposition: A | Discharge status: Home / Self Care | Payer: Medicare Other | Source: Ambulatory Visit | Attending: Vascular Surgery | Admitting: Vascular Surgery

## 2020-08-02 DIAGNOSIS — Z20822 Contact with and (suspected) exposure to covid-19: Secondary | ICD-10-CM | POA: Diagnosis not present

## 2020-08-02 DIAGNOSIS — Z01812 Encounter for preprocedural laboratory examination: Secondary | ICD-10-CM | POA: Diagnosis not present

## 2020-08-02 LAB — CBC WITH DIFFERENTIAL/PLATELET
Abs Immature Granulocytes: 0.02 10*3/uL (ref 0.00–0.07)
Basophils Absolute: 0 10*3/uL (ref 0.0–0.1)
Basophils Relative: 0 %
Eosinophils Absolute: 0.2 10*3/uL (ref 0.0–0.5)
Eosinophils Relative: 4 %
HCT: 38.9 % (ref 36.0–46.0)
Hemoglobin: 12.5 g/dL (ref 12.0–15.0)
Immature Granulocytes: 1 %
Lymphocytes Relative: 27 %
Lymphs Abs: 1.2 10*3/uL (ref 0.7–4.0)
MCH: 30.5 pg (ref 26.0–34.0)
MCHC: 32.1 g/dL (ref 30.0–36.0)
MCV: 94.9 fL (ref 80.0–100.0)
Monocytes Absolute: 0.5 10*3/uL (ref 0.1–1.0)
Monocytes Relative: 11 %
Neutro Abs: 2.5 10*3/uL (ref 1.7–7.7)
Neutrophils Relative %: 57 %
Platelets: 167 10*3/uL (ref 150–400)
RBC: 4.1 MIL/uL (ref 3.87–5.11)
RDW: 14.1 % (ref 11.5–15.5)
WBC: 4.3 10*3/uL (ref 4.0–10.5)
nRBC: 0 % (ref 0.0–0.2)

## 2020-08-02 LAB — TYPE AND SCREEN
ABO/RH(D): A POS
Antibody Screen: NEGATIVE

## 2020-08-02 LAB — BASIC METABOLIC PANEL
Anion gap: 8 (ref 5–15)
BUN: 6 mg/dL — ABNORMAL LOW (ref 8–23)
CO2: 31 mmol/L (ref 22–32)
Calcium: 8.2 mg/dL — ABNORMAL LOW (ref 8.9–10.3)
Chloride: 96 mmol/L — ABNORMAL LOW (ref 98–111)
Creatinine, Ser: 1.35 mg/dL — ABNORMAL HIGH (ref 0.44–1.00)
GFR, Estimated: 40 mL/min — ABNORMAL LOW (ref >60)
Glucose, Bld: 120 mg/dL — ABNORMAL HIGH (ref 70–99)
Potassium: 3.3 mmol/L — ABNORMAL LOW (ref 3.5–5.1)
Sodium: 135 mmol/L (ref 135–145)

## 2020-08-02 LAB — PROTIME-INR
INR: 1 (ref 0.8–1.2)
Prothrombin Time: 12.9 seconds (ref 11.4–15.2)

## 2020-08-02 LAB — APTT: aPTT: 36 seconds (ref 24–36)

## 2020-08-03 LAB — SARS CORONAVIRUS 2 (TAT 6-24 HRS): SARS Coronavirus 2: NEGATIVE

## 2020-08-04 ENCOUNTER — Encounter: Payer: Self-pay | Admitting: Vascular Surgery

## 2020-08-04 ENCOUNTER — Encounter
Admission: RE | Disposition: A | Discharge status: Home / Self Care | Payer: Self-pay | Source: Home / Self Care | Attending: Vascular Surgery

## 2020-08-04 ENCOUNTER — Other Ambulatory Visit: Payer: Self-pay

## 2020-08-04 ENCOUNTER — Ambulatory Visit: Payer: Medicare Other | Admitting: Anesthesiology

## 2020-08-04 ENCOUNTER — Ambulatory Visit
Admission: RE | Admit: 2020-08-04 | Discharge: 2020-08-04 | Disposition: A | Discharge status: Home / Self Care | Payer: Medicare Other | Attending: Vascular Surgery | Admitting: Vascular Surgery

## 2020-08-04 DIAGNOSIS — N186 End stage renal disease: Secondary | ICD-10-CM | POA: Diagnosis not present

## 2020-08-04 DIAGNOSIS — Z87891 Personal history of nicotine dependence: Secondary | ICD-10-CM | POA: Insufficient documentation

## 2020-08-04 DIAGNOSIS — I120 Hypertensive chronic kidney disease with stage 5 chronic kidney disease or end stage renal disease: Secondary | ICD-10-CM | POA: Insufficient documentation

## 2020-08-04 DIAGNOSIS — I2510 Atherosclerotic heart disease of native coronary artery without angina pectoris: Secondary | ICD-10-CM | POA: Diagnosis not present

## 2020-08-04 DIAGNOSIS — Z992 Dependence on renal dialysis: Secondary | ICD-10-CM | POA: Insufficient documentation

## 2020-08-04 DIAGNOSIS — N185 Chronic kidney disease, stage 5: Secondary | ICD-10-CM | POA: Diagnosis not present

## 2020-08-04 HISTORY — PX: AV FISTULA PLACEMENT: SHX1204

## 2020-08-04 LAB — POCT I-STAT, CHEM 8
BUN: 24 mg/dL — ABNORMAL HIGH (ref 8–23)
Calcium, Ion: 1.03 mmol/L — ABNORMAL LOW (ref 1.15–1.40)
Chloride: 100 mmol/L (ref 98–111)
Creatinine, Ser: 3.5 mg/dL — ABNORMAL HIGH (ref 0.44–1.00)
Glucose, Bld: 149 mg/dL — ABNORMAL HIGH (ref 70–99)
HCT: 40 % (ref 36.0–46.0)
Hemoglobin: 13.6 g/dL (ref 12.0–15.0)
Potassium: 4.5 mmol/L (ref 3.5–5.1)
Sodium: 136 mmol/L (ref 135–145)
TCO2: 23 mmol/L (ref 22–32)

## 2020-08-04 LAB — GLUCOSE, CAPILLARY
Glucose-Capillary: 146 mg/dL — ABNORMAL HIGH (ref 70–99)
Glucose-Capillary: 129 mg/dL — ABNORMAL HIGH (ref 70–99)

## 2020-08-04 SURGERY — INSERTION OF ARTERIOVENOUS (AV) GORE-TEX GRAFT ARM
Anesthesia: General | Laterality: Left

## 2020-08-04 MED ORDER — BUPIVACAINE HCL (PF) 0.5 % IJ SOLN
INTRAMUSCULAR | Status: AC
  Filled 2020-08-04: qty 30

## 2020-08-04 MED ORDER — HYDROCODONE-ACETAMINOPHEN 5-325 MG PO TABS: 1.0000 | ORAL_TABLET | Freq: Four times a day (QID) | ORAL | 0 refills | Status: AC | PRN

## 2020-08-04 MED ORDER — PROPOFOL 10 MG/ML IV BOLUS
INTRAVENOUS | Status: DC | PRN
  Administered 2020-08-04: 50 mg via INTRAVENOUS

## 2020-08-04 MED ORDER — FENTANYL CITRATE (PF) 100 MCG/2ML IJ SOLN: 25.0000 ug | INTRAMUSCULAR | Status: DC | PRN

## 2020-08-04 MED ORDER — PROPOFOL 10 MG/ML IV BOLUS
INTRAVENOUS | Status: AC
  Filled 2020-08-04: qty 20

## 2020-08-04 MED ORDER — SODIUM CHLORIDE 0.9 % IV SOLN
INTRAVENOUS | Status: DC | PRN
  Administered 2020-08-04: 150 mL via INTRAMUSCULAR

## 2020-08-04 MED ORDER — DEXAMETHASONE SODIUM PHOSPHATE 10 MG/ML IJ SOLN
INTRAMUSCULAR | Status: AC
  Filled 2020-08-04: qty 1

## 2020-08-04 MED ORDER — SUGAMMADEX SODIUM 200 MG/2ML IV SOLN
INTRAVENOUS | Status: DC | PRN
  Administered 2020-08-04: 100 mg via INTRAVENOUS

## 2020-08-04 MED ORDER — VASOPRESSIN 20 UNIT/ML IV SOLN
INTRAVENOUS | Status: DC | PRN
  Administered 2020-08-04: 2 [IU] via INTRAVENOUS
  Administered 2020-08-04 (×3): 1 [IU] via INTRAVENOUS

## 2020-08-04 MED ORDER — OXYCODONE HCL 5 MG/5ML PO SOLN: 5.0000 mg | Freq: Once | ORAL | Status: DC | PRN

## 2020-08-04 MED ORDER — ONDANSETRON HCL 4 MG/2ML IJ SOLN
INTRAMUSCULAR | Status: AC
  Filled 2020-08-04: qty 2

## 2020-08-04 MED ORDER — ONDANSETRON HCL 4 MG/2ML IJ SOLN: 4.0000 mg | Freq: Once | INTRAMUSCULAR | Status: DC | PRN

## 2020-08-04 MED ORDER — SEVOFLURANE IN SOLN
RESPIRATORY_TRACT | Status: AC
  Filled 2020-08-04: qty 250

## 2020-08-04 MED ORDER — CEFAZOLIN SODIUM-DEXTROSE 1-4 GM/50ML-% IV SOLN
INTRAVENOUS | Status: AC
  Filled 2020-08-04: qty 50

## 2020-08-04 MED ORDER — ROCURONIUM BROMIDE 100 MG/10ML IV SOLN
INTRAVENOUS | Status: DC | PRN
  Administered 2020-08-04: 20 mg via INTRAVENOUS
  Administered 2020-08-04: 30 mg via INTRAVENOUS

## 2020-08-04 MED ORDER — CHLORHEXIDINE GLUCONATE CLOTH 2 % EX PADS
6.0000 | MEDICATED_PAD | Freq: Once | CUTANEOUS | Status: AC
  Administered 2020-08-04: 6 via TOPICAL

## 2020-08-04 MED ORDER — ORAL CARE MOUTH RINSE: 15.0000 mL | Freq: Once | OROMUCOSAL | Status: DC

## 2020-08-04 MED ORDER — SODIUM CHLORIDE 0.9 % IV SOLN: INTRAVENOUS | Status: DC

## 2020-08-04 MED ORDER — OXYCODONE HCL 5 MG PO TABS: 5.0000 mg | ORAL_TABLET | Freq: Once | ORAL | Status: DC | PRN

## 2020-08-04 MED ORDER — DEXAMETHASONE SODIUM PHOSPHATE 10 MG/ML IJ SOLN
INTRAMUSCULAR | Status: DC | PRN
  Administered 2020-08-04: 5 mg via INTRAVENOUS

## 2020-08-04 MED ORDER — CEFAZOLIN SODIUM-DEXTROSE 1-4 GM/50ML-% IV SOLN
1.0000 g | INTRAVENOUS | Status: AC
  Administered 2020-08-04: 1 g via INTRAVENOUS

## 2020-08-04 MED ORDER — PAPAVERINE HCL 30 MG/ML IJ SOLN
INTRAMUSCULAR | Status: AC
  Filled 2020-08-04: qty 2

## 2020-08-04 MED ORDER — ROCURONIUM BROMIDE 10 MG/ML (PF) SYRINGE
PREFILLED_SYRINGE | INTRAVENOUS | Status: AC
  Filled 2020-08-04: qty 10

## 2020-08-04 MED ORDER — FENTANYL CITRATE (PF) 100 MCG/2ML IJ SOLN
INTRAMUSCULAR | Status: AC
  Filled 2020-08-04: qty 2

## 2020-08-04 MED ORDER — ACETAMINOPHEN 10 MG/ML IV SOLN
INTRAVENOUS | Status: DC | PRN
  Administered 2020-08-04: 600 mg via INTRAVENOUS

## 2020-08-04 MED ORDER — SUCCINYLCHOLINE CHLORIDE 20 MG/ML IJ SOLN
INTRAMUSCULAR | Status: DC | PRN
  Administered 2020-08-04: 60 mg via INTRAVENOUS

## 2020-08-04 MED ORDER — CHLORHEXIDINE GLUCONATE 0.12 % MT SOLN: 15.0000 mL | Freq: Once | OROMUCOSAL | Status: DC

## 2020-08-04 MED ORDER — BUPIVACAINE LIPOSOME 1.3 % IJ SUSP
INTRAMUSCULAR | Status: AC
  Filled 2020-08-04: qty 20

## 2020-08-04 MED ORDER — ONDANSETRON HCL 4 MG/2ML IJ SOLN
INTRAMUSCULAR | Status: DC | PRN
  Administered 2020-08-04: 4 mg via INTRAVENOUS

## 2020-08-04 MED ORDER — ACETAMINOPHEN 10 MG/ML IV SOLN
INTRAVENOUS | Status: AC
  Filled 2020-08-04: qty 100

## 2020-08-04 MED ORDER — LIDOCAINE HCL (CARDIAC) PF 100 MG/5ML IV SOSY
PREFILLED_SYRINGE | INTRAVENOUS | Status: DC | PRN
  Administered 2020-08-04: 60 mg via INTRAVENOUS
  Administered 2020-08-04: 40 mg via INTRAVENOUS

## 2020-08-04 MED ORDER — HEPARIN SODIUM (PORCINE) 5000 UNIT/ML IJ SOLN
INTRAMUSCULAR | Status: AC
  Filled 2020-08-04: qty 1

## 2020-08-04 MED ORDER — FENTANYL CITRATE (PF) 100 MCG/2ML IJ SOLN
INTRAMUSCULAR | Status: DC | PRN
  Administered 2020-08-04 (×2): 50 ug via INTRAVENOUS

## 2020-08-04 MED ORDER — LIDOCAINE HCL (PF) 2 % IJ SOLN
INTRAMUSCULAR | Status: AC
  Filled 2020-08-04: qty 5

## 2020-08-04 SURGICAL SUPPLY — 68 items
ADH SKN CLS APL DERMABOND .7 (GAUZE/BANDAGES/DRESSINGS) ×1
APL PRP STRL LF DISP 70% ISPRP (MISCELLANEOUS) ×1
APPLIER CLIP 11 MED OPEN (CLIP)
APPLIER CLIP 9.375 SM OPEN (CLIP)
APR CLP MED 11 20 MLT OPN (CLIP)
APR CLP SM 9.3 20 MLT OPN (CLIP)
BAG COUNTER SPONGE EZ (MISCELLANEOUS) ×2 IMPLANT
BAG DECANTER FOR FLEXI CONT (MISCELLANEOUS) ×2 IMPLANT
BAG SPNG 4X4 CLR HAZ (MISCELLANEOUS) ×1
BLADE SURG SZ11 CARB STEEL (BLADE) ×2 IMPLANT
BOOT SUTURE AID YELLOW STND (SUTURE) ×2 IMPLANT
BRUSH SCRUB EZ  4% CHG (MISCELLANEOUS) ×1
BRUSH SCRUB EZ 4% CHG (MISCELLANEOUS) ×1 IMPLANT
CANISTER SUCT 1200ML W/VALVE (MISCELLANEOUS) ×2 IMPLANT
CHLORAPREP W/TINT 26 (MISCELLANEOUS) ×2 IMPLANT
CLIP APPLIE 11 MED OPEN (CLIP) IMPLANT
CLIP APPLIE 9.375 SM OPEN (CLIP) IMPLANT
COVER LIGHT HANDLE STERIS (MISCELLANEOUS) ×4 IMPLANT
COVER WAND RF STERILE (DRAPES) ×2 IMPLANT
DERMABOND ADVANCED (GAUZE/BANDAGES/DRESSINGS) ×1
DERMABOND ADVANCED .7 DNX12 (GAUZE/BANDAGES/DRESSINGS) ×1 IMPLANT
DRAPE EXTREMITY T 121X128X90 (DISPOSABLE) ×2 IMPLANT
DRAPE IMP U-DRAPE 54X76 (DRAPES) ×2 IMPLANT
DRESSING SURGICEL FIBRLLR 1X2 (HEMOSTASIS) ×1 IMPLANT
DRSG SURGICEL FIBRILLAR 1X2 (HEMOSTASIS) ×2
ELECT CAUTERY BLADE 6.4 (BLADE) ×2 IMPLANT
ELECT REM PT RETURN 9FT ADLT (ELECTROSURGICAL) ×2
ELECTRODE REM PT RTRN 9FT ADLT (ELECTROSURGICAL) ×1 IMPLANT
GLOVE INDICATOR 7.5 STRL GRN (GLOVE) ×2 IMPLANT
GLOVE SURG ENC MOIS LTX SZ7 (GLOVE) ×6 IMPLANT
GLOVE SURG SYN 8.0 (GLOVE) ×8 IMPLANT
GOWN STRL REUS W/ TWL LRG LVL3 (GOWN DISPOSABLE) ×2 IMPLANT
GOWN STRL REUS W/ TWL XL LVL3 (GOWN DISPOSABLE) ×1 IMPLANT
GOWN STRL REUS W/TWL LRG LVL3 (GOWN DISPOSABLE) ×4
GOWN STRL REUS W/TWL XL LVL3 (GOWN DISPOSABLE) ×2
GRAFT PROPATEN STD WALL 4 7X45 (Vascular Products) ×1 IMPLANT
IV NS 500ML (IV SOLUTION) ×2
IV NS 500ML BAXH (IV SOLUTION) ×1 IMPLANT
KIT TURNOVER KIT A (KITS) ×2 IMPLANT
LABEL OR SOLS (LABEL) ×2 IMPLANT
LOOP RED MAXI  1X406MM (MISCELLANEOUS) ×1
LOOP VESSEL MAXI 1X406 RED (MISCELLANEOUS) ×1 IMPLANT
LOOP VESSEL MINI 0.8X406 BLUE (MISCELLANEOUS) ×2 IMPLANT
LOOPS BLUE MINI 0.8X406MM (MISCELLANEOUS) ×2
MANIFOLD NEPTUNE II (INSTRUMENTS) ×2 IMPLANT
NEEDLE FILTER BLUNT 18X 1/2SAF (NEEDLE) ×1
NEEDLE FILTER BLUNT 18X1 1/2 (NEEDLE) ×1 IMPLANT
NS IRRIG 500ML POUR BTL (IV SOLUTION) ×2 IMPLANT
PACK EXTREMITY ARMC (MISCELLANEOUS) ×2 IMPLANT
PAD PREP 24X41 OB/GYN DISP (PERSONAL CARE ITEMS) ×2 IMPLANT
PENCIL ELECTRO HAND CTR (MISCELLANEOUS) ×2 IMPLANT
STOCKINETTE STRL 4IN 9604848 (GAUZE/BANDAGES/DRESSINGS) ×4 IMPLANT
SUT GTX CV-6 30 (SUTURE) ×4 IMPLANT
SUT MNCRL+ 5-0 UNDYED PC-3 (SUTURE) ×1 IMPLANT
SUT MONOCRYL 5-0 (SUTURE) ×1
SUT PROLENE 6 0 BV (SUTURE) ×4 IMPLANT
SUT SILK 2 0 (SUTURE) ×2
SUT SILK 2 0 SH (SUTURE) ×2 IMPLANT
SUT SILK 2-0 18XBRD TIE 12 (SUTURE) ×1 IMPLANT
SUT SILK 3 0 (SUTURE) ×2
SUT SILK 3-0 18XBRD TIE 12 (SUTURE) ×1 IMPLANT
SUT SILK 4 0 (SUTURE) ×2
SUT SILK 4-0 18XBRD TIE 12 (SUTURE) ×1 IMPLANT
SUT VIC AB 3-0 SH 27 (SUTURE) ×4
SUT VIC AB 3-0 SH 27X BRD (SUTURE) ×2 IMPLANT
SYR 20ML LL LF (SYRINGE) ×2 IMPLANT
SYR 3ML LL SCALE MARK (SYRINGE) ×2 IMPLANT
TUBING CONNECTING 10 (TUBING) ×2 IMPLANT

## 2020-08-04 NOTE — Interval H&P Note (Signed)
History and Physical Interval Note:  08/04/2020 8:54 AM  Alyssa [name]  has presented today for surgery, with the diagnosis of ESRD.  The various methods of treatment have been discussed with the patient and family. After consideration of risks, benefits and other options for treatment, the patient has consented to  Procedure(s): INSERTION OF ARTERIOVENOUS (AV) GORE-TEX GRAFT ARM   ( BRACHIAL AXILLARY) (Left) as a surgical intervention.  The patient's history has been reviewed, patient examined, no change in status, stable for surgery.  I have reviewed the patient's chart and labs.  Questions were answered to the patient's satisfaction.     [name]

## 2020-08-04 NOTE — Transfer of Care (Signed)
Immediate Anesthesia Transfer of Care Note  Patient: DUSTIN BURRILL  Procedure(s) Performed: INSERTION OF ARTERIOVENOUS (AV) GORE-TEX GRAFT ARM   ( BRACHIAL AXILLARY) (Left )  Patient Location: PACU  Anesthesia Type:General  Level of Consciousness: drowsy and patient cooperative  Airway & Oxygen Therapy: Patient Spontanous Breathing  Post-op Assessment: report to RN; VSS  Post vital signs: Reviewed and stable  Last Vitals:  Vitals Value Taken Time  BP 143/55 08/04/20 1037  Temp    Pulse 65 08/04/20 1038  Resp 18 08/04/20 1038  SpO2 100 % 08/04/20 1038  Vitals shown include unvalidated device data.  Last Pain:  Vitals:   08/04/20 0812  TempSrc: Oral         Complications: No complications documented.

## 2020-08-04 NOTE — OR Nursing (Signed)
Per Dr. Delana Meyer secure chat - patient may resume taking aspirin today.  Added to d/c instructions/med section.

## 2020-08-04 NOTE — Progress Notes (Addendum)
PT called from waiting room and arrived with caregiver from nursing facility. Per caregiver pt placed a piece of candy in her mouth while she was parking the car. Pt refuses to take out of her mouth and is chewing at this time. Pt hx of dementia and is at baseline. Unaware of type of candy. Dr. Andree Elk and Dr. Delana Meyer made aware. Case will be delayed. Awaiting anesthesia  to bedside

## 2020-08-04 NOTE — Anesthesia Procedure Notes (Signed)
Procedure Name: Intubation Date/Time: 08/04/2020 8:43 AM Performed by: Jonna Clark, CRNA Pre-anesthesia Checklist: Patient identified, Patient being monitored, Timeout performed, Emergency Drugs available and Suction available Patient Re-evaluated:Patient Re-evaluated prior to induction Oxygen Delivery Method: Circle system utilized Preoxygenation: Pre-oxygenation with 100% oxygen Induction Type: IV induction Ventilation: Mask ventilation without difficulty Laryngoscope Size: Mac and 3 Grade View: Grade II Tube type: Oral Tube size: 6.5 mm Number of attempts: 1 Airway Equipment and Method: Stylet Placement Confirmation: ETT inserted through vocal cords under direct vision,  positive ETCO2 and breath sounds checked- equal and bilateral Secured at: 21 cm Tube secured with: Tape Dental Injury: Teeth and Oropharynx as per pre-operative assessment

## 2020-08-04 NOTE — Anesthesia Postprocedure Evaluation (Signed)
Anesthesia Post Note  Patient: Alyssa [name]  Procedure(s) Performed: INSERTION OF ARTERIOVENOUS (AV) GORE-TEX GRAFT ARM   ( BRACHIAL AXILLARY) (Left )  Patient location during evaluation: PACU Anesthesia Type: General Level of consciousness: awake and alert Pain management: pain level controlled Vital Signs Assessment: post-procedure vital signs reviewed and stable Respiratory status: spontaneous breathing, nonlabored ventilation, respiratory function stable and patient connected to nasal cannula oxygen Cardiovascular status: blood pressure returned to baseline and stable Postop Assessment: no apparent nausea or vomiting Anesthetic complications: no   No complications documented.   Last Vitals:  Vitals:   08/04/20 1105 08/04/20 1144  BP: (!) 132/51 (!) 149/53  Pulse: (!) 56 61  Resp: 13 14  Temp:  (!) 36.1 C  SpO2: 99% 100%    Last Pain:  Vitals:   08/04/20 1144  TempSrc: Temporal  PainSc: 0-No pain                 Arita Miss

## 2020-08-04 NOTE — H&P (Signed)
[facility] Admission History & Physical  MRN : [id number]  Alyssa [name] is a 80 y.o. (1941-06-29) female who presents with chief complaint of scheduled access creation.  History of Present Illness:  The patient is seen for evaluation of dialysis access on 06/29/20. The patient has had a tunneled catheter placed on 03/28/2020   Current access is via a catheter which is functioning poorly.  There have not been any episodes of catheter infection.  The patient denies amaurosis fugax or recent TIA symptoms. There are no recent neurological changes noted. The patient denies claudication symptoms or rest pain symptoms. The patient denies history of DVT, PE or superficial thrombophlebitis. The patient denies recent episodes of angina or shortness of breath.   Duplex ultrasound for vein mapping demonstrates superficial veins of both upper extremities are small 2 mm or less and not adequate for fistula creation.  Patient presents today for dialysis access creation.  Current Facility-Administered Medications  Medication Dose Route Frequency Provider Last Rate Last Admin  . 0.9 %  sodium chloride infusion   Intravenous Continuous Molli Barrows, MD 10 mL/hr at 08/04/20 0805 New Bag at 08/04/20 0805  . ceFAZolin (ANCEF) 1-4 GM/50ML-% IVPB           . ceFAZolin (ANCEF) IVPB 1 g/50 mL premix  1 g Intravenous On Call to OR Kris Hartmann, NP      . chlorhexidine (PERIDEX) 0.12 % solution 15 mL  15 mL Mouth/Throat Once Molli Barrows, MD       Or  . MEDLINE mouth rinse  15 mL Mouth Rinse Once Molli Barrows, MD       Past Medical History:  Diagnosis Date  . Chronic systolic CHF (congestive heart failure) ([place])   . CKD (chronic kidney disease), stage III ([place])   . Dementia ([place])   . Essential hypertension   . Hyperlipidemia   . Hypertensive Non-ischemic Cardiomyopathy    a. 2010 Echo: EF 55%;  b. 2010 borderline MV w/ apical thinning; c. 2010 Cath: LCX 50p->Med Rx;   d. 09/2014 Echo: EF 35-40% w/ Sev LVH and glob HK;  e. 09/2014 MV: EF 30-44%, small fixed apical defect (breast attenuation), no ischemia.  . Hypokalemia   . Type II diabetes mellitus ([place])    Past Surgical History:  Procedure Laterality Date  . ABDOMINAL HYSTERECTOMY    . CHOLECYSTECTOMY    . DIALYSIS/PERMA CATHETER INSERTION N/A 03/28/2020   Procedure: DIALYSIS/PERMA CATHETER INSERTION;  Surgeon: Katha Cabal, MD;  Location: [facility];  Service: Cardiovascular;  Laterality: N/A;  . TONSILLECTOMY     Social History Social History   Tobacco Use  . Smoking status: Former Smoker    Packs/day: 0.25    Years: 10.00    Pack years: 2.50    Types: Cigarettes  . Smokeless tobacco: Former Systems developer    Types: Secondary school teacher  . Vaping Use: Never used  Substance Use Topics  . Alcohol use: Not Currently    Alcohol/week: 1.0 standard drink    Types: 1 Cans of beer per week  . Drug use: No   Family History Family History  Problem Relation Age of Onset  . Diabetes Mother   . Hypertension Mother   . Diabetes Father   . Hypertension Father   . Breast cancer Neg Hx   No family history of peripheral artery disease, venous disease or renal disease.  No Known Allergies  REVIEW OF SYSTEMS (  Negative unless checked)  Constitutional: [] Weight loss  [] Fever  [] Chills Cardiac: [] Chest pain   [] Chest pressure   [] Palpitations   [] Shortness of breath when laying flat   [x] Shortness of breath at rest   [] Shortness of breath with exertion. Vascular:  [] Pain in legs with walking   [] Pain in legs at rest   [] Pain in legs when laying flat   [] Claudication   [] Pain in feet when walking  [] Pain in feet at rest  [] Pain in feet when laying flat   [] History of DVT   [] Phlebitis   [] Swelling in legs   [] Varicose veins   [] Non-healing ulcers Pulmonary:   [] Uses home oxygen   [] Productive cough   [] Hemoptysis   [] Wheeze  [] COPD   [] Asthma Neurologic:  [] Dizziness  [] Blackouts   [] Seizures    [] History of stroke   [] History of TIA  [] Aphasia   [] Temporary blindness   [] Dysphagia   [] Weakness or numbness in arms   [] Weakness or numbness in legs Musculoskeletal:  [] Arthritis   [] Joint swelling   [] Joint pain   [] Low back pain Hematologic:  [] Easy bruising  [] Easy bleeding   [] Hypercoagulable state   [x] Anemic  [] Hepatitis Gastrointestinal:  [] Blood in stool   [] Vomiting blood  [] Gastroesophageal reflux/heartburn   [] Difficulty swallowing. Genitourinary:  [x] Chronic kidney disease   [] Difficult urination  [] Frequent urination  [] Burning with urination   [] Blood in urine Skin:  [] Rashes   [] Ulcers   [] Wounds Psychological:  [] History of anxiety   []  History of major depression.  Physical Examination  Vitals:   08/04/20 0756 08/04/20 0812  BP: (!) 178/63   Pulse: 61   Resp: 16   Temp:  97.8 F (36.6 C)  TempSrc:  Oral  SpO2: 100%    There is no height or weight on file to calculate BMI. Gen: WD/WN, NAD Head: [place]/AT, No temporalis wasting.  Ear/Nose/Throat: Hearing grossly intact, nares w/o erythema or drainage, oropharynx w/o Erythema/Exudate, Eyes: Sclera non-icteric, conjunctiva clear Neck: Supple, no nuchal rigidity.  No JVD.  Pulmonary:  Good air movement, no increased work of respiration or use of accessory muscles  Cardiac: RRR, normal S1, S2, no Murmurs, rubs or gallops. Vascular:  Vessel Right Left  Radial Palpable Palpable  Ulnar Palpable Palpable  Brachial Palpable Palpable  Carotid Palpable, without bruit Palpable, without bruit  Aorta Not palpable N/A  Femoral Palpable Palpable  Popliteal Palpable Palpable  PT Palpable Palpable  DP Palpable Palpable   Gastrointestinal: soft, non-tender/non-distended. No guarding/reflex. No masses, surgical incisions, or scars. Musculoskeletal: M/S 5/5 throughout.  No deformity or atrophy.  Mild edema Neurologic: Sensation grossly intact in extremities.  Symmetrical.  Speech is fluent. Motor exam as listed  above. Psychiatric: Judgment intact, Mood & affect appropriate for pt's clinical situation. Dermatologic: No rashes or ulcers noted.  No cellulitis or open wounds. Lymph : No Cervical, Axillary, or Inguinal lymphadenopathy.  CBC Lab Results  Component Value Date   WBC 4.3 08/02/2020   HGB 13.6 08/04/2020   HCT 40.0 08/04/2020   MCV 94.9 08/02/2020   PLT 167 08/02/2020   BMET    Component Value Date/Time   NA 136 08/04/2020 0800   NA 139 05/25/2018 1007   NA 138 10/28/2014 0105   K 4.5 08/04/2020 0800   K 3.7 10/28/2014 0105   CL 100 08/04/2020 0800   CL 106 10/28/2014 0105   CO2 31 08/02/2020 1509   CO2 26 10/28/2014 0105   GLUCOSE 149 (H) 08/04/2020 0800  GLUCOSE 212 (H) 10/28/2014 0105   BUN 24 (H) 08/04/2020 0800   BUN 17 05/25/2018 1007   BUN 15 10/28/2014 0105   CREATININE 3.50 (H) 08/04/2020 0800   CREATININE 1.12 (H) 10/28/2014 0105   CALCIUM 8.2 (L) 08/02/2020 1509   CALCIUM 8.3 (L) 10/28/2014 0105   GFRNONAA 40 (L) 08/02/2020 1509   GFRNONAA 49 (L) 10/28/2014 0105   GFRAA 31 (L) 05/25/2018 1007   GFRAA 57 (L) 10/28/2014 0105   Estimated Creatinine Clearance: 9.7 mL/min (A) (by C-G formula based on SCr of 3.5 mg/dL (H)).  COAG Lab Results  Component Value Date   INR 1.0 08/02/2020   INR 0.95 03/05/2016   INR 1.0 10/27/2014   Radiology No results found.   Assessment/Plan 1. End stage renal disease ([place]) At this time the patient does not have appropriate extremity access for dialysis Patient should have a left brachial axillary AV graft created. The risks, benefits and alternative therapies were reviewed in detail with the patient.  All questions were answered.  The patient agrees to proceed with surgery.   2. Complication of vascular access for dialysis, sequela Catheter is functioning at the present time and therefore we will continue to use this as her access  3. Essential hypertension Continue antihypertensive medications as already ordered,  these medications have been reviewed and there are no changes at this time.  4. Coronary artery disease involving native coronary artery of native heart without angina pectoris Continue cardiac and antihypertensive medications as already ordered and reviewed, no changes at this time. Continue statin as ordered and reviewed, no changes at this time Nitrates PRN for chest pain  Discussed with Francene Castle, PA-C  08/04/2020 8:37 AM

## 2020-08-04 NOTE — Anesthesia Preprocedure Evaluation (Addendum)
Anesthesia Evaluation  Patient identified by MRN, date of birth, ID band Patient awake and Patient confused  General Assessment Comment:Patient had an unknown candy at 0630am (patient has dementia at baseline). Judging by clear wrapper, probably some sort of small hard candy. Will wait 2 hours for NPO time; plan for GA regardless as patient is not appropriate for block given dementia.  Reviewed: Allergy & Precautions, NPO status , Patient's Chart, lab work & pertinent test results  History of Anesthesia Complications Negative for: history of anesthetic complications  Airway Mallampati: II  TM Distance: >3 FB Neck ROM: Full    Dental  (+) Edentulous Upper, Edentulous Lower   Pulmonary neg pulmonary ROS, neg sleep apnea, neg COPD, Patient abstained from smoking.Not current smoker, former smoker,    Pulmonary exam normal breath sounds clear to auscultation       Cardiovascular Exercise Tolerance: Poor METShypertension, + CAD and +CHF  (-) Past MI (-) dysrhythmias  Rhythm:Regular Rate:Normal - Systolic murmurs TTE 2878: Left ventricular ejection fraction, by estimation, is 60 to 65%. The  left ventricle has normal function. The left ventricle has no regional  wall motion abnormalities. There is severe concentric left ventricular  hypertrophy. Left ventricular diastolic  parameters are consistent with Grade I diastolic dysfunction (impaired  relaxation).  2. Right ventricular systolic function is normal. The right ventricular  size is normal.  3. Left atrial size was mildly dilated.  4. The mitral valve is normal in structure. No evidence of mitral valve  regurgitation.  5. The aortic valve is tricuspid. Aortic valve regurgitation is mild.  Mild aortic valve stenosis.  6. The inferior vena cava is normal in size with greater than 50%  respiratory variability, suggesting right atrial pressure of 3 mmHg.     Neuro/Psych PSYCHIATRIC DISORDERS Dementia negative neurological ROS     GI/Hepatic neg GERD  ,(+)     (-) substance abuse  ,   Endo/Other  diabetes, Type 2  Renal/GU ESRF and DialysisRenal diseaseLast dialyzed two days prior 08/02/20; on MWF schedule. Feels well today from fluid perspective     Musculoskeletal   Abdominal   Peds  Hematology   Anesthesia Other Findings Past Medical History: No date: Chronic systolic CHF (congestive heart failure) (HCC) No date: CKD (chronic kidney disease), stage III (HCC) No date: Dementia ([place]) No date: Essential hypertension No date: Hyperlipidemia No date: Hypertensive Non-ischemic Cardiomyopathy     Comment:  a. 2010 Echo: EF 55%;  b. 2010 borderline MV w/ apical               thinning; c. 2010 Cath: LCX 50p->Med Rx;  d. 09/2014 Echo:              EF 35-40% w/ Sev LVH and glob HK;  e. 09/2014 MV: EF               30-44%, small fixed apical defect (breast attenuation),               no ischemia. No date: Hypokalemia No date: Type II diabetes mellitus (HCC)  Reproductive/Obstetrics                          Anesthesia Physical Anesthesia Plan  ASA: IV  Anesthesia Plan: General   Post-op Pain Management:    Induction: Intravenous  PONV Risk Score and Plan: 3 and Ondansetron, Dexamethasone and Treatment may vary due to age or medical condition  Airway Management Planned:  Oral ETT  Additional Equipment: None  Intra-op Plan:   Post-operative Plan: Extubation in OR  Informed Consent: I have reviewed the patients History and Physical, chart, labs and discussed the procedure including the risks, benefits and alternatives for the proposed anesthesia with the patient or authorized representative who has indicated his/her understanding and acceptance.     Dental advisory given  Plan Discussed with: CRNA and Surgeon  Anesthesia Plan Comments: (Patient's legal guardian is the [employer] of  social services. Surgical consent had been obtained from [facility], Scientist, research (life sciences) DSS, prior to my interview. I myself have been unable to reach DSS for anesthesia consent; given that the overall procedural consent has already been obtained by surgical team, it is reasonable to proceed.)       Anesthesia Quick Evaluation

## 2020-08-04 NOTE — Op Note (Signed)
     OPERATIVE NOTE   PROCEDURE: Left brachial cephalic arteriovenous fistula placement  PRE-OPERATIVE DIAGNOSIS: End Stage Renal Disease  POST-OPERATIVE DIAGNOSIS: End Stage Renal Disease  SURGEON: [name]  ASSISTANT(S): Ms. Hezzie Bump  ANESTHESIA: general  ESTIMATED BLOOD LOSS: <50 cc  FINDING(S): 4 mm cephalic vein  SPECIMEN(S):  none  INDICATIONS:   Alyssa [name] is a 80 y.o. female who presents with end stage renal disease.  The patient is scheduled for left brachiocephalic arteriovenous fistula placement.  The patient is aware the risks include but are not limited to: bleeding, infection, steal syndrome, nerve damage, ischemic monomelic neuropathy, failure to mature, and need for additional procedures.  The patient is aware of the risks of the procedure and elects to proceed forward.  DESCRIPTION: After full informed written consent was obtained from the patient, the patient was brought back to the operating room and placed supine upon the operating table.  Prior to induction, the patient received IV antibiotics.   After obtaining adequate anesthesia, the patient was then prepped and draped in the standard fashion for a left arm access procedure.   A first assistant was required to provide a safe and appropriate environment for executing the surgery.  The assistant was integral in providing retraction, exposure, running suture providing suction and in the closing process.   A curvilinear incision was then created midway between the radial impulse and the cephalic vein. The cephalic vein was then identified and dissected circumferentially. It was marked with a surgical marker.    Attention was then turned to the brachial artery which was exposed through the same incision and looped proximally and distally. Side branches were controlled with 4-0 silk ties.  The distal segment of the vein was ligated with a  2-0 silk, and the vein was transected.  The proximal  segment was interrogated with serial dilators.  The vein accepted up to a 4 mm dilator without any difficulty. Heparinized saline was infused into the vein and clamped it with a small bulldog.  At this point, I reset my exposure of the brachial artery and controlled the artery with vessel loops proximally and distally.  An arteriotomy was then made with a #11 blade, and extended with a Potts scissor.  Heparinized saline was injected proximal and distal into the radial artery.  The vein was then approximated to the artery while the artery was in its native bed and subsequently the vein was beveled using Potts scissors. The vein was then sewn to the artery in an end-to-side configuration with a running stitch of 6-0 Prolene.  Prior to completing this anastomosis Flushing maneuvers were performed and the artery was allowed to forward and back bleed.  There was no evidence of clot from any vessels.  I completed the anastomosis in the usual fashion and then released all vessel loops and clamps.    There was good  thrill in the venous outflow, and there was 1+ palpable radial pulse.  At this point, I irrigated out the surgical wound.  There was no further active bleeding.  The subcutaneous tissue was reapproximated with a running stitch of 3-0 Vicryl.  The skin was then reapproximated with a running subcuticular stitch of 4-0 Vicryl.  The skin was then cleaned, dried, and reinforced with Dermabond.    The patient tolerated this procedure well.   COMPLICATIONS: None  CONDITION: Alyssa [name] Alyssa [name]  Office: [phone]   08/04/2020, 10:26 AM

## 2020-08-04 NOTE — Discharge Instructions (Signed)

## 2020-08-28 ENCOUNTER — Other Ambulatory Visit (INDEPENDENT_AMBULATORY_CARE_PROVIDER_SITE_OTHER): Payer: Self-pay | Admitting: Nurse Practitioner

## 2020-08-28 DIAGNOSIS — N186 End stage renal disease: Secondary | ICD-10-CM

## 2020-08-28 NOTE — Progress Notes (Unsigned)
Cardiology Office Note  Date:  08/29/2020   ID:  Alyssa, [name] [birth year], MRN [id number]  PCP:  Orvis Brill, [facility]   Chief Complaint  Patient presents with  . Other    4 month f/u c/o edema and elevated BP after Dialysis. Meds reviewed verbally with pt.    HPI:  80 year old female with history of  CAD with cath in 2010 that showed minimal CAD,  DM2,  HLD,  migraines   [facility] October 25 2014 for hypertensive urgency and demand ischemia  [facility] on 10/27/14 with uncontrolled HTN with BP greater than 200/100 and elevated troponin and chest pain.  Alzheimer's dementia /insomnia Previous hospital admission 2016 Echo 2016 showed an EF of 35-40% with severe LVH and global hypokinesis likely hypertensive heart disease.  smoker She presents for HTN, cardiomyopathy  Lives at the [facility] ,  [place] that she feels well, denies shortness of breath, no chest pain  Last seen in cardiology clinic by myself in 08/2019 Medication changes made at that time for high blood pressure Recent visit by one of our providers September 2021 He was noted to have well-controlled blood pressure 82 presents with her today reports that since hemodialysis started, blood pressures have been markedly elevated consistently  They are requesting medication change Numbers provided and reviewed by myself, typically systolic pressure 235 up to 220 both before and after hemodialysis Intact they report pressure seem to go higher after dialysis They deny holding any medications night before or the morning of dialysis  Recent studies reviewed Echo 12/2019 reviewed in detail 1. Left ventricular ejection fraction, by estimation, is 60 to 65%. The  left ventricle has normal function. The left ventricle has no regional  wall motion abnormalities. There is severe concentric left ventricular  hypertrophy. Left ventricular diastolic  parameters are consistent with Grade I  diastolic dysfunction (impaired  relaxation).  2. Right ventricular systolic function is normal. The right ventricular  size is normal.   EKG personally reviewed by myself on todays visit Shows normal sinus rhythm/sinus bradycardia rate 58 bpm LVH with strain pattern/recall  Past medical history reviewed Notes indicating history of dementia/sundowning -previously seen by Psych nurse Notes indicating she is up late, wandering the halls and going into other residents rooms on ativan, seems to help her sleep  Previous records reviewed Myoview stress test June 2016 No ischemia  Other past medical history reviewed She was admitted to [facility] back in 2010 for chest pain echo at that time time showed EF 55%, no LV thrombus, no wall motion abnormalities, trace AI.  Cardiac enzymes were negative at that time. She underwent Myoview stress test that showed borderline abnormal Myoview with apical thinning. No evidence of stress-induced myocardial ischemia and ejection fraction of 60%. Medical management recommended.   cardiac catheterization 2010 done by Dr. 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 No intervention was done.   felt she either had costochondritis vs stress induced chest pain.   PMH:   has a past medical history of Chronic systolic CHF (congestive heart failure) ([place]), CKD (chronic kidney disease), stage III ([place]), Dementia ([place]), Essential hypertension, Hyperlipidemia, Hypertensive Non-ischemic Cardiomyopathy, Hypokalemia, and Type II diabetes mellitus ([place]).  PSH:    Past Surgical History:  Procedure Laterality Date  . ABDOMINAL HYSTERECTOMY    . AV FISTULA PLACEMENT Left 08/04/2020   Procedure: INSERTION OF ARTERIOVENOUS (AV) GORE-TEX GRAFT ARM   ( BRACHIAL AXILLARY);  Surgeon: Katha Cabal, MD;  Location: [facility] ORS;  Service: Vascular;  Laterality: Left;  . CHOLECYSTECTOMY    . DIALYSIS/PERMA CATHETER INSERTION N/A 03/28/2020    Procedure: DIALYSIS/PERMA CATHETER INSERTION;  Surgeon: Katha Cabal, MD;  Location: [place] CV LAB;  Service: Cardiovascular;  Laterality: N/A;  . TONSILLECTOMY      Current Outpatient Medications  Medication Sig Dispense Refill  . amLODipine (NORVASC) 10 MG tablet TAKE (1) TABLET BY MOUTH EVERY DAY (Patient taking differently: Take 10 mg by mouth daily. (0700)) 90 tablet 3  . aspirin EC 81 MG tablet Take 81 mg by mouth daily. (0700)    . Blood Glucose Monitoring Suppl (TRUETRACK BLOOD GLUCOSE) w/Device KIT 1 each by Does not apply route daily. 1 each 0  . Calcium Carb-Cholecalciferol (CALCIUM 600+D3) 600-800 MG-UNIT TABS Take 1 tablet by mouth daily. (0700)    . cloNIDine (CATAPRES) 0.2 MG tablet Take 1 tablet (0.2 mg total) by mouth 3 (three) times daily. 90 tablet 6  . donepezil (ARICEPT) 10 MG tablet Take 10 mg by mouth at bedtime. (2000)    . ferrous sulfate 325 (65 FE) MG tablet Take 325 mg by mouth daily with breakfast. (0700)    . glipiZIDE (GLUCOTROL) 5 MG tablet Take 5 mg by mouth daily. (0700)    . GLOBAL INSULIN SYRINGES 30G X 5/16" 0.3 ML MISC USE ONCE A DAY FOR INSULIN 100 each 1  . glucose blood (RELION PRIME TEST) test strip Test twice a day 100 each 12  . hydrALAZINE (APRESOLINE) 50 MG tablet Take 1 tablet (50 mg total) by mouth 3 (three) times daily. 90 tablet 6  . HYDROcodone-acetaminophen (NORCO) 5-325 MG tablet Take 1-2 tablets by mouth every 6 (six) hours as needed. 20 tablet 0  . LORazepam (ATIVAN) 0.5 MG tablet Take 0.25 mg by mouth 2 (two) times daily.    Marland Kitchen losartan (COZAAR) 100 MG tablet Take 100 mg by mouth daily.    . Melatonin 5 MG CAPS Take 2 capsules (10 mg total) by mouth at bedtime. (Patient taking differently: Take 5 mg by mouth at bedtime. (2000)) 60 capsule 5  . metoprolol succinate (TOPROL-XL) 100 MG 24 hr tablet Take 100 mg by mouth daily.    . mirtazapine (REMERON) 15 MG tablet Take 1 tablet (15 mg total) by mouth at bedtime. (Patient  taking differently: Take 15 mg by mouth at bedtime. (2000)) 30 tablet 5  . omeprazole (PRILOSEC) 40 MG capsule TAKE ONE CAPSULE TWICE A DAY (Patient taking differently: Take 40 mg by mouth in the morning and at bedtime. (0700 & 1700)) 60 capsule 5  . polyethylene glycol powder (GLYCOLAX/MIRALAX) powder Take 17 g by mouth 3 (three) times a week. (Patient taking differently: Take 17 g by mouth 3 (three) times a week. (0700)) 225 g 5  . simvastatin (ZOCOR) 40 MG tablet TAKE ONE TABLET BY MOUTH AT BEDTIME (Patient taking differently: Take 40 mg by mouth at bedtime. (2000)) 90 tablet 1   No current facility-administered medications for this visit.     Allergies:   Patient has no known allergies.   Social History:  The patient  reports that she has quit smoking. Her smoking use included cigarettes. She has a 2.50 pack-year smoking history. She has quit using smokeless tobacco.  Her smokeless tobacco use included chew. She reports previous alcohol use of about 1.0 standard drink of alcohol per week. She reports that she does not use drugs.   Family History:  family history includes Diabetes in her father and mother; Hypertension in her father and mother.    Review of Systems: Review of Systems  Constitutional: Negative.   HENT: Negative.   Respiratory: Negative.   Cardiovascular: Negative.   Gastrointestinal: Negative.   Musculoskeletal: Negative.   Neurological: Negative.   Psychiatric/Behavioral: Negative.   All other systems reviewed and are negative.   PHYSICAL EXAM: VS:  BP (!) 168/50 (BP Location: Right Arm, Patient Position: Sitting, Cuff Size: Normal)   Pulse (!) 58   Ht '5\' 4"'  (1.626 m)   Wt 115 lb (52.2 kg)   SpO2 99%   BMI 19.74 kg/m  , BMI Body mass index is 19.74 kg/m. Constitutional:  oriented to person, place, and time. No distress.  HENT:  Head: Grossly normal Eyes:  no discharge. No scleral icterus.  Neck: No JVD, no carotid bruits  Cardiovascular: Regular rate  and rhythm, 3/6 SEM LSB Pulmonary/Chest: Clear to auscultation bilaterally, no wheezes or rails Abdominal: Soft.  no distension.  no tenderness.  Musculoskeletal: Normal range of motion Neurological:  normal muscle tone. Coordination normal. No atrophy Skin: Skin warm and dry Psychiatric: normal affect, pleasant   Recent Labs: 08/02/2020: Platelets 167 08/04/2020: BUN 24; Creatinine, Ser 3.50; Hemoglobin 13.6; Potassium 4.5; Sodium 136    Lipid Panel Lab Results  Component Value Date   CHOL 170 05/25/2018   HDL 46 05/25/2018   LDLCALC 97 05/25/2018   TRIG 135 05/25/2018      Wt Readings from Last 3 Encounters:  08/29/20 115 lb (52.2 kg)  08/01/20 105 lb (47.6 kg)  06/29/20 99 lb 12.8 oz (45.3 kg)     ASSESSMENT AND PLAN:  Problem List Items Addressed This Visit      Cardiology Problems   Coronary artery disease involving native coronary artery of native heart without angina pectoris (Chronic)     Other   Alzheimer's dementia without behavioral disturbance (HCC)    Other Visit Diagnoses    HFrEF (heart failure with reduced ejection fraction) ([place])    -  Primary   Hypertensive heart and chronic kidney disease with heart failure and with stage 5 chronic kidney disease, or end stage renal disease (HCC)       Chronic kidney disease, unspecified CKD stage         Malignant hypertension Was well controlled per prior cardiology note prior to the initiation of hemodialysis Now I would seen blood pressure consistently elevated as detailed above Recommend we increase hydralazine up to 100 mg 3 times daily, and isosorbide 30 mg daily Recommend they fax [place] with blood pressure measurements in the next 2 to 3 weeks If continues to run high, would increase isosorbide up to 60 if tolerated, but increase hydralazine up to 4 times daily May need to consider renal artery ultrasound  LVH, 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 Rate well controlled, working on blood pressure as  above  Type 2 diabetes with complications, renal dysfunction Hemoglobin A1c relatively well controlled  Chronic renal failure Likely secondary to chronic hypertension Creatinine 3.2 in November 2020 Now on hemodialysis 3 days a week Blood pressure running high  Anemia: Possibly secondary to chronic kidney disease Followed by nephrology  Smoker Smoking cessation recommended, smoking 3 cigarettes or less   Records reviewed Long discussion with her concerning echocardiogram findings, management of blood pressure, Coordinated blood pressure checks with the nursing facility and fax back to our office Long discussion concerning effects of hemodialysis and blood pressure  Total  encounter time more than 35 minutes  Greater than 50% was spent in counseling and coordination of care with the patient    Signed, Esmond Plants, M.D., Ph.D. [place], [place]

## 2020-08-29 ENCOUNTER — Ambulatory Visit (INDEPENDENT_AMBULATORY_CARE_PROVIDER_SITE_OTHER): Payer: Medicare Other | Admitting: Nurse Practitioner

## 2020-08-29 ENCOUNTER — Ambulatory Visit (INDEPENDENT_AMBULATORY_CARE_PROVIDER_SITE_OTHER): Payer: Medicare Other | Admitting: Cardiovascular Disease

## 2020-08-29 ENCOUNTER — Encounter: Payer: Self-pay | Admitting: Cardiovascular Disease

## 2020-08-29 ENCOUNTER — Ambulatory Visit (INDEPENDENT_AMBULATORY_CARE_PROVIDER_SITE_OTHER): Payer: Medicare Other

## 2020-08-29 ENCOUNTER — Other Ambulatory Visit: Payer: Self-pay

## 2020-08-29 VITALS — BP 168/50 | HR 50 | Ht 64.0 in | Wt 114.0 lb

## 2020-08-29 VITALS — BP 168/50 | HR 58 | Ht 64.0 in | Wt 115.0 lb

## 2020-08-29 DIAGNOSIS — I2510 Atherosclerotic heart disease of native coronary artery without angina pectoris: Secondary | ICD-10-CM | POA: Diagnosis not present

## 2020-08-29 DIAGNOSIS — N186 End stage renal disease: Secondary | ICD-10-CM

## 2020-08-29 DIAGNOSIS — I5020 Unspecified systolic (congestive) heart failure: Secondary | ICD-10-CM

## 2020-08-29 DIAGNOSIS — I10 Essential (primary) hypertension: Secondary | ICD-10-CM

## 2020-08-29 DIAGNOSIS — N189 Chronic kidney disease, unspecified: Secondary | ICD-10-CM | POA: Diagnosis not present

## 2020-08-29 DIAGNOSIS — G309 Alzheimer's disease, unspecified: Secondary | ICD-10-CM

## 2020-08-29 DIAGNOSIS — I132 Hypertensive heart and chronic kidney disease with heart failure and with stage 5 chronic kidney disease, or end stage renal disease: Secondary | ICD-10-CM | POA: Diagnosis not present

## 2020-08-29 DIAGNOSIS — N183 Chronic kidney disease, stage 3 unspecified: Secondary | ICD-10-CM

## 2020-08-29 DIAGNOSIS — Z794 Long term (current) use of insulin: Secondary | ICD-10-CM

## 2020-08-29 DIAGNOSIS — F0280 Dementia in other diseases classified elsewhere without behavioral disturbance: Secondary | ICD-10-CM

## 2020-08-29 DIAGNOSIS — E1122 Type 2 diabetes mellitus with diabetic chronic kidney disease: Secondary | ICD-10-CM

## 2020-08-29 MED ORDER — CLONIDINE HCL 0.2 MG PO TABS: 0.2000 mg | ORAL_TABLET | Freq: Three times a day (TID) | ORAL | 3 refills | Status: AC

## 2020-08-29 MED ORDER — LOSARTAN POTASSIUM 100 MG PO TABS: 100.0000 mg | ORAL_TABLET | Freq: Every day | ORAL | 3 refills | Status: AC

## 2020-08-29 MED ORDER — SIMVASTATIN 40 MG PO TABS: 40.0000 mg | ORAL_TABLET | Freq: Every day | ORAL | 3 refills | Status: AC

## 2020-08-29 MED ORDER — AMLODIPINE BESYLATE 10 MG PO TABS: 10.0000 mg | ORAL_TABLET | Freq: Every day | ORAL | 3 refills | Status: AC

## 2020-08-29 MED ORDER — METOPROLOL SUCCINATE ER 100 MG PO TB24: 100.0000 mg | ORAL_TABLET | Freq: Every day | ORAL | 3 refills | Status: AC

## 2020-08-29 MED ORDER — HYDRALAZINE HCL 50 MG PO TABS: 100.0000 mg | ORAL_TABLET | Freq: Three times a day (TID) | ORAL | 3 refills | Status: DC

## 2020-08-29 MED ORDER — ISOSORBIDE MONONITRATE ER 30 MG PO TB24: 30.0000 mg | ORAL_TABLET | Freq: Every day | ORAL | 3 refills | Status: DC

## 2020-08-29 NOTE — Patient Instructions (Addendum)
Medication Instructions:  Start   hydralazine 100 mg three times a day  imdur/isosrbide mononitrate 30 mg daily (take once a day)  Lab work: No new labs needed  Testing/Procedures: No new testing needed   Follow-Up: At [facility], you and your health needs are our priority.  As part of our continuing mission to provide you with exceptional heart care, we have created designated Provider Care Teams.  These Care Teams include your primary Cardiologist (physician) and Advanced Practice Providers (APPs -  Physician Assistants and Nurse Practitioners) who all work together to provide you with the care you need, when you need it.  . You will need a follow up appointment in 2 months  . Providers on your designated Care Team:   . Murray Hodgkins, NP . Christell Faith, PA-C . Marrianne Mood, PA-C  Any Other Special Instructions Will Be Listed Below (If Applicable).  COVID-19 Vaccine Information can be found at: [email or web address].co.uk For questions related to vaccine distribution or appointments, please email [email or web address] or call [phone].   Please monitor blood pressures and keep a log of your readings.  Please fax to the office in 2-3 weeks with BP readings.   How to use a home blood pressure monitor. . Be still. . Measure at the same time every day. It's important to take the readings at the same time each day, such as morning and evening. Take reading approximately 1 1/2 to 2 hours after BP medications.   AVOID these things for 30 minutes before checking your blood pressure:  Drinking caffeine.  Drinking alcohol.  Eating.  Smoking.  Exercising.   Five minutes before checking your blood pressure:  Pee.  Sit in a dining chair. Avoid sitting in a soft couch or armchair.  Be quiet. Do not talk.       Sit correctly. Sit with your back straight and supported (on a dining chair, rather than a sofa). Your  feet should be flat on the floor and your legs should not be crossed. Your arm should be supported on a flat surface (such as a table) with the upper arm at heart level. Make sure the bottom of the cuff is placed directly above the bend of the elbow.

## 2020-09-03 ENCOUNTER — Encounter (INDEPENDENT_AMBULATORY_CARE_PROVIDER_SITE_OTHER): Payer: Self-pay | Admitting: Nurse Practitioner

## 2020-09-03 NOTE — Progress Notes (Signed)
Subjective:    Patient ID: Alyssa [name], female    DOB: [birth year], 80 y.o.   MRN: [id number] Chief Complaint  Patient presents with  . Follow-up    3 WK [facility] post insertion of arteriovenous gore TX graft ARM     The patient presents today following placement of a left brachiocephalic AV fistula on 09/27/9516.  The patient has an attendant with her that helps with much of the history.  There has not been any swelling or discomfort from the arm.  She denies any fever, chills, nausea vomiting or diarrhea.  The catheter works well.  Overall she has done very well postsurgically.  Today the flow volume is 1415.  There is an elevated velocity near the anastomosis however it seems to taper down through the fistula.  The fistula is maturing nicely and has a very good thrill and bruit.   Review of Systems  Skin: Negative for wound.       Objective:   Physical Exam Vitals reviewed.  HENT:     Head: Normocephalic.  Cardiovascular:     Rate and Rhythm: Normal rate.     Pulses: Normal pulses.     Arteriovenous access: left arteriovenous access is present.    Comments: Good thrill and bruit Pulmonary:     Effort: Pulmonary effort is normal.  Neurological:     Mental Status: She is alert and oriented to person, place, and time.  Psychiatric:        Mood and Affect: Mood normal.        Speech: She is noncommunicative.        Behavior: Behavior is cooperative.        Thought Content: Thought content normal.        Cognition and Memory: Cognition is impaired.        Judgment: Judgment normal.     BP (!) 168/50   Pulse (!) 50   Ht $R'5\' 4"'gE$  (1.626 m)   Wt 114 lb (51.7 kg)   BMI 19.57 kg/m   Past Medical History:  Diagnosis Date  . Chronic systolic CHF (congestive heart failure) ([place])   . CKD (chronic kidney disease), stage III ([place])   . Dementia ([place])   . Essential hypertension   . Hyperlipidemia   . Hypertensive Non-ischemic Cardiomyopathy    a. 2010 Echo: EF 55%;  b. 2010  borderline MV w/ apical thinning; c. 2010 Cath: LCX 50p->Med Rx;  d. 09/2014 Echo: EF 35-40% w/ Sev LVH and glob HK;  e. 09/2014 MV: EF 30-44%, small fixed apical defect (breast attenuation), no ischemia.  . Hypokalemia   . Type II diabetes mellitus ([place])     Social History   Socioeconomic History  . Marital status: Widowed    Spouse name: Not on file  . Number of children: Not on file  . Years of education: Not on file  . Highest education level: Not on file  Occupational History  . Not on file  Tobacco Use  . Smoking status: Former Smoker    Packs/day: 0.25    Years: 10.00    Pack years: 2.50    Types: Cigarettes  . Smokeless tobacco: Former Systems developer    Types: Secondary school teacher  . Vaping Use: Never used  Substance and Sexual Activity  . Alcohol use: Not Currently    Alcohol/week: 1.0 standard drink    Types: 1 Cans of beer per week  . Drug use: No  . Sexual activity: Not  Currently  Other Topics Concern  . Not on file  Social History Narrative   Lives at [facility] assisted living   Social Determinants of Health   Financial Resource Strain: Not on file  Food Insecurity: Not on file  Transportation Needs: Not on file  Physical Activity: Not on file  Stress: Not on file  Social Connections: Not on file  Intimate Partner Violence: Not on file    Past Surgical History:  Procedure Laterality Date  . ABDOMINAL HYSTERECTOMY    . AV FISTULA PLACEMENT Left 08/04/2020   Procedure: INSERTION OF ARTERIOVENOUS (AV) GORE-TEX GRAFT ARM   ( BRACHIAL AXILLARY);  Surgeon: Katha Cabal, MD;  Location: [facility] ORS;  Service: Vascular;  Laterality: Left;  . CHOLECYSTECTOMY    . DIALYSIS/PERMA CATHETER INSERTION N/A 03/28/2020   Procedure: DIALYSIS/PERMA CATHETER INSERTION;  Surgeon: Katha Cabal, MD;  Location: [facility];  Service: Cardiovascular;  Laterality: N/A;  . TONSILLECTOMY      Family History  Problem Relation Age of Onset  . Diabetes Mother   .  Hypertension Mother   . Diabetes Father   . Hypertension Father   . Breast cancer Neg Hx     No Known Allergies  CBC Latest Ref Rng & Units 08/04/2020 08/02/2020 11/12/2017  WBC 4.0 - 10.5 K/uL - 4.3 5.5  Hemoglobin 12.0 - 15.0 g/dL 13.6 12.5 10.2(L)  Hematocrit 36.0 - 46.0 % 40.0 38.9 30.5(L)  Platelets 150 - 400 K/uL - 167 202      CMP     Component Value Date/Time   NA 136 08/04/2020 0800   NA 139 05/25/2018 1007   NA 138 10/28/2014 0105   K 4.5 08/04/2020 0800   K 3.7 10/28/2014 0105   CL 100 08/04/2020 0800   CL 106 10/28/2014 0105   CO2 31 08/02/2020 1509   CO2 26 10/28/2014 0105   GLUCOSE 149 (H) 08/04/2020 0800   GLUCOSE 212 (H) 10/28/2014 0105   BUN 24 (H) 08/04/2020 0800   BUN 17 05/25/2018 1007   BUN 15 10/28/2014 0105   CREATININE 3.50 (H) 08/04/2020 0800   CREATININE 1.12 (H) 10/28/2014 0105   CALCIUM 8.2 (L) 08/02/2020 1509   CALCIUM 8.3 (L) 10/28/2014 0105   PROT 6.7 05/25/2018 1007   PROT 6.4 (L) 10/27/2014 1623   ALBUMIN 3.8 05/25/2018 1007   ALBUMIN 3.1 (L) 10/27/2014 1623   AST 23 05/25/2018 1007   AST 30 10/27/2014 1623   ALT 9 05/25/2018 1007   ALT 19 10/27/2014 1623   ALKPHOS 63 05/25/2018 1007   ALKPHOS 69 10/27/2014 1623   BILITOT 0.2 05/25/2018 1007   BILITOT 0.7 10/27/2014 1623   GFRNONAA 40 (L) 08/02/2020 1509   GFRNONAA 49 (L) 10/28/2014 0105   GFRAA 31 (L) 05/25/2018 1007   GFRAA 57 (L) 10/28/2014 0105     No results found.     Assessment & Plan:   1. End stage renal disease ([place]) Recommend:  The patient is doing well and currently has adequate dialysis access. The patient's dialysis center is not reporting any access issues. Flow pattern is stable when compared to the prior ultrasound.  All of the patient's studies are reassuring today, it is not quite the minimal 6 weeks.  We will plan on allowing her to use her fistula on 09/19/2019 and we will send a letter to her dialysis center informing them of this.  The patient  should have a duplex ultrasound of the dialysis access  in 6 months. The patient will follow-up with me in the office after each ultrasound     2. Essential hypertension Continue antihypertensive medications as already ordered, these medications have been reviewed and there are no changes at this time.   3. Type 2 diabetes mellitus with stage 3 chronic kidney disease, with long-term current use of insulin, unspecified whether stage 3a or 3b CKD ([place]) Continue hypoglycemic medications as already ordered, these medications have been reviewed and there are no changes at this time.  Hgb A1C to be monitored as already arranged by primary service    Current Outpatient Medications on File Prior to Visit  Medication Sig Dispense Refill  . amLODipine (NORVASC) 10 MG tablet Take 1 tablet (10 mg total) by mouth daily. (0700) 90 tablet 3  . aspirin EC 81 MG tablet Take 81 mg by mouth daily. (0700)    . Blood Glucose Monitoring Suppl (TRUETRACK BLOOD GLUCOSE) w/Device KIT 1 each by Does not apply route daily. 1 each 0  . Calcium Carb-Cholecalciferol (CALCIUM 600+D3) 600-800 MG-UNIT TABS Take 1 tablet by mouth daily. (0700)    . cloNIDine (CATAPRES) 0.2 MG tablet Take 1 tablet (0.2 mg total) by mouth 3 (three) times daily. 273 tablet 3  . donepezil (ARICEPT) 10 MG tablet Take 10 mg by mouth at bedtime. (2000)    . ferrous sulfate 325 (65 FE) MG tablet Take 325 mg by mouth daily with breakfast. (0700)    . glipiZIDE (GLUCOTROL) 5 MG tablet Take 5 mg by mouth daily. (0700)    . GLOBAL INSULIN SYRINGES 30G X 5/16" 0.3 ML MISC USE ONCE A DAY FOR INSULIN 100 each 1  . glucose blood (RELION PRIME TEST) test strip Test twice a day 100 each 12  . hydrALAZINE (APRESOLINE) 50 MG tablet Take 2 tablets (100 mg total) by mouth 3 (three) times daily. 546 tablet 3  . HYDROcodone-acetaminophen (NORCO) 5-325 MG tablet Take 1-2 tablets by mouth every 6 (six) hours as needed. 20 tablet 0  . isosorbide mononitrate  (IMDUR) 30 MG 24 hr tablet Take 1 tablet (30 mg total) by mouth daily. 90 tablet 3  . LORazepam (ATIVAN) 0.5 MG tablet Take 0.25 mg by mouth 2 (two) times daily.    Marland Kitchen losartan (COZAAR) 100 MG tablet Take 1 tablet (100 mg total) by mouth daily. 90 tablet 3  . Melatonin 5 MG CAPS Take 2 capsules (10 mg total) by mouth at bedtime. (Patient taking differently: Take 5 mg by mouth at bedtime. (2000)) 60 capsule 5  . metoprolol succinate (TOPROL-XL) 100 MG 24 hr tablet Take 1 tablet (100 mg total) by mouth daily. 90 tablet 3  . mirtazapine (REMERON) 15 MG tablet Take 1 tablet (15 mg total) by mouth at bedtime. (Patient taking differently: Take 15 mg by mouth at bedtime. (2000)) 30 tablet 5  . omeprazole (PRILOSEC) 40 MG capsule TAKE ONE CAPSULE TWICE A DAY (Patient taking differently: Take 40 mg by mouth in the morning and at bedtime. (0700 & 1700)) 60 capsule 5  . polyethylene glycol powder (GLYCOLAX/MIRALAX) powder Take 17 g by mouth 3 (three) times a week. (Patient taking differently: Take 17 g by mouth 3 (three) times a week. (0700)) 225 g 5  . simvastatin (ZOCOR) 40 MG tablet Take 1 tablet (40 mg total) by mouth at bedtime. (2000) 90 tablet 3   No current facility-administered medications on file prior to visit.    There are no Patient Instructions on file for this visit.  No follow-ups on file.   Kris Hartmann, NP

## 2020-09-06 ENCOUNTER — Telehealth: Payer: Self-pay

## 2020-09-06 NOTE — Telephone Encounter (Signed)
Received notification that pt's scrip for Losartan 100 mg tab once daily was only being filled for temporary supply of 30 days then would need prior authorization for continue refill since medication is not formulary on pt's Medicare Part D plan. However, spoke with representative and she stated pt is approved for 30 day supply for one year with refills.  Reference number [id number] Member ID [id number] Losartan 100 mg tab 30 day supply with 11 refills

## 2020-09-26 ENCOUNTER — Telehealth (INDEPENDENT_AMBULATORY_CARE_PROVIDER_SITE_OTHER): Payer: Self-pay

## 2020-09-26 NOTE — Telephone Encounter (Signed)
Spoke with Elvis Coil at [facility] and the patient is scheduled with Dr. Delana Meyer on 10/03/20 for a permcath removal with a 2:00 pm arrival time to the MM. Covid testing on 09/28/20 between 8-1 pm at the [facility]. Pre-procedure instructions will be faxed to attn: Elvis Coil.

## 2020-09-28 ENCOUNTER — Other Ambulatory Visit
Admission: RE | Admit: 2020-09-28 | Discharge: 2020-09-28 | Disposition: A | Discharge status: Home / Self Care | Payer: Medicare Other | Source: Ambulatory Visit | Attending: Vascular Surgery | Admitting: Vascular Surgery

## 2020-09-28 ENCOUNTER — Other Ambulatory Visit: Payer: Self-pay

## 2020-09-28 DIAGNOSIS — Z20822 Contact with and (suspected) exposure to covid-19: Secondary | ICD-10-CM | POA: Diagnosis not present

## 2020-09-28 DIAGNOSIS — Z01812 Encounter for preprocedural laboratory examination: Secondary | ICD-10-CM | POA: Insufficient documentation

## 2020-09-28 LAB — SARS CORONAVIRUS 2 (TAT 6-24 HRS): SARS Coronavirus 2: NEGATIVE

## 2020-10-02 ENCOUNTER — Other Ambulatory Visit (INDEPENDENT_AMBULATORY_CARE_PROVIDER_SITE_OTHER): Payer: Self-pay | Admitting: Nurse Practitioner

## 2020-10-03 ENCOUNTER — Ambulatory Visit: Admission: RE | Admit: 2020-10-03 | Payer: Medicare Other | Source: Home / Self Care | Admitting: Vascular Surgery

## 2020-10-03 ENCOUNTER — Encounter: Admission: RE | Payer: Self-pay | Source: Home / Self Care

## 2020-10-03 DIAGNOSIS — N186 End stage renal disease: Secondary | ICD-10-CM

## 2020-10-03 SURGERY — DIALYSIS/PERMA CATHETER REMOVAL
Anesthesia: LOCAL

## 2020-10-10 ENCOUNTER — Telehealth (INDEPENDENT_AMBULATORY_CARE_PROVIDER_SITE_OTHER): Payer: Self-pay

## 2020-10-10 NOTE — Telephone Encounter (Signed)
Spoke with Alyssa [name] at [facility] patient is scheduled with Dr. Delana Meyer for a permcath removal on 10/17/20 with a 2:30 pm arrival time to the MM. Covid testing on 10/12/20 between 8-2 pm at the [facility]. Pre-procedure instructions will be faxed to Alyssa [name] and [name] at [facility].

## 2020-10-11 ENCOUNTER — Emergency Department
Admission: EM | Admit: 2020-10-11 | Discharge: 2020-10-11 | Disposition: A | Discharge status: Home / Self Care | Payer: Medicare Other | Attending: Emergency Medicine | Admitting: Emergency Medicine

## 2020-10-11 ENCOUNTER — Other Ambulatory Visit: Payer: Self-pay

## 2020-10-11 DIAGNOSIS — I2510 Atherosclerotic heart disease of native coronary artery without angina pectoris: Secondary | ICD-10-CM | POA: Diagnosis not present

## 2020-10-11 DIAGNOSIS — Z20822 Contact with and (suspected) exposure to covid-19: Secondary | ICD-10-CM | POA: Insufficient documentation

## 2020-10-11 DIAGNOSIS — Z79899 Other long term (current) drug therapy: Secondary | ICD-10-CM | POA: Diagnosis not present

## 2020-10-11 DIAGNOSIS — T8241XA Breakdown (mechanical) of vascular dialysis catheter, initial encounter: Secondary | ICD-10-CM | POA: Diagnosis not present

## 2020-10-11 DIAGNOSIS — D631 Anemia in chronic kidney disease: Secondary | ICD-10-CM | POA: Insufficient documentation

## 2020-10-11 DIAGNOSIS — F0280 Dementia in other diseases classified elsewhere without behavioral disturbance: Secondary | ICD-10-CM | POA: Insufficient documentation

## 2020-10-11 DIAGNOSIS — I132 Hypertensive heart and chronic kidney disease with heart failure and with stage 5 chronic kidney disease, or end stage renal disease: Secondary | ICD-10-CM | POA: Insufficient documentation

## 2020-10-11 DIAGNOSIS — Z992 Dependence on renal dialysis: Secondary | ICD-10-CM | POA: Insufficient documentation

## 2020-10-11 DIAGNOSIS — G309 Alzheimer's disease, unspecified: Secondary | ICD-10-CM | POA: Diagnosis not present

## 2020-10-11 DIAGNOSIS — Z7982 Long term (current) use of aspirin: Secondary | ICD-10-CM | POA: Diagnosis not present

## 2020-10-11 DIAGNOSIS — Y841 Kidney dialysis as the cause of abnormal reaction of the patient, or of later complication, without mention of misadventure at the time of the procedure: Secondary | ICD-10-CM | POA: Insufficient documentation

## 2020-10-11 DIAGNOSIS — Z7984 Long term (current) use of oral hypoglycemic drugs: Secondary | ICD-10-CM | POA: Insufficient documentation

## 2020-10-11 DIAGNOSIS — E1122 Type 2 diabetes mellitus with diabetic chronic kidney disease: Secondary | ICD-10-CM | POA: Diagnosis not present

## 2020-10-11 DIAGNOSIS — I5022 Chronic systolic (congestive) heart failure: Secondary | ICD-10-CM | POA: Insufficient documentation

## 2020-10-11 DIAGNOSIS — N186 End stage renal disease: Secondary | ICD-10-CM | POA: Insufficient documentation

## 2020-10-11 DIAGNOSIS — Z87891 Personal history of nicotine dependence: Secondary | ICD-10-CM | POA: Insufficient documentation

## 2020-10-11 LAB — RESP PANEL BY RT-PCR (FLU A&B, COVID) ARPGX2
Influenza A by PCR: NEGATIVE
Influenza B by PCR: NEGATIVE
SARS Coronavirus 2 by RT PCR: NEGATIVE

## 2020-10-11 NOTE — ED Notes (Signed)
[name] called for clarification on "catheter problem", with no response by facility.... [phone].

## 2020-10-11 NOTE — Discharge Instructions (Addendum)
The catheter broke while in dialysis but patient already had gotten a full dialysis session.  The vascular team should call you all tomorrow to get a appointment time to have it replaced tomorrow.  I also attached their number in case you do not hear from them but this will need to be replaced before her next dialysis session

## 2020-10-11 NOTE — ED Notes (Signed)
Alyssa [name] called x2 for clarification on catheter.  Per Alyssa [name], the patient was at Dialysis at [facility] at [facility] [place].  Pt goes every MWF. Per dialysis, something broke off the end of the dialysis catheter, but Alyssa [name] is unsure what it was.  RN to call [facility].

## 2020-10-11 NOTE — ED Triage Notes (Signed)
[facility], pt has a hx of dementia, report was given that as the tech was touching her catheter tip it broke off, it appears intact at this time and covered with paper tape. Pt having difficulty holding still to have her bp taken as she doesn't understand why it is tight on her arm

## 2020-10-11 NOTE — ED Notes (Signed)
No e-signature obtained due to patient having dementia and not oriented enough to sign.  Pt at baseline.  Legal Guardian ([place], with DSS) aware that patient was seen and treated.

## 2020-10-11 NOTE — ED Provider Notes (Signed)
[facility] Emergency Department Provider Note  ____________________________________________   Event Date/Time   First MD Initiated Contact with Patient 10/11/20 1546     (approximate)  I have reviewed the triage vital signs and the nursing notes.   HISTORY  Chief Complaint cath port broke off    HPI Alyssa [name] is a 80 y.o. female with cardiomyopathy, CKD, dementia who comes in for cath port broken off.  Patient has dementia as I am unable to obtain history from her.  Therefore HPI is limited due to patient's baseline dementia.  [name] who stated that the issue happened at dialysis.  We called dialysis who stated that the arterial line of the dialysis catheter the lumen broke and therefore the catheter can no longer be used and needs to be replaced.  Patient did get a full session of dialysis.          Past Medical History:  Diagnosis Date  . Chronic systolic CHF (congestive heart failure) ([place])   . CKD (chronic kidney disease), stage III ([place])   . Dementia ([place])   . Essential hypertension   . Hyperlipidemia   . Hypertensive Non-ischemic Cardiomyopathy    a. 2010 Echo: EF 55%;  b. 2010 borderline MV w/ apical thinning; c. 2010 Cath: LCX 50p->Med Rx;  d. 09/2014 Echo: EF 35-40% w/ Sev LVH and glob HK;  e. 09/2014 MV: EF 30-44%, small fixed apical defect (breast attenuation), no ischemia.  . Hypokalemia   . Type II diabetes mellitus [facility])     Patient Active Problem List   Diagnosis Date Noted  . End stage renal disease ([place]) 06/29/2020  . Complication of vascular access for dialysis 06/29/2020  . Secondary hyperparathyroidism of renal origin ([place]) 02/08/2020  . Anemia in chronic kidney disease 12/02/2019  . Benign hypertensive kidney disease with chronic kidney disease 12/02/2019  . Hypocalcemia 12/02/2019  . Proteinuria 12/02/2019  . Insomnia 06/29/2018  . Constipation 06/29/2018  . Tobacco use disorder 05/25/2018  .  Closed fracture of right distal humerus 04/15/2017  . History of falling 02/11/2017  . Hip pain, left 01/31/2017  . Cardiomyopathy due to hypertension, without heart failure ([place]) 09/23/2016  . Cardiac murmur 06/03/2016  . Type 2 diabetes mellitus with stage 3 chronic kidney disease, with long-term current use of insulin ([place]) 12/06/2015  . Hyperlipidemia associated with type 2 diabetes mellitus ([place])   . Alzheimer's dementia without behavioral disturbance ([place]) 03/07/2015  . Coronary artery disease involving native coronary artery of native heart without angina pectoris 12/02/2014  . Allergic rhinitis 11/15/2014  . Essential hypertension 11/15/2014  . Arthritis, degenerative 11/15/2014  . Hypokalemia 11/15/2014  . H/O gastrointestinal hemorrhage 03/28/2011    Past Surgical History:  Procedure Laterality Date  . ABDOMINAL HYSTERECTOMY    . AV FISTULA PLACEMENT Left 08/04/2020   Procedure: INSERTION OF ARTERIOVENOUS (AV) GORE-TEX GRAFT ARM   ( BRACHIAL AXILLARY);  Surgeon: Katha Cabal, MD;  Location: [facility] ORS;  Service: Vascular;  Laterality: Left;  . CHOLECYSTECTOMY    . DIALYSIS/PERMA CATHETER INSERTION N/A 03/28/2020   Procedure: DIALYSIS/PERMA CATHETER INSERTION;  Surgeon: Katha Cabal, MD;  Location: [facility];  Service: Cardiovascular;  Laterality: N/A;  . TONSILLECTOMY      Prior to Admission medications   Medication Sig Start Date End Date Taking? Authorizing Provider  amLODipine (NORVASC) 10 MG tablet Take 1 tablet (10 mg total) by mouth daily. (0700) 08/29/20   Minna Merritts, MD  aspirin EC 81 MG tablet Take 81 mg by mouth daily. (0700)    [provider]  Blood Glucose Monitoring Suppl (TRUETRACK BLOOD GLUCOSE) w/Device KIT 1 each by Does not apply route daily. 12/06/15   Glean Hess, MD  Calcium Carb-Cholecalciferol (CALCIUM 600+D3) 600-800 MG-UNIT TABS Take 1 tablet by mouth daily. (0700)    [provider]  cloNIDine  (CATAPRES) 0.2 MG tablet Take 1 tablet (0.2 mg total) by mouth 3 (three) times daily. 08/29/20   Minna Merritts, MD  donepezil (ARICEPT) 10 MG tablet Take 10 mg by mouth at bedtime. (2000)    [provider]  ferrous sulfate 325 (65 FE) MG tablet Take 325 mg by mouth daily with breakfast. (0700)    [provider]  glipiZIDE (GLUCOTROL) 5 MG tablet Take 5 mg by mouth daily. (0700)    [provider]  GLOBAL INSULIN SYRINGES 30G X 5/16" 0.3 ML MISC USE ONCE A DAY FOR INSULIN 12/06/17   Glean Hess, MD  glucose blood (RELION PRIME TEST) test strip Test twice a day 08/18/17   Glean Hess, MD  hydrALAZINE (APRESOLINE) 50 MG tablet Take 2 tablets (100 mg total) by mouth 3 (three) times daily. 08/29/20   Minna Merritts, MD  HYDROcodone-acetaminophen (NORCO) 5-325 MG tablet Take 1-2 tablets by mouth every 6 (six) hours as needed. 08/04/20   Schnier, Dolores Lory, MD  isosorbide mononitrate (IMDUR) 30 MG 24 hr tablet Take 1 tablet (30 mg total) by mouth daily. 08/29/20 11/27/20  Minna Merritts, MD  LORazepam (ATIVAN) 0.5 MG tablet Take 0.25 mg by mouth 2 (two) times daily.    [provider]  losartan (COZAAR) 100 MG tablet Take 1 tablet (100 mg total) by mouth daily. 08/29/20   Minna Merritts, MD  Melatonin 5 MG CAPS Take 2 capsules (10 mg total) by mouth at bedtime. Patient taking differently: Take 5 mg by mouth at bedtime. (2000) 06/29/18   Glean Hess, MD  metoprolol succinate (TOPROL-XL) 100 MG 24 hr tablet Take 1 tablet (100 mg total) by mouth daily. 08/29/20   Minna Merritts, MD  mirtazapine (REMERON) 15 MG tablet Take 1 tablet (15 mg total) by mouth at bedtime. Patient taking differently: Take 15 mg by mouth at bedtime. (2000) 02/16/18   Glean Hess, MD  omeprazole (PRILOSEC) 40 MG capsule TAKE ONE CAPSULE TWICE A DAY Patient taking differently: Take 40 mg by mouth in the morning and at bedtime. (0700 & 1700) 01/24/18   Glean Hess, MD   polyethylene glycol powder (GLYCOLAX/MIRALAX) powder Take 17 g by mouth 3 (three) times a week. Patient taking differently: Take 17 g by mouth 3 (three) times a week. (0700) 06/29/18   Glean Hess, MD  simvastatin (ZOCOR) 40 MG tablet Take 1 tablet (40 mg total) by mouth at bedtime. (2000) 08/29/20   Gollan, Kathlene November, MD    Allergies Patient has no known allergies.  Family History  Problem Relation Age of Onset  . Diabetes Mother   . Hypertension Mother   . Diabetes Father   . Hypertension Father   . Breast cancer Neg Hx     Social History Social History   Tobacco Use  . Smoking status: Former Smoker    Packs/day: 0.25    Years: 10.00    Pack years: 2.50    Types: Cigarettes  . Smokeless tobacco: Former Systems developer    Types: Secondary school teacher  .  Vaping Use: Never used  Substance Use Topics  . Alcohol use: Not Currently    Alcohol/week: 1.0 standard drink    Types: 1 Cans of beer per week  . Drug use: No      Review of Systems Constitutional: No fever/chills Eyes: No visual changes. ENT: No sore throat. Cardiovascular: Denies chest pain. Respiratory: Denies shortness of breath. Gastrointestinal: No abdominal pain.  No nausea, no vomiting.  No diarrhea.  No constipation. Genitourinary: Negative for dysuria. Musculoskeletal: Negative for back pain. Skin: Negative for rash. Neurological: Negative for headaches, focal weakness or numbness. All other ROS negative although patient is at some baseline dementia but denies any concerns ____________________________________________   PHYSICAL EXAM:  VITAL SIGNS: ED Triage Vitals  Enc Vitals Group     BP 10/11/20 1449 (!) 191/151     Pulse Rate 10/11/20 1449 72     Resp 10/11/20 1449 19     Temp 10/11/20 1449 99 F (37.2 C)     Temp Source 10/11/20 1449 Oral     SpO2 10/11/20 1449 100 %     Weight 10/11/20 1451 110 lb (49.9 kg)     Height 10/11/20 1451 _0  (1.6 m)     Head Circumference --      Peak Flow --       Pain Score 10/11/20 1450 0     Pain Loc --      Pain Edu? --      Excl. in [place]? --     Constitutional:  Well appearing and in no acute distress. Eyes: Conjunctivae are normal. EOMI. Head: Atraumatic. Nose: No congestion/rhinnorhea. Mouth/Throat: Mucous membranes are moist.   Neck: No stridor. Trachea Midline. FROM Cardiovascular: Normal rate, regular rhythm. Grossly normal heart sounds.  Good peripheral circulation.  Right chest wall catheter with a broken lumen but Placed over top to prevent infection. Respiratory: Normal respiratory effort.  No retractions. Lungs CTAB. Gastrointestinal: Soft and nontender. No distention. No abdominal bruits.  Musculoskeletal: No lower extremity tenderness nor edema.  No joint effusions. Neurologic:  Normal speech and language. No gross focal neurologic deficits are appreciated.  Skin:  Skin is warm, dry and intact. No rash noted. Psychiatric: Mood and affect are normal. Speech and behavior are normal. GU: Deferred   ____________________________________________    INITIAL IMPRESSION / ASSESSMENT AND PLAN / ED COURSE  Alyssa [name] was evaluated in Emergency Department on 10/11/2020 for the symptoms described in the history of present illness. She was evaluated in the context of the global COVID-19 pandemic, which necessitated consideration that the patient might be at risk for infection with the SARS-CoV-2 virus that causes COVID-19. Institutional protocols and algorithms that pertain to the evaluation of patients at risk for COVID-19 are in a state of rapid change based on information released by regulatory bodies including the CDC and federal and state organizations. These policies and algorithms were followed during the patient's care in the ED.    Patient is a well-appearing 80 year old who comes in with a catheter port broken patient was hypertensive but this is not accurate patient was moving around a lot due to her dementia not liking the pain of  the cough.  Patient just had dialysis done so I will suspicion for electrolyte abnormalities so do not think we need to recheck them now.  I did discuss with Dr. Serafina Royals from IR who stated I need to talk to vascular surgery.  Discussed with Dr. Delana Meyer.  Discussed with Dr. Delana Meyer who  stated that they would be able to do the catheter tomorrow and patient can go home today.  I explained that patient was from a facility.  I did give him the facility number that we got a hold of him at [phone]         ____________________________________________   FINAL CLINICAL IMPRESSION(S) / ED DIAGNOSES   Final diagnoses:  Hemodialysis catheter dysfunction, initial encounter [facility])      MEDICATIONS GIVEN DURING THIS VISIT:  Medications - No data to display   ED Discharge Orders    None       Note:  This document was prepared using Dragon voice recognition software and may include unintentional dictation errors.   Vanessa [place], MD 10/11/20 [phone]

## 2020-10-11 NOTE — ED Notes (Signed)
[facility] called for clarification that the patient DID receive a full treatment today which was confirmed.  Celeste with [facility] also confirmed that the patient does have a working upper left arm fistula that can be used if the new CVC is not placed today or tomorrow.

## 2020-10-11 NOTE — ED Notes (Signed)
[facility] called, and spoke with Anderson Malta, who informed RN that the very end piece of the clear guard broke off.  They re-capped it so it wouldn't be an "open end".  Once cap is unscrewed, full crack present in the end of the leure-lock.  Whole Catheter will need to be replaced per [facility] staff. MD updated and notified.

## 2020-10-12 ENCOUNTER — Other Ambulatory Visit (INDEPENDENT_AMBULATORY_CARE_PROVIDER_SITE_OTHER): Payer: Self-pay | Admitting: Nurse Practitioner

## 2020-10-12 ENCOUNTER — Telehealth (INDEPENDENT_AMBULATORY_CARE_PROVIDER_SITE_OTHER): Payer: Self-pay

## 2020-10-12 NOTE — Telephone Encounter (Signed)
Dr. Delana Meyer: her tunneled dialysis catheter is fractured and needs an exchange Thursday if feasible otherwise Friday Dr Nickolas Madrid gave [place] the contact # above Thanks    Spoke with Elvis Coil at [facility] and the patient is scheduled for 10/13/20 for a permcath exchange with Dr. Delana Meyer with a 3:00 pm arrival time to the MM. Covid testing was done on 10/11/20 at the ED. Pre-procedure instructions were discussed and will be faxed to the facility care of [facility]. Patient was not NPO so could not have her procedure today.

## 2020-10-13 ENCOUNTER — Other Ambulatory Visit: Payer: Self-pay

## 2020-10-13 ENCOUNTER — Encounter: Payer: Self-pay | Admitting: Vascular Surgery

## 2020-10-13 ENCOUNTER — Ambulatory Visit
Admission: RE | Admit: 2020-10-13 | Discharge: 2020-10-13 | Disposition: A | Discharge status: Nursing Home (Includes ICF) | Payer: Medicare Other | Attending: Vascular Surgery | Admitting: Vascular Surgery

## 2020-10-13 ENCOUNTER — Encounter
Admission: RE | Disposition: A | Discharge status: Nursing Home (Includes ICF) | Payer: Self-pay | Source: Home / Self Care | Attending: Vascular Surgery

## 2020-10-13 DIAGNOSIS — Z833 Family history of diabetes mellitus: Secondary | ICD-10-CM | POA: Diagnosis not present

## 2020-10-13 DIAGNOSIS — Z992 Dependence on renal dialysis: Secondary | ICD-10-CM

## 2020-10-13 DIAGNOSIS — F0390 Unspecified dementia without behavioral disturbance: Secondary | ICD-10-CM | POA: Insufficient documentation

## 2020-10-13 DIAGNOSIS — Z8249 Family history of ischemic heart disease and other diseases of the circulatory system: Secondary | ICD-10-CM | POA: Insufficient documentation

## 2020-10-13 DIAGNOSIS — T8249XA Other complication of vascular dialysis catheter, initial encounter: Secondary | ICD-10-CM | POA: Insufficient documentation

## 2020-10-13 DIAGNOSIS — E1122 Type 2 diabetes mellitus with diabetic chronic kidney disease: Secondary | ICD-10-CM | POA: Insufficient documentation

## 2020-10-13 DIAGNOSIS — Z87891 Personal history of nicotine dependence: Secondary | ICD-10-CM | POA: Diagnosis not present

## 2020-10-13 DIAGNOSIS — I120 Hypertensive chronic kidney disease with stage 5 chronic kidney disease or end stage renal disease: Secondary | ICD-10-CM | POA: Diagnosis not present

## 2020-10-13 DIAGNOSIS — T82898A Other specified complication of vascular prosthetic devices, implants and grafts, initial encounter: Secondary | ICD-10-CM | POA: Diagnosis not present

## 2020-10-13 DIAGNOSIS — N186 End stage renal disease: Secondary | ICD-10-CM | POA: Insufficient documentation

## 2020-10-13 DIAGNOSIS — Y841 Kidney dialysis as the cause of abnormal reaction of the patient, or of later complication, without mention of misadventure at the time of the procedure: Secondary | ICD-10-CM | POA: Insufficient documentation

## 2020-10-13 HISTORY — PX: DIALYSIS/PERMA CATHETER INSERTION: CATH118288

## 2020-10-13 LAB — POTASSIUM (ARMC VASCULAR LAB ONLY): Potassium (ARMC vascular lab): 3.7 (ref 3.5–5.1)

## 2020-10-13 LAB — GLUCOSE, CAPILLARY: Glucose-Capillary: 120 mg/dL — ABNORMAL HIGH (ref 70–99)

## 2020-10-13 SURGERY — DIALYSIS/PERMA CATHETER INSERTION
Anesthesia: Moderate Sedation

## 2020-10-13 MED ORDER — SODIUM CHLORIDE 0.9 % IV SOLN
Freq: Once | INTRAVENOUS | Status: DC
  Filled 2020-10-13: qty 2

## 2020-10-13 MED ORDER — MIDAZOLAM HCL 2 MG/ML PO SYRP: 8.0000 mg | ORAL_SOLUTION | Freq: Once | ORAL | Status: DC | PRN

## 2020-10-13 MED ORDER — CHLORHEXIDINE GLUCONATE CLOTH 2 % EX PADS
6.0000 | MEDICATED_PAD | Freq: Every day | CUTANEOUS | Status: DC
  Administered 2020-10-13: 6 via TOPICAL

## 2020-10-13 MED ORDER — FENTANYL CITRATE (PF) 100 MCG/2ML IJ SOLN
INTRAMUSCULAR | Status: DC | PRN
  Administered 2020-10-13: 25 ug via INTRAVENOUS

## 2020-10-13 MED ORDER — HYDROMORPHONE HCL 1 MG/ML IJ SOLN: 1.0000 mg | Freq: Once | INTRAMUSCULAR | Status: DC | PRN

## 2020-10-13 MED ORDER — MIDAZOLAM HCL 2 MG/2ML IJ SOLN
INTRAMUSCULAR | Status: AC
  Administered 2020-10-13: 1 mg
  Filled 2020-10-13: qty 2

## 2020-10-13 MED ORDER — HYDRALAZINE HCL 20 MG/ML IJ SOLN
INTRAMUSCULAR | Status: AC
  Filled 2020-10-13: qty 1

## 2020-10-13 MED ORDER — FAMOTIDINE 20 MG PO TABS: 40.0000 mg | ORAL_TABLET | Freq: Once | ORAL | Status: DC | PRN

## 2020-10-13 MED ORDER — ONDANSETRON HCL 4 MG/2ML IJ SOLN: 4.0000 mg | Freq: Four times a day (QID) | INTRAMUSCULAR | Status: DC | PRN

## 2020-10-13 MED ORDER — SODIUM CHLORIDE 0.9 % IV SOLN: INTRAVENOUS | Status: DC

## 2020-10-13 MED ORDER — CEFAZOLIN SODIUM-DEXTROSE 1-4 GM/50ML-% IV SOLN
1.0000 g | Freq: Once | INTRAVENOUS | Status: AC
  Administered 2020-10-13: 1 g via INTRAVENOUS

## 2020-10-13 MED ORDER — METHYLPREDNISOLONE SODIUM SUCC 125 MG IJ SOLR: 125.0000 mg | Freq: Once | INTRAMUSCULAR | Status: DC | PRN

## 2020-10-13 MED ORDER — HYDRALAZINE HCL 20 MG/ML IJ SOLN
10.0000 mg | Freq: Once | INTRAMUSCULAR | Status: AC
  Administered 2020-10-13: 10 mg via INTRAVENOUS

## 2020-10-13 MED ORDER — FENTANYL CITRATE (PF) 100 MCG/2ML IJ SOLN
INTRAMUSCULAR | Status: AC
  Filled 2020-10-13: qty 2

## 2020-10-13 MED ORDER — DIPHENHYDRAMINE HCL 50 MG/ML IJ SOLN: 50.0000 mg | Freq: Once | INTRAMUSCULAR | Status: DC | PRN

## 2020-10-13 SURGICAL SUPPLY — 6 items
BIOPATCH RED 1 DISK 7.0 (GAUZE/BANDAGES/DRESSINGS) ×2 IMPLANT
CATH PALIN MAXID VT KIT 19CM (CATHETERS) ×2 IMPLANT
GUIDEWIRE SUPER STIFF .035X180 (WIRE) ×2 IMPLANT
PACK ANGIOGRAPHY (CUSTOM PROCEDURE TRAY) ×2 IMPLANT
SUT MNCRL AB 4-0 PS2 18 (SUTURE) ×2 IMPLANT
SUT SILK 0 FSL (SUTURE) ×2 IMPLANT

## 2020-10-13 NOTE — Discharge Instructions (Signed)
Moderate Conscious Sedation, Adult, Care After This sheet gives you information about how to care for yourself after your procedure. Your health care provider may also give you more specific instructions. If you have problems or questions, contact your health care provider. What can I expect after the procedure? After the procedure, it is common to have:  Sleepiness for several hours.  Impaired judgment for several hours.  Difficulty with balance.  Vomiting if you eat too soon. Follow these instructions at home: For the time period you were told by your health care provider:  Rest.  Do not participate in activities where you could fall or become injured.  Do not drive or use machinery.  Do not drink alcohol.  Do not take sleeping pills or medicines that cause drowsiness.  Do not make important decisions or sign legal documents.  Do not take care of children on your own.      Eating and drinking  Follow the diet recommended by your health care provider.  Drink enough fluid to keep your urine pale yellow.  If you vomit: ? Drink water, juice, or soup when you can drink without vomiting. ? Make sure you have little or no nausea before eating solid foods.   General instructions  Take over-the-counter and prescription medicines only as told by your health care provider.  Have a responsible adult stay with you for the time you are told. It is important to have someone help care for you until you are awake and alert.  Do not smoke.  Keep all follow-up visits as told by your health care provider. This is important. Contact a health care provider if:  You are still sleepy or having trouble with balance after 24 hours.  You feel light-headed.  You keep feeling nauseous or you keep vomiting.  You develop a rash.  You have a fever.  You have redness or swelling around the IV site. Get help right away if:  You have trouble breathing.  You have new-onset confusion at  home. Summary  After the procedure, it is common to feel sleepy, have impaired judgment, or feel nauseous if you eat too soon.  Rest after you get home. Know the things you should not do after the procedure.  Follow the diet recommended by your health care provider and drink enough fluid to keep your urine pale yellow.  Get help right away if you have trouble breathing or new-onset confusion at home. This information is not intended to replace advice given to you by your health care provider. Make sure you discuss any questions you have with your health care provider. Document Revised: 11/12/2019 Document Reviewed: 06/10/2019 Elsevier Patient Education  2021 [employer] Tunneled Catheter Insertion, Care After This sheet gives you information about how to care for yourself after your procedure. Your health care provider may also give you more specific instructions. If you have problems or questions, contact your health care provider. What can I expect after the procedure? After the procedure, it is common to have:  Some mild redness, bruising, swelling, and pain around your catheter site.  A small amount of blood or clear fluid coming from your incisions. Follow these instructions at home: Incision care  Follow instructions from your health care provider about how to take care of your incisions. Make sure you: ? Wash your hands with soap and water before and after you change your bandages (dressings). If soap and water are not available, use hand sanitizer. ? Change your dressings   as told by your health care provider. Wash the area around your incisions with a germ-killing (antiseptic) solution when you change your dressings. ? Leave stitches (sutures), skin glue, or adhesive strips in place. These skin closures may need to stay in place for 2 weeks or longer. If adhesive strip edges start to loosen and curl up, you may trim the loose edges. Do not remove adhesive strips completely  unless your health care provider tells you to do that.  Keep your dressings clean and dry.  Check your incision areas every day for signs of infection. Check for: ? More redness, swelling, or pain. ? More fluid or blood. ? Warmth. ? Pus or a bad smell.   Catheter care  Wash your hands with soap and water before and after caring for your catheter. If soap and water are not available, use hand sanitizer.  Keep your catheter site clean and dry.  Apply an antibiotic ointment to your catheter site as told by your health care provider.  Flush your catheter as told by your health care provider. This helps prevent it from becoming clogged.  Do not open the caps on the ends of the catheter.  Do not pull on your catheter.   Medicines  Take over-the-counter and prescription medicines only as told by your health care provider.  If you were prescribed an antibiotic medicine, take it as told by your health care provider. Do not stop taking the antibiotic even if you start to feel better. Activity  Return to your normal activities as told by your health care provider. Ask your health care provider what activities are safe for you.  Follow any other activity restrictions as instructed by your health care provider.  Do not lift anything that is heavier than 10 lb (4.5 kg), or the limit that you are told, until your health care provider says that it is safe. Driving  Do not drive until your health care provider approves.  Ask your health care provider if the medicine prescribed to you requires you to avoid driving or using heavy machinery. General instructions  Follow your health care provider's specific instructions for the type of catheter that you have.  Do not take baths, swim, or use a hot tub until your health care provider approves. Ask your health care provider if you may take showers.  Keep all follow-up visits as told by your health care provider. This is important. Contact a  health care provider if:  You feel unusually weak or nauseous.  You have more redness, swelling, or pain at your incisions or around the area where your catheter is inserted.  Your catheter is not working properly.  You are unable to flush your catheter. Get help right away if:  Your catheter develops a hole or it breaks.  You have pain or swelling when fluids or medicines are being given through the catheter.  Fluid is leaking from the catheter, under the dressing, or around the dressing.  Your catheter comes loose or gets pulled completely out. If this happens, press on your catheter site firmly with a clean cloth until you can get medical help.  You have swelling in your shoulder, neck, chest, or face.  You have chest pain or difficulty breathing.  You feel dizzy or light-headed.  You have pus or a bad smell coming from your catheter site.  You have a fever or chills.  Your catheter site feels warm to the touch.  You develop bleeding from your catheter   or your insertion site, and your bleeding does not stop. Summary  After the procedure, it is common to have mild redness, swelling, and pain around your catheter site.  Return to your normal activities as told by your health care provider. Ask your health care provider what activities are safe for you.  Follow your health care provider's specific instructions for the type of catheter that you have.  Keep your catheter site and your dressings clean and dry.  Contact a health care provider if your catheter is not working properly. Get help right away if you have chest pain, fever, or difficulty breathing. This information is not intended to replace advice given to you by your health care provider. Make sure you discuss any questions you have with your health care provider. Document Revised: 07/07/2018 Document Reviewed: 07/07/2018 Elsevier Patient Education  2021 Elsevier Inc.  

## 2020-10-13 NOTE — Op Note (Signed)
OPERATIVE NOTE   PROCEDURE: 1. Insertion of tunneled dialysis catheter right IJ approach same venous access.  PRE-OPERATIVE DIAGNOSIS: Nonfunction of existing tunneled dialysis catheter, and stage renal disease requiring hemodialysis   POST-OPERATIVE DIAGNOSIS: Same SURGEON: [name]  ANESTHESIA: Conscious sedation was administered under my direct supervision by the interventional radiology RN.  IV Versed plus fentanyl were utilized. Continuous ECG, pulse oximetry and blood pressure was monitored throughout the entire procedure.  Conscious sedation was for a total of 27 minutes.  ESTIMATED BLOOD LOSS: Minimal cc  CONTRAST USED:  None  FLUOROSCOPY TIME: 0.2 minutes  INDICATIONS:   Alyssa [name] is a 80 y.o.y.o. female who presents with poor flow and nonfunction of the tunneled dialysis catheter.  Adequate dialysis has not been possible.  DESCRIPTION: After obtaining full informed written consent, the patient was positioned supine. The right neck and chest wall was prepped and draped in a sterile fashion. The cuff is localized and using blunt and sharp dissection it is freed from the surrounding adhesions.  The existing catheter is then transected proximal to the cuff.  The guidewire is advanced without difficulty under fluoroscopy.  Dilators are passed over the wire as needed and the tunneled dialysis catheter is fed into the central venous system without difficulty.  Under fluoroscopy the catheter tip positioned at the atrial caval junction.  Both lumens aspirate and flush easily. After verification of smooth contour with proper tip position under fluoroscopy the catheter is packed with 5000 units of heparin per lumen.  Catheter secured to the skin of the right chest wall with 0 silk. A sterile dressing is applied with a Biopatch.  COMPLICATIONS: None  CONDITION: [name] Office:  [phone]   10/13/2020,6:32 PM

## 2020-10-13 NOTE — H&P (Signed)
[facility] Admission History & Physical  MRN : [id number]  Alyssa [name] is a 80 y.o. (Sep 27, 1940) female who presents with chief complaint of scheduled PermCath exchange.  History of Present Illness:  I am asked to evaluate the patient by the dialysis center. The patient was sent here due to a fracture in her PermCath. Patient has known history of dementia and during dialysis will become combative.  The patient does have a functioning upper extremity dialysis access however due to her diagnosis of dementia / becoming combative during dialysis we are being asked to exchange her PermCath.  Of note: The patient has significant dementia and information from this consult was obtained through prior epic notation and clinical team members.  Current Facility-Administered Medications  Medication Dose Route Frequency Provider Last Rate Last Admin  . 0.9 %  sodium chloride infusion   Intravenous Continuous Kris Hartmann, NP 10 mL/hr at 10/13/20 1313 New Bag at 10/13/20 1313  . ceFAZolin (ANCEF) IVPB 1 g/50 mL premix  1 g Intravenous Once Eulogio Ditch E, NP 100 mL/hr at 10/13/20 1408 1 g at 10/13/20 1408  . [START ON 10/14/2020] Chlorhexidine Gluconate Cloth 2 % PADS 6 each  6 each Topical Q0600 Kris Hartmann, NP   6 each at 10/13/20 1314  . diphenhydrAMINE (BENADRYL) injection 50 mg  50 mg Intravenous Once PRN Kris Hartmann, NP      . famotidine (PEPCID) tablet 40 mg  40 mg Oral Once PRN Kris Hartmann, NP      . fentaNYL (SUBLIMAZE) 100 MCG/2ML injection           . gentamicin (GARAMYCIN) 80 mg in sodium chloride 0.9 % 500 mL irrigation   Irrigation Once Eulogio Ditch E, NP      . HYDROmorphone (DILAUDID) injection 1 mg  1 mg Intravenous Once PRN Eulogio Ditch E, NP      . methylPREDNISolone sodium succinate (SOLU-MEDROL) 125 mg/2 mL injection 125 mg  125 mg Intravenous Once PRN Eulogio Ditch E, NP      . midazolam (VERSED) 2 MG/ML syrup 8 mg  8 mg Oral Once PRN Kris Hartmann, NP      . ondansetron (ZOFRAN) injection 4 mg  4 mg Intravenous Q6H PRN Kris Hartmann, NP       Past Surgical History:  Procedure Laterality Date  . ABDOMINAL HYSTERECTOMY    . AV FISTULA PLACEMENT Left 08/04/2020   Procedure: INSERTION OF ARTERIOVENOUS (AV) GORE-TEX GRAFT ARM   ( BRACHIAL AXILLARY);  Surgeon: Katha Cabal, MD;  Location: [facility];  Service: Vascular;  Laterality: Left;  . CHOLECYSTECTOMY    . DIALYSIS/PERMA CATHETER INSERTION N/A 03/28/2020   Procedure: DIALYSIS/PERMA CATHETER INSERTION;  Surgeon: Katha Cabal, MD;  Location: [facility];  Service: Cardiovascular;  Laterality: N/A;  . TONSILLECTOMY     Social History Social History   Tobacco Use  . Smoking status: Former Smoker    Packs/day: 0.25    Years: 10.00    Pack years: 2.50    Types: Cigarettes  . Smokeless tobacco: Former Systems developer    Types: Secondary school teacher  . Vaping Use: Never used  Substance Use Topics  . Alcohol use: Not Currently    Alcohol/week: 1.0 standard drink    Types: 1 Cans of beer per week  . Drug use: No   Family History Family History  Problem Relation Age of Onset  . Diabetes Mother   .  Hypertension Mother   . Diabetes Father   . Hypertension Father   . Breast cancer Neg Hx   No family history of bleeding or clotting disorders, autoimmune disease or porphyria.  No Known Allergies  REVIEW OF SYSTEMS (Negative unless checked)  Constitutional: '[]'$ Weight loss  '[]'$ Fever  '[]'$ Chills Cardiac: '[]'$ Chest pain   '[]'$ Chest pressure   '[]'$ Palpitations   '[]'$ Shortness of breath when laying flat   '[]'$ Shortness of breath at rest   '[x]'$ Shortness of breath with exertion. Vascular:  '[]'$ Pain in legs with walking   '[]'$ Pain in legs at rest   '[]'$ Pain in legs when laying flat   '[]'$ Claudication   '[]'$ Pain in feet when walking  '[]'$ Pain in feet at rest  '[]'$ Pain in feet when laying flat   '[]'$ History of DVT   '[]'$ Phlebitis   '[]'$ Swelling in legs   '[]'$ Varicose veins   '[]'$ Non-healing ulcers Pulmonary:    '[]'$ Uses home oxygen   '[]'$ Productive cough   '[]'$ Hemoptysis   '[]'$ Wheeze  '[]'$ COPD   '[]'$ Asthma Neurologic:  '[]'$ Dizziness  '[]'$ Blackouts   '[]'$ Seizures   '[]'$ History of stroke   '[]'$ History of TIA  '[]'$ Aphasia   '[]'$ Temporary blindness   '[]'$ Dysphagia   '[]'$ Weakness or numbness in arms   '[]'$ Weakness or numbness in legs Musculoskeletal:  '[]'$ Arthritis   '[]'$ Joint swelling   '[]'$ Joint pain   '[]'$ Low back pain Hematologic:  '[]'$ Easy bruising  '[]'$ Easy bleeding   '[]'$ Hypercoagulable state   '[]'$ Anemic  '[]'$ Hepatitis Gastrointestinal:  '[]'$ Blood in stool   '[]'$ Vomiting blood  '[]'$ Gastroesophageal reflux/heartburn   '[]'$ Difficulty swallowing. Genitourinary:  '[]'$ Chronic kidney disease   '[]'$ Difficult urination  '[]'$ Frequent urination  '[]'$ Burning with urination   '[]'$ Blood in urine Skin:  '[]'$ Rashes   '[]'$ Ulcers   '[]'$ Wounds Psychological:  '[]'$ History of anxiety   '[]'$  History of major depression.  Unable to obtain due to patient's dementia  Physical Examination  Vitals:   10/13/20 1305  BP: (!) 172/58  Pulse: (!) 57  Resp: (!) 21  Temp: 98 F (36.7 C)  SpO2: 97%  Weight: 49.9 kg  Height: '5\' 3"'$  (1.6 m)   Body mass index is 19.49 kg/m. Gen: History of dementia, no acute distress Head: [place]/AT, No temporalis wasting. Prominent temp pulse not noted. Ear/Nose/Throat: Hearing grossly intact, nares w/o erythema or drainage, oropharynx w/o Erythema/Exudate,  Eyes: Conjunctiva clear, sclera non-icteric Neck: Trachea midline.  No JVD.  Pulmonary:  Good air movement, respirations not labored, no use of accessory muscles.  Cardiac: RRR, normal S1, S2. Vascular:  Vessel Right Left  Radial Palpable Palpable   Right PermCath: Intact clean and dry.  No signs of infection noted.  Gastrointestinal: soft, non-tender/non-distended. No guarding/reflex.  Musculoskeletal: M/S 5/5 throughout.  Extremities without ischemic changes.  No deformity or atrophy.  Neurologic: Sensation grossly intact in extremities.  Symmetrical.  Speech is fluent. Motor exam as listed  above. Psychiatric: Patient with dementia Dermatologic: No rashes or ulcers noted.  No cellulitis or open wounds. Lymph : No Cervical, Axillary, or Inguinal lymphadenopathy.  CBC Lab Results  Component Value Date   WBC 4.3 08/02/2020   HGB 13.6 08/04/2020   HCT 40.0 08/04/2020   MCV 94.9 08/02/2020   PLT 167 08/02/2020   BMET    Component Value Date/Time   NA 136 08/04/2020 0800   NA 139 05/25/2018 1007   NA 138 10/28/2014 0105   K 4.5 08/04/2020 0800   K 3.7 10/28/2014 0105   CL 100 08/04/2020 0800   CL 106 10/28/2014 0105   CO2 31 08/02/2020 1509   CO2  26 10/28/2014 0105   GLUCOSE 149 (H) 08/04/2020 0800   GLUCOSE 212 (H) 10/28/2014 0105   BUN 24 (H) 08/04/2020 0800   BUN 17 05/25/2018 1007   BUN 15 10/28/2014 0105   CREATININE 3.50 (H) 08/04/2020 0800   CREATININE 1.12 (H) 10/28/2014 0105   CALCIUM 8.2 (L) 08/02/2020 1509   CALCIUM 8.3 (L) 10/28/2014 0105   GFRNONAA 40 (L) 08/02/2020 1509   GFRNONAA 49 (L) 10/28/2014 0105   GFRAA 31 (L) 05/25/2018 1007   GFRAA 57 (L) 10/28/2014 0105   CrCl cannot be calculated (Patient's most recent lab result is older than the maximum 21 days allowed.).  COAG Lab Results  Component Value Date   INR 1.0 08/02/2020   INR 0.95 03/05/2016   INR 1.0 10/27/2014   Radiology No results found.  Assessment/Plan 1.  Complication dialysis device with thrombosis AV access:  Patient's right tunneled catheter is fractured and not functioning appropriately. The patient will undergo exchange of the catheter same venous access using interventional techniques.  The risks and benefits were described to the patient's power of attorney Richarda Blade.  All questions were answered.  We will move forward with a PermCath exchange. 2.  End-stage renal disease requiring hemodialysis:  Patient will continue dialysis therapy without further interruption if a successful exchange is not achieved then new site will be found for tunneled catheter placement.  Dialysis has already been arranged since the patient missed their previous session 3.  Hypertension:  Patient will continue medical management; nephrology is following no changes in oral medications. 4. Diabetes mellitus:  Glucose will be monitored and oral medications been held this morning once the patient has undergone the patient's procedure po intake will be reinitiated and again Accu-Cheks will be used to assess the blood glucose level and treat as needed. The patient will be restarted on the patient's usual hypoglycemic regime  Discussed with Dr. Mayme Genta, PA-C  10/13/2020 2:11 PM

## 2020-10-13 NOTE — Progress Notes (Signed)
[place] department of social services to obtain legal guardian information for this patient.  Richarda Blade, owner of [name] called and stated she was on the way to the hospital with this patient.  ADSS contact to return call to this nurse to obtain telephone consent with witness RN

## 2020-10-16 ENCOUNTER — Encounter: Payer: Self-pay | Admitting: Vascular Surgery

## 2020-10-17 ENCOUNTER — Ambulatory Visit: Admit: 2020-10-17 | Payer: Medicare Other | Admitting: Vascular Surgery

## 2020-10-17 DIAGNOSIS — N186 End stage renal disease: Secondary | ICD-10-CM

## 2020-10-17 SURGERY — DIALYSIS/PERMA CATHETER REMOVAL
Anesthesia: LOCAL

## 2020-10-30 NOTE — Progress Notes (Signed)
Cardiology Office Note  Date:  10/31/2020   ID:  Alyssa [name], Alyssa [name] 1941/06/06, MRN [id number]  PCP:  Orvis Brill, [facility]   Chief Complaint  Patient presents with  . 2 month follow up     "doing well." Medications reviewed by the patient's medication list.     HPI:  80 year old female with history of  CAD with cath in 2010 that showed minimal CAD,  DM2,  HLD,  migraines   [facility] October 25 2014 for hypertensive urgency and demand ischemia  [facility] on 10/27/14 with uncontrolled HTN with BP greater than 200/100 and elevated troponin and chest pain.  Alzheimer's dementia /insomnia Previous hospital admission 2016 Echo 2016 showed an EF of 35-40% with severe LVH and global hypokinesis likely hypertensive heart disease.  smoker She presents for HTN, cardiomyopathy  [facility]ives at [facility] ,  [place] [employer] today with caretaker who reports that she is doing very well Difficulty getting up from the chair to the exam table but able to do it with assistance  Denies any shortness of breath or chest pain Caretaker reports blood pressure has been relatively well controlled  Recent events discussed leading to hospitalization Problem with catheter leading to visit to the emergency room October 11, 2020 Fracture of permacath Sometimes during dialysis becomes combative, pulled out the catheter  EKG personally reviewed by myself on todays visit Shows sinus bradycardia rate 54 bpm diffuse T wave abnormality anterolateral leads, inferior leads, unable to exclude strain pattern from LVH  Recent studies reviewed Echo 12/2019 reviewed in detail 1. Left ventricular ejection fraction, by estimation, is 60 to 65%. The  left ventricle has normal function. The left ventricle has no regional  wall motion abnormalities. There is severe concentric left ventricular  hypertrophy. Left ventricular diastolic  parameters are consistent with Grade I diastolic dysfunction  (impaired  relaxation).  2. Right ventricular systolic function is normal. The right ventricular  size is normal.   Past medical history reviewed Notes indicating history of dementia/sundowning -previously seen by Psych nurse Notes indicating she is up late, wandering the halls and going into other residents rooms on ativan, seems to help her sleep  Myoview stress test June 2016 No ischemia  PMH:   has a past medical history of Chronic systolic CHF (congestive heart failure) ([place]), CKD (chronic kidney disease), stage III ([place]), Dementia ([place]), Essential hypertension, Hyperlipidemia, Hypertensive Non-ischemic Cardiomyopathy, Hypokalemia, and Type II diabetes mellitus ([place]).  PSH:    Past Surgical History:  Procedure Laterality Date  . ABDOMINAL HYSTERECTOMY    . AV FISTULA PLACEMENT Left 08/04/2020   Procedure: INSERTION OF ARTERIOVENOUS (AV) GORE-TEX GRAFT ARM   ( BRACHIAL AXILLARY);  Surgeon: Katha Cabal, MD;  Location: [facility] ORS;  Service: Vascular;  Laterality: Left;  . CHOLECYSTECTOMY    . DIALYSIS/PERMA CATHETER INSERTION N/A 03/28/2020   Procedure: DIALYSIS/PERMA CATHETER INSERTION;  Surgeon: Katha Cabal, MD;  Location: [place] [facility];  Service: Cardiovascular;  Laterality: N/A;  . DIALYSIS/PERMA CATHETER INSERTION N/A 10/13/2020   Procedure: DIALYSIS/PERMA CATHETER INSERTION;  Surgeon: Katha Cabal, MD;  Location: [place] [facility];  Service: Cardiovascular;  Laterality: N/A;  . TONSILLECTOMY      Current Outpatient Medications  Medication Sig Dispense Refill  . amLODipine (NORVASC) 10 MG tablet Take 1 tablet (10 mg total) by mouth daily. (0700) 90 tablet 3  . aspirin EC 81 MG tablet Take 81 mg by mouth daily. (0700)    .  Blood Glucose Monitoring Suppl (TRUETRACK BLOOD GLUCOSE) w/Device KIT 1 each by Does not apply route daily. 1 each 0  . Calcium Carb-Cholecalciferol (CALCIUM 600+D3) 600-800 MG-UNIT TABS Take 1 tablet by mouth daily. (0700)    .  cloNIDine (CATAPRES) 0.1 MG tablet Take 0.1 mg by mouth daily as needed (SBP freater than or equal to 180).    . cloNIDine (CATAPRES) 0.2 MG tablet Take 1 tablet (0.2 mg total) by mouth 3 (three) times daily. 273 tablet 3  . donepezil (ARICEPT) 10 MG tablet Take 10 mg by mouth at bedtime. (2000)    . ferrous sulfate 325 (65 FE) MG tablet Take 325 mg by mouth daily with breakfast. (0700)    . glipiZIDE (GLUCOTROL) 5 MG tablet Take 5 mg by mouth daily. (0700)    . GLOBAL INSULIN SYRINGES 30G X 5/16" 0.3 ML MISC USE ONCE A DAY FOR INSULIN 100 each 1  . glucose blood (RELION PRIME TEST) test strip Test twice a day 100 each 12  . hydrALAZINE (APRESOLINE) 100 MG tablet Take 100 mg by mouth 3 (three) times daily.    . isosorbide mononitrate (IMDUR) 60 MG 24 hr tablet Take 60 mg by mouth daily.    Marland Kitchen LORazepam (ATIVAN) 0.5 MG tablet Take 0.25 mg by mouth 2 (two) times daily.    Marland Kitchen losartan (COZAAR) 100 MG tablet Take 1 tablet (100 mg total) by mouth daily. 90 tablet 3  . Melatonin 5 MG CAPS Take 2 capsules (10 mg total) by mouth at bedtime. 60 capsule 5  . metoprolol succinate (TOPROL-XL) 100 MG 24 hr tablet Take 1 tablet (100 mg total) by mouth daily. 90 tablet 3  . mirtazapine (REMERON) 15 MG tablet Take 1 tablet (15 mg total) by mouth at bedtime. 30 tablet 5  . omeprazole (PRILOSEC) 40 MG capsule TAKE ONE CAPSULE TWICE A DAY 60 capsule 5  . polyethylene glycol powder (GLYCOLAX/MIRALAX) powder Take 17 g by mouth 3 (three) times a week. 225 g 5  . simvastatin (ZOCOR) 40 MG tablet Take 1 tablet (40 mg total) by mouth at bedtime. (2000) 90 tablet 3  . HYDROcodone-acetaminophen (NORCO) 5-325 MG tablet Take 1-2 tablets by mouth every 6 (six) hours as needed. (Patient not taking: No sig reported) 20 tablet 0   No current facility-administered medications for this visit.     Allergies:   Patient has no known allergies.   Social History:  The patient  reports that she has quit smoking. Her smoking use  included cigarettes. She has a 2.50 pack-year smoking history. She has quit using smokeless tobacco.  Her smokeless tobacco use included chew. She reports previous alcohol use of about 1.0 standard drink of alcohol per week. She reports that she does not use drugs.   Family History:   family history includes Diabetes in her father and mother; Hypertension in her father and mother.    Review of Systems: Review of Systems  Constitutional: Negative.   HENT: Negative.   Respiratory: Negative.   Cardiovascular: Negative.   Gastrointestinal: Negative.   Musculoskeletal: Negative.   Neurological: Negative.   Psychiatric/Behavioral: Negative.   All other systems reviewed and are negative.   PHYSICAL EXAM: VS:  BP (!) 130/50 (BP Location: Right Arm, Patient Position: Sitting, Cuff Size: Normal)   Pulse (!) 54   Ht '5\' 2"'  (1.575 m)   Wt 112 lb 8 oz (51 kg)   SpO2 94%   BMI 20.58 kg/m  , BMI Body mass index  is 20.58 kg/m. Constitutional: Alert, not oriented no distress.  HENT:  Head: Grossly normal Eyes:  no discharge. No scleral icterus.  Neck: No JVD, no carotid bruits  Cardiovascular: Regular rate and rhythm, 3/6 SEM LSB Pulmonary/Chest: Clear to auscultation bilaterally, no wheezes or rails Abdominal: Soft.  no distension.  no tenderness.  Musculoskeletal: Normal range of motion Neurological: Grossly normal, full exam not performed Skin: Skin warm and dry Psychiatric: Pleasant, confused   Recent Labs: 08/02/2020: Platelets 167 08/04/2020: BUN 24; Creatinine, Ser 3.50; Hemoglobin 13.6; Potassium 4.5; Sodium 136    Lipid Panel Lab Results  Component Value Date   CHOL 170 05/25/2018   HDL 46 05/25/2018   LDLCALC 97 05/25/2018   TRIG 135 05/25/2018      Wt Readings from Last 3 Encounters:  10/31/20 112 lb 8 oz (51 kg)  10/13/20 110 lb 0.2 oz (49.9 kg)  10/11/20 110 lb (49.9 kg)     ASSESSMENT AND PLAN:  Problem List Items Addressed This Visit      Cardiology Problems    Essential hypertension (Chronic)   Relevant Medications   isosorbide mononitrate (IMDUR) 60 MG 24 hr tablet   hydrALAZINE (APRESOLINE) 100 MG tablet   Coronary artery disease involving native coronary artery of native heart without angina pectoris (Chronic)   Relevant Medications   isosorbide mononitrate (IMDUR) 60 MG 24 hr tablet   hydrALAZINE (APRESOLINE) 100 MG tablet     Other   End stage renal disease (HCC)   Relevant Orders   EKG 12-Lead   Alzheimer's dementia without behavioral disturbance (HCC)    Other Visit Diagnoses    HFrEF (heart failure with reduced ejection fraction) (HCC)    -  Primary   Relevant Medications   isosorbide mononitrate (IMDUR) 60 MG 24 hr tablet   hydrALAZINE (APRESOLINE) 100 MG tablet   Other Relevant Orders   EKG 12-Lead   Hypertensive heart and chronic kidney disease with heart failure and with stage 5 chronic kidney disease, or end stage renal disease (HCC)       Relevant Medications   isosorbide mononitrate (IMDUR) 60 MG 24 hr tablet   hydrALAZINE (APRESOLINE) 100 MG tablet   Other Relevant Orders   EKG 12-Lead   Chronic kidney disease, unspecified CKD stage         Malignant hypertension Better controlled on current meds HD on M/Wed/FRi No changes to medications made  LVH, 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, causing murmur on exam Blood pressure relatively well controlled  Type 2 diabetes with complications, renal dysfunction Hemoglobin A1c relatively well controlled  Chronic renal failure Likely secondary to chronic hypertension on hemodialysis 3 days a week Blood pressure improved  Anemia: From chronic kidney disease Managed by nephrology  Smoker Smoking cessation recommended, smoking 3 cigarettes or less   Total encounter time more than 25 minutes  Greater than 50% was spent in counseling and coordination of care with the patient  Signed, Esmond Plants, M.D., Ph.D. [place],  [place]

## 2020-10-31 ENCOUNTER — Encounter: Payer: Self-pay | Admitting: Cardiovascular Disease

## 2020-10-31 ENCOUNTER — Other Ambulatory Visit: Payer: Self-pay

## 2020-10-31 ENCOUNTER — Ambulatory Visit (INDEPENDENT_AMBULATORY_CARE_PROVIDER_SITE_OTHER): Payer: Medicare Other | Admitting: Cardiovascular Disease

## 2020-10-31 VITALS — BP 130/50 | HR 54 | Ht 62.0 in | Wt 112.5 lb

## 2020-10-31 DIAGNOSIS — I5020 Unspecified systolic (congestive) heart failure: Secondary | ICD-10-CM | POA: Diagnosis not present

## 2020-10-31 DIAGNOSIS — N189 Chronic kidney disease, unspecified: Secondary | ICD-10-CM

## 2020-10-31 DIAGNOSIS — I132 Hypertensive heart and chronic kidney disease with heart failure and with stage 5 chronic kidney disease, or end stage renal disease: Secondary | ICD-10-CM

## 2020-10-31 DIAGNOSIS — N186 End stage renal disease: Secondary | ICD-10-CM | POA: Diagnosis not present

## 2020-10-31 DIAGNOSIS — G309 Alzheimer's disease, unspecified: Secondary | ICD-10-CM

## 2020-10-31 DIAGNOSIS — F0280 Dementia in other diseases classified elsewhere without behavioral disturbance: Secondary | ICD-10-CM

## 2020-10-31 DIAGNOSIS — I10 Essential (primary) hypertension: Secondary | ICD-10-CM

## 2020-10-31 DIAGNOSIS — I2510 Atherosclerotic heart disease of native coronary artery without angina pectoris: Secondary | ICD-10-CM

## 2020-10-31 NOTE — Patient Instructions (Addendum)
Medication Instructions:  No changes  If you need a refill on your cardiac medications before your next appointment, please call your pharmacy.    Lab work: No new labs needed   If you have labs (blood work) drawn today and your tests are completely normal, you will receive your results only by: Marland Kitchen MyChart Message (if you have MyChart) OR . A paper copy in the mail If you have any lab test that is abnormal or we need to change your treatment, we will call you to review the results.   Testing/Procedures: No new testing needed   Follow-Up: At [facility], you and your health needs are our priority.  As part of our continuing mission to provide you with exceptional heart care, we have created designated Provider Care Teams.  These Care Teams include your primary Cardiologist (physician) and Advanced Practice Providers (APPs -  Physician Assistants and Nurse Practitioners) who all work together to provide you with the care you need, when you need it.  . You will need a follow up appointment in 4,  APP ok months  . Providers on your designated Care Team:   . Murray Hodgkins, NP . Christell Faith, PA-C . Marrianne Mood, PA-C  Any Other Special Instructions Will Be Listed Below (If Applicable).  COVID-19 Vaccine Information can be found at: ShippingScam.co.uk For questions related to vaccine distribution or appointments, please email [email or web address] or call [phone].

## 2020-11-22 ENCOUNTER — Encounter: Payer: Self-pay | Admitting: Emergency Medicine

## 2020-11-22 ENCOUNTER — Other Ambulatory Visit: Payer: Self-pay

## 2020-11-22 ENCOUNTER — Emergency Department
Admission: EM | Admit: 2020-11-22 | Discharge: 2020-11-22 | Disposition: A | Discharge status: Home / Self Care | Payer: Medicare Other | Attending: Emergency Medicine | Admitting: Emergency Medicine

## 2020-11-22 DIAGNOSIS — I132 Hypertensive heart and chronic kidney disease with heart failure and with stage 5 chronic kidney disease, or end stage renal disease: Secondary | ICD-10-CM | POA: Insufficient documentation

## 2020-11-22 DIAGNOSIS — F0390 Unspecified dementia without behavioral disturbance: Secondary | ICD-10-CM | POA: Diagnosis not present

## 2020-11-22 DIAGNOSIS — Z951 Presence of aortocoronary bypass graft: Secondary | ICD-10-CM | POA: Insufficient documentation

## 2020-11-22 DIAGNOSIS — N186 End stage renal disease: Secondary | ICD-10-CM | POA: Diagnosis not present

## 2020-11-22 DIAGNOSIS — Z794 Long term (current) use of insulin: Secondary | ICD-10-CM | POA: Diagnosis not present

## 2020-11-22 DIAGNOSIS — E876 Hypokalemia: Secondary | ICD-10-CM

## 2020-11-22 DIAGNOSIS — Z7984 Long term (current) use of oral hypoglycemic drugs: Secondary | ICD-10-CM | POA: Insufficient documentation

## 2020-11-22 DIAGNOSIS — Z79899 Other long term (current) drug therapy: Secondary | ICD-10-CM | POA: Diagnosis not present

## 2020-11-22 DIAGNOSIS — R58 Hemorrhage, not elsewhere classified: Secondary | ICD-10-CM | POA: Diagnosis not present

## 2020-11-22 DIAGNOSIS — I2510 Atherosclerotic heart disease of native coronary artery without angina pectoris: Secondary | ICD-10-CM | POA: Insufficient documentation

## 2020-11-22 DIAGNOSIS — Z7982 Long term (current) use of aspirin: Secondary | ICD-10-CM | POA: Diagnosis not present

## 2020-11-22 DIAGNOSIS — Z87891 Personal history of nicotine dependence: Secondary | ICD-10-CM | POA: Diagnosis not present

## 2020-11-22 DIAGNOSIS — I5022 Chronic systolic (congestive) heart failure: Secondary | ICD-10-CM | POA: Diagnosis not present

## 2020-11-22 DIAGNOSIS — D689 Coagulation defect, unspecified: Secondary | ICD-10-CM | POA: Diagnosis present

## 2020-11-22 DIAGNOSIS — T82838A Hemorrhage of vascular prosthetic devices, implants and grafts, initial encounter: Secondary | ICD-10-CM | POA: Diagnosis not present

## 2020-11-22 DIAGNOSIS — D649 Anemia, unspecified: Secondary | ICD-10-CM | POA: Diagnosis not present

## 2020-11-22 DIAGNOSIS — I770 Arteriovenous fistula, acquired: Secondary | ICD-10-CM | POA: Diagnosis not present

## 2020-11-22 DIAGNOSIS — E1122 Type 2 diabetes mellitus with diabetic chronic kidney disease: Secondary | ICD-10-CM | POA: Diagnosis not present

## 2020-11-22 LAB — CBC WITH DIFFERENTIAL/PLATELET
Abs Immature Granulocytes: 0.02 10*3/uL (ref 0.00–0.07)
Basophils Absolute: 0 10*3/uL (ref 0.0–0.1)
Basophils Relative: 0 %
Eosinophils Absolute: 0.1 10*3/uL (ref 0.0–0.5)
Eosinophils Relative: 2 %
HCT: 26.6 % — ABNORMAL LOW (ref 36.0–46.0)
Hemoglobin: 8.6 g/dL — ABNORMAL LOW (ref 12.0–15.0)
Immature Granulocytes: 0 %
Lymphocytes Relative: 28 %
Lymphs Abs: 1.5 10*3/uL (ref 0.7–4.0)
MCH: 30.5 pg (ref 26.0–34.0)
MCHC: 32.3 g/dL (ref 30.0–36.0)
MCV: 94.3 fL (ref 80.0–100.0)
Monocytes Absolute: 0.4 10*3/uL (ref 0.1–1.0)
Monocytes Relative: 8 %
Neutro Abs: 3.2 10*3/uL (ref 1.7–7.7)
Neutrophils Relative %: 62 %
Platelets: 132 10*3/uL — ABNORMAL LOW (ref 150–400)
RBC: 2.82 MIL/uL — ABNORMAL LOW (ref 3.87–5.11)
RDW: 14.8 % (ref 11.5–15.5)
WBC: 5.2 10*3/uL (ref 4.0–10.5)
nRBC: 0 % (ref 0.0–0.2)

## 2020-11-22 LAB — PROTIME-INR
INR: 1.1 (ref 0.8–1.2)
Prothrombin Time: 14.5 seconds (ref 11.4–15.2)

## 2020-11-22 LAB — BASIC METABOLIC PANEL
Anion gap: 8 (ref 5–15)
BUN: 8 mg/dL (ref 8–23)
CO2: 30 mmol/L (ref 22–32)
Calcium: 7.8 mg/dL — ABNORMAL LOW (ref 8.9–10.3)
Chloride: 100 mmol/L (ref 98–111)
Creatinine, Ser: 2.09 mg/dL — ABNORMAL HIGH (ref 0.44–1.00)
GFR, Estimated: 24 mL/min — ABNORMAL LOW (ref >60)
Glucose, Bld: 137 mg/dL — ABNORMAL HIGH (ref 70–99)
Potassium: 2.5 mmol/L — CL (ref 3.5–5.1)
Sodium: 138 mmol/L (ref 135–145)

## 2020-11-22 LAB — TYPE AND SCREEN
ABO/RH(D): A POS
Antibody Screen: NEGATIVE

## 2020-11-22 LAB — MAGNESIUM: Magnesium: 1.5 mg/dL — ABNORMAL LOW (ref 1.7–2.4)

## 2020-11-22 MED ORDER — POTASSIUM CHLORIDE CRYS ER 20 MEQ PO TBCR
40.0000 meq | EXTENDED_RELEASE_TABLET | Freq: Once | ORAL | Status: AC
  Administered 2020-11-22: 40 meq via ORAL
  Filled 2020-11-22: qty 2

## 2020-11-22 MED ORDER — MAGNESIUM SULFATE 2 GM/50ML IV SOLN: 2.0000 g | Freq: Once | INTRAVENOUS | Status: DC

## 2020-11-22 NOTE — Consult Note (Signed)
[facility] Vascular Consult Note  MRN : [id number]  Alyssa [name] is a 80 y.o. ([birth year]) female who presents with chief complaint of  Chief Complaint  Patient presents with  . Coagulation Disorder  .  History of Present Illness:   I am asked to evaluate the patient by Dr. Tamala Julian in the emergency room.  Patient is a 80 year old woman with advanced dementia who is end-stage renal disease and currently maintained on hemodialysis.  She was brought from her nursing care facility to the emergency room earlier today after experiencing significant bleeding from her fistula.  She was dialyzed earlier today.  Patient had a left brachiocephalic fistula placed August 04, 2020 however a right IJ tunnel cath was exchanged in March 2018 2022.  The patient does have a functioning upper extremity dialysis access however due to her diagnosis of dementia / becoming combative during dialysis we are being asked to exchange her PermCath.  Current Facility-Administered Medications  Medication Dose Route Frequency Provider Last Rate Last Admin  . potassium chloride SA (KLOR-CON) CR tablet 40 mEq  40 mEq Oral Once Lucrezia Starch, MD       Current Outpatient Medications  Medication Sig Dispense Refill  . amLODipine (NORVASC) 10 MG tablet Take 1 tablet (10 mg total) by mouth daily. (0700) 90 tablet 3  . aspirin EC 81 MG tablet Take 81 mg by mouth daily. (0700)    . Blood Glucose Monitoring Suppl (TRUETRACK BLOOD GLUCOSE) w/Device KIT 1 each by Does not apply route daily. 1 each 0  . Calcium Carb-Cholecalciferol (CALCIUM 600+D3) 600-800 MG-UNIT TABS Take 1 tablet by mouth daily. (0700)    . cloNIDine (CATAPRES) 0.1 MG tablet Take 0.1 mg by mouth daily as needed (SBP freater than or equal to 180).    . cloNIDine (CATAPRES) 0.2 MG tablet Take 1 tablet (0.2 mg total) by mouth 3 (three) times daily. 273 tablet 3  . donepezil (ARICEPT) 10 MG tablet Take 10 mg by mouth at bedtime. (2000)     . ferrous sulfate 325 (65 FE) MG tablet Take 325 mg by mouth daily with breakfast. (0700)    . glipiZIDE (GLUCOTROL) 5 MG tablet Take 5 mg by mouth daily. (0700)    . GLOBAL INSULIN SYRINGES 30G X 5/16" 0.3 ML MISC USE ONCE A DAY FOR INSULIN 100 each 1  . glucose blood (RELION PRIME TEST) test strip Test twice a day 100 each 12  . hydrALAZINE (APRESOLINE) 100 MG tablet Take 100 mg by mouth 3 (three) times daily.    Marland Kitchen HYDROcodone-acetaminophen (NORCO) 5-325 MG tablet Take 1-2 tablets by mouth every 6 (six) hours as needed. (Patient not taking: No sig reported) 20 tablet 0  . isosorbide mononitrate (IMDUR) 60 MG 24 hr tablet Take 60 mg by mouth daily.    Marland Kitchen LORazepam (ATIVAN) 0.5 MG tablet Take 0.25 mg by mouth 2 (two) times daily.    Marland Kitchen losartan (COZAAR) 100 MG tablet Take 1 tablet (100 mg total) by mouth daily. 90 tablet 3  . Melatonin 5 MG CAPS Take 2 capsules (10 mg total) by mouth at bedtime. 60 capsule 5  . metoprolol succinate (TOPROL-XL) 100 MG 24 hr tablet Take 1 tablet (100 mg total) by mouth daily. 90 tablet 3  . mirtazapine (REMERON) 15 MG tablet Take 1 tablet (15 mg total) by mouth at bedtime. 30 tablet 5  . omeprazole (PRILOSEC) 40 MG capsule TAKE ONE CAPSULE TWICE A DAY 60 capsule 5  .  polyethylene glycol powder (GLYCOLAX/MIRALAX) powder Take 17 g by mouth 3 (three) times a week. 225 g 5  . simvastatin (ZOCOR) 40 MG tablet Take 1 tablet (40 mg total) by mouth at bedtime. (2000) 90 tablet 3    Past Medical History:  Diagnosis Date  . Chronic systolic CHF (congestive heart failure) ([place])   . CKD (chronic kidney disease), stage III ([place])   . Dementia ([place])   . Essential hypertension   . Hyperlipidemia   . Hypertensive Non-ischemic Cardiomyopathy    a. 2010 Echo: EF 55%;  b. 2010 borderline MV w/ apical thinning; c. 2010 Cath: LCX 50p->Med Rx;  d. 09/2014 Echo: EF 35-40% w/ Sev LVH and glob HK;  e. 09/2014 MV: EF 30-44%, small fixed apical defect (breast attenuation), no ischemia.  .  Hypokalemia   . Type II diabetes mellitus ([place])     Past Surgical History:  Procedure Laterality Date  . ABDOMINAL HYSTERECTOMY    . AV FISTULA PLACEMENT Left 08/04/2020   Procedure: INSERTION OF ARTERIOVENOUS (AV) GORE-TEX GRAFT ARM   ( BRACHIAL AXILLARY);  Surgeon: Katha Cabal, MD;  Location: [facility] ORS;  Service: Vascular;  Laterality: Left;  . CHOLECYSTECTOMY    . DIALYSIS/PERMA CATHETER INSERTION N/A 03/28/2020   Procedure: DIALYSIS/PERMA CATHETER INSERTION;  Surgeon: Katha Cabal, MD;  Location: [facility];  Service: Cardiovascular;  Laterality: N/A;  . DIALYSIS/PERMA CATHETER INSERTION N/A 10/13/2020   Procedure: DIALYSIS/PERMA CATHETER INSERTION;  Surgeon: Katha Cabal, MD;  Location: [facility];  Service: Cardiovascular;  Laterality: N/A;  . TONSILLECTOMY      Social History Social History   Tobacco Use  . Smoking status: Former Smoker    Packs/day: 0.25    Years: 10.00    Pack years: 2.50    Types: Cigarettes  . Smokeless tobacco: Former Systems developer    Types: Secondary school teacher  . Vaping Use: Never used  Substance Use Topics  . Alcohol use: Not Currently    Alcohol/week: 1.0 standard drink    Types: 1 Cans of beer per week  . Drug use: No    Family History Family History  Problem Relation Age of Onset  . Diabetes Mother   . Hypertension Mother   . Diabetes Father   . Hypertension Father   . Breast cancer Neg Hx   No family history of bleeding/clotting disorders, porphyria or autoimmune disease   No Known Allergies   REVIEW OF SYSTEMS (patient with advanced dementia and unable to give a review of systems)   Physical Examination  Vitals:   11/22/20 1503 11/22/20 1504  Temp: 98.1 F (36.7 C)   TempSrc: Oral   SpO2: 99%   Weight:  60 kg   Body mass index is 24.19 kg/m.  Head: [place]/AT, No temporalis wasting.  Ear/Nose/Throat: Nares w/o  drainage, oropharynx w/o obsrtuction, Mallampati score: 3.  Dentition poor.  Eyes:  PER, Sclera nonicteric.  Neck: Supple,  No bruit or JVD.  Pulmonary: No audible wheezing, no use of accessory muscles.  Cardiac: RRR,  Vascular: Right IJ tunnel catheter clean dry and intact, left brachiocephalic fistula good thrill good bruit minimal pulsatility Gastrointestinal: soft, non-tender, non-distended.  Musculoskeletal: Moves all extremities.  No deformity or atrophy. No edema. Neurologic: Alert broken speech.  Psychiatric: Judgment poor, Mood & affect appropriate for pt's clinical situation. Dermatologic: No rashes or ulcers noted.  No cellulitis or open wounds.      CBC Lab Results  Component Value Date  WBC 5.2 11/22/2020   HGB 8.6 (L) 11/22/2020   HCT 26.6 (L) 11/22/2020   MCV 94.3 11/22/2020   PLT 132 (L) 11/22/2020    BMET    Component Value Date/Time   NA 138 11/22/2020 1456   NA 139 05/25/2018 1007   NA 138 10/28/2014 0105   K 2.5 (LL) 11/22/2020 1456   K 3.7 10/28/2014 0105   CL 100 11/22/2020 1456   CL 106 10/28/2014 0105   CO2 30 11/22/2020 1456   CO2 26 10/28/2014 0105   GLUCOSE 137 (H) 11/22/2020 1456   GLUCOSE 212 (H) 10/28/2014 0105   BUN 8 11/22/2020 1456   BUN 17 05/25/2018 1007   BUN 15 10/28/2014 0105   CREATININE 2.09 (H) 11/22/2020 1456   CREATININE 1.12 (H) 10/28/2014 0105   CALCIUM 7.8 (L) 11/22/2020 1456   CALCIUM 8.3 (L) 10/28/2014 0105   GFRNONAA 24 (L) 11/22/2020 1456   GFRNONAA 49 (L) 10/28/2014 0105   GFRAA 31 (L) 05/25/2018 1007   GFRAA 57 (L) 10/28/2014 0105   Estimated Creatinine Clearance: 17.8 mL/min (A) (by C-G formula based on SCr of 2.09 mg/dL (H)).  COAG Lab Results  Component Value Date   INR 1.1 11/22/2020   INR 1.0 08/02/2020   INR 0.95 03/05/2016     Assessment/Plan 1. End stage renal disease ([place]) Recommend:  The patient should receive dialysis via her tunneled catheter.  I will plan to see her back in the office in the next week or so and perform a duplex ultrasound of her AV fistula.  She may  need a fistulogram.  In the meantime her catheter is functioning well and given her combative nature at dialysis would seem to be the better choice.   2. Essential hypertension Continue antihypertensive medications as already ordered, these medications have been reviewed and there are no changes at this time.   3. Type 2 diabetes mellitus with stage 3 chronic kidney disease, with long-term current use of insulin, unspecified whether stage 3a or 3b CKD ([place]) Continue hypoglycemic medications as already ordered, these medications have been reviewed and there are no changes at this time.  Hgb A1C to be monitored as already arranged by primary service   [name], MD  11/22/2020 3:54 PM

## 2020-11-22 NOTE — ED Triage Notes (Signed)
[facility] fistula bleeding. Bleeding controlled on arrival to ed.

## 2020-11-22 NOTE — ED Provider Notes (Signed)
[facility] Emergency Department Provider Note  ____________________________________________   Event Date/Time   First MD Initiated Contact with Patient 11/22/20 1454     (approximate)  I have reviewed the triage vital signs and the nursing notes.   HISTORY  Chief Complaint Coagulation Disorder   HPI Alyssa [name] is a 80 y.o. female with a past medical history of HTN, HDL, DM, CKD, CHF, and dementia who presents via EMS from nursing facility after she developed some bleeding from her left upper extremity fistula following dialysis treatment today.  Patient reportedly receives dialysis Monday Wednesday Friday and received a full session today without any recently missed sessions.  She was reportedly little confused when EMS arrived and is a little confused on arrival although does endorse bleeding from her fistula this afternoon and denies any other acute complaints.  She does not recall any injuries or falls.  No chest pain, Donnell pain, headache or earache, sore throat or any other acute sick symptoms.  Per EMS prior to transfer tourniquet was applied to the septum for approximately 15 minutes.  Patient was taken down she had no recurrence of bleeding.         Past Medical History:  Diagnosis Date  . Chronic systolic CHF (congestive heart failure) ([place])   . CKD (chronic kidney disease), stage III ([place])   . Dementia ([place])   . Essential hypertension   . Hyperlipidemia   . Hypertensive Non-ischemic Cardiomyopathy    a. 2010 Echo: EF 55%;  b. 2010 borderline MV w/ apical thinning; c. 2010 Cath: LCX 50p->Med Rx;  d. 09/2014 Echo: EF 35-40% w/ Sev LVH and glob HK;  e. 09/2014 MV: EF 30-44%, small fixed apical defect (breast attenuation), no ischemia.  . Hypokalemia   . Type II diabetes mellitus [facility])     Patient Active Problem List   Diagnosis Date Noted  . End stage renal disease ([place]) 06/29/2020  . Complication of vascular access for dialysis 06/29/2020   . Secondary hyperparathyroidism of renal origin ([place]) 02/08/2020  . Anemia in chronic kidney disease 12/02/2019  . Benign hypertensive kidney disease with chronic kidney disease 12/02/2019  . Hypocalcemia 12/02/2019  . Proteinuria 12/02/2019  . Insomnia 06/29/2018  . Constipation 06/29/2018  . Tobacco use disorder 05/25/2018  . Closed fracture of right distal humerus 04/15/2017  . History of falling 02/11/2017  . Hip pain, left 01/31/2017  . Cardiomyopathy due to hypertension, without heart failure ([place]) 09/23/2016  . Cardiac murmur 06/03/2016  . Type 2 diabetes mellitus with stage 3 chronic kidney disease, with long-term current use of insulin ([place]) 12/06/2015  . Hyperlipidemia associated with type 2 diabetes mellitus ([place])   . Alzheimer's dementia without behavioral disturbance ([place]) 03/07/2015  . Coronary artery disease involving native coronary artery of native heart without angina pectoris 12/02/2014  . Allergic rhinitis 11/15/2014  . Essential hypertension 11/15/2014  . Arthritis, degenerative 11/15/2014  . Hypokalemia 11/15/2014  . H/O gastrointestinal hemorrhage 03/28/2011    Past Surgical History:  Procedure Laterality Date  . ABDOMINAL HYSTERECTOMY    . AV FISTULA PLACEMENT Left 08/04/2020   Procedure: INSERTION OF ARTERIOVENOUS (AV) GORE-TEX GRAFT ARM   ( BRACHIAL AXILLARY);  Surgeon: Katha Cabal, MD;  Location: [facility] ORS;  Service: Vascular;  Laterality: Left;  . CHOLECYSTECTOMY    . DIALYSIS/PERMA CATHETER INSERTION N/A 03/28/2020   Procedure: DIALYSIS/PERMA CATHETER INSERTION;  Surgeon: Katha Cabal, MD;  Location: [place] CV LAB;  Service: Cardiovascular;  Laterality: N/A;  .  DIALYSIS/PERMA CATHETER INSERTION N/A 10/13/2020   Procedure: DIALYSIS/PERMA CATHETER INSERTION;  Surgeon: Katha Cabal, MD;  Location: [place] CV LAB;  Service: Cardiovascular;  Laterality: N/A;  . TONSILLECTOMY      Prior to Admission medications   Medication Sig  Start Date End Date Taking? Authorizing Provider  amLODipine (NORVASC) 10 MG tablet Take 1 tablet (10 mg total) by mouth daily. (0700) 08/29/20   Minna Merritts, MD  aspirin EC 81 MG tablet Take 81 mg by mouth daily. (0700)    [provider]  Blood Glucose Monitoring Suppl (TRUETRACK BLOOD GLUCOSE) w/Device KIT 1 each by Does not apply route daily. 12/06/15   Glean Hess, MD  Calcium Carb-Cholecalciferol (CALCIUM 600+D3) 600-800 MG-UNIT TABS Take 1 tablet by mouth daily. (0700)    [provider]  cloNIDine (CATAPRES) 0.1 MG tablet Take 0.1 mg by mouth daily as needed (SBP freater than or equal to 180).    [provider]  cloNIDine (CATAPRES) 0.2 MG tablet Take 1 tablet (0.2 mg total) by mouth 3 (three) times daily. 08/29/20   Minna Merritts, MD  donepezil (ARICEPT) 10 MG tablet Take 10 mg by mouth at bedtime. (2000)    [provider]  ferrous sulfate 325 (65 FE) MG tablet Take 325 mg by mouth daily with breakfast. (0700)    [provider]  glipiZIDE (GLUCOTROL) 5 MG tablet Take 5 mg by mouth daily. (0700)    [provider]  GLOBAL INSULIN SYRINGES 30G X 5/16" 0.3 ML MISC USE ONCE A DAY FOR INSULIN 12/06/17   Glean Hess, MD  glucose blood (RELION PRIME TEST) test strip Test twice a day 08/18/17   Glean Hess, MD  hydrALAZINE (APRESOLINE) 100 MG tablet Take 100 mg by mouth 3 (three) times daily. 10/23/20   [provider]  HYDROcodone-acetaminophen (NORCO) 5-325 MG tablet Take 1-2 tablets by mouth every 6 (six) hours as needed. Patient not taking: No sig reported 08/04/20   Schnier, Dolores Lory, MD  isosorbide mononitrate (IMDUR) 60 MG 24 hr tablet Take 60 mg by mouth daily. 10/23/20   [provider]  LORazepam (ATIVAN) 0.5 MG tablet Take 0.25 mg by mouth 2 (two) times daily.    [provider]  losartan (COZAAR) 100 MG tablet Take 1 tablet (100 mg total) by mouth daily. 08/29/20   Minna Merritts, MD   Melatonin 5 MG CAPS Take 2 capsules (10 mg total) by mouth at bedtime. 06/29/18   Glean Hess, MD  metoprolol succinate (TOPROL-XL) 100 MG 24 hr tablet Take 1 tablet (100 mg total) by mouth daily. 08/29/20   Minna Merritts, MD  mirtazapine (REMERON) 15 MG tablet Take 1 tablet (15 mg total) by mouth at bedtime. 02/16/18   Glean Hess, MD  omeprazole (PRILOSEC) 40 MG capsule TAKE ONE CAPSULE TWICE A DAY 01/24/18   Glean Hess, MD  polyethylene glycol powder (GLYCOLAX/MIRALAX) powder Take 17 g by mouth 3 (three) times a week. 06/29/18   Glean Hess, MD  simvastatin (ZOCOR) 40 MG tablet Take 1 tablet (40 mg total) by mouth at bedtime. (2000) 08/29/20   Gollan, Kathlene November, MD    Allergies Patient has no known allergies.  Family History  Problem Relation Age of Onset  . Diabetes Mother   . Hypertension Mother   . Diabetes Father   . Hypertension Father   . Breast cancer Neg Hx     Social History Social  History   Tobacco Use  . Smoking status: Former Smoker    Packs/day: 0.25    Years: 10.00    Pack years: 2.50    Types: Cigarettes  . Smokeless tobacco: Former Systems developer    Types: Secondary school teacher  . Vaping Use: Never used  Substance Use Topics  . Alcohol use: Not Currently    Alcohol/week: 1.0 standard drink    Types: 1 Cans of beer per week  . Drug use: No    Review of Systems  Review of Systems  Unable to perform ROS: Dementia      ____________________________________________   PHYSICAL EXAM:  VITAL SIGNS: ED Triage Vitals  Enc Vitals Group     BP      Pulse      Resp      Temp      Temp src      SpO2      Weight      Height      Head Circumference      Peak Flow      Pain Score      Pain Loc      Pain Edu?      Excl. in [place]?    Vitals:   11/22/20 1503  Temp: 98.1 F (36.7 C)  SpO2: 99%   Physical Exam Vitals and nursing note reviewed.  Constitutional:      General: She is not in acute distress.    Appearance: She is  well-developed.  HENT:     Head: Normocephalic and atraumatic.     Right Ear: External ear normal.     Left Ear: External ear normal.     Nose: Nose normal.  Eyes:     Conjunctiva/sclera: Conjunctivae normal.  Cardiovascular:     Rate and Rhythm: Normal rate and regular rhythm.     Heart sounds: No murmur heard.   Pulmonary:     Effort: Pulmonary effort is normal. No respiratory distress.     Breath sounds: Normal breath sounds.  Abdominal:     Palpations: Abdomen is soft.     Tenderness: There is no abdominal tenderness.  Musculoskeletal:     Cervical back: Neck supple.  Skin:    General: Skin is warm and dry.  Neurological:     Mental Status: She is alert. Mental status is at baseline. She is disoriented.  Psychiatric:        Mood and Affect: Mood normal.     Left upper extremity AV fistula is in place with palpable thrill.  No active bleeding.  There is also a right chest tunneled cath in place. ____________________________________________   LABS (all labs ordered are listed, but only abnormal results are displayed)  Labs Reviewed  CBC WITH DIFFERENTIAL/PLATELET - Abnormal; Notable for the following components:      Result Value   RBC 2.82 (*)    Hemoglobin 8.6 (*)    HCT 26.6 (*)    Platelets 132 (*)    All other components within normal limits  BASIC METABOLIC PANEL - Abnormal; Notable for the following components:   Potassium 2.5 (*)    Glucose, Bld 137 (*)    Creatinine, Ser 2.09 (*)    Calcium 7.8 (*)    GFR, Estimated 24 (*)    All other components within normal limits  MAGNESIUM - Abnormal; Notable for the following components:   Magnesium 1.5 (*)    All other components within normal limits  RESP PANEL  BY RT-PCR (FLU A&B, COVID) ARPGX2  PROTIME-INR  TYPE AND SCREEN   ____________________________________________  EKG  Sinus bradycardia with ventricular rate of 57 and T wave inversions in the inferior lateral leads as well as nonspecific change in  V3.  These changes all appear very similar to ECG obtained on 10/31/2021. ____________________________________________  RADIOLOGY  ED MD interpretation:   Official radiology report(s): No results found.  ____________________________________________   PROCEDURES  Procedure(s) performed (including Critical Care):  .1-3 Lead EKG Interpretation Performed by: Lucrezia Starch, MD Authorized by: Lucrezia Starch, MD     Interpretation: normal     ECG rate assessment: bradycardic     Rhythm: sinus bradycardia     Ectopy: none     Conduction: normal       ____________________________________________   INITIAL IMPRESSION / ASSESSMENT AND PLAN / ED COURSE     Patient presents with [place] to history exam after she had some bleeding from left upper extremity fistula accessed earlier today during her dialysis session.  On arrival she is afebrile and hemodynamically stable.  Tourniquet immediate taken down on arrival and there is no subsequent bleeding.  There is a palpable thrill.  She is at her neurological baseline and denies any acute associated pain or other complaints.  She is not anticoagulated.  Her CBC shows anemia with a hemoglobin of 8.6 down from 13.6 although she has range between 9.8 and 13.6 over the last couple years.  Her platelets are 132.  INR is unremarkable.  BMP shows a K of 2.5 which was gently repleted but otherwise no significant unexpected derangements.  Mg 1.5. Also gently repleted. Type and screen sent.  Discussed with on-call vascular surgeon Dr. Delana Meyer who did come to the emergency room to evaluate patient.  He recommends discharge with plan for outpatient fistulogram and future dialysis access to outpatient tunnel cath.  I think this is reasonable.  He will communicate this in writing to her dialysis providers.  Given no indication for transfusion and patient otherwise hemodynamically stable without evidence of ongoing bleeding I think she is safe for discharge with  outpatient follow-up.  Discharged stable condition.  Strict return precautions provided in writing to nursing facility.    ____________________________________________   FINAL CLINICAL IMPRESSION(S) / ED DIAGNOSES  Final diagnoses:  A-V fistula (HCC)  Low hemoglobin  Dementia without behavioral disturbance, unspecified dementia type ([place])  Bleeding  Hemorrhage from dialysis catheter (HCC)  Hypokalemia  Hypomagnesemia    Medications  potassium chloride SA (KLOR-CON) CR tablet 40 mEq (has no administration in time range)  magnesium sulfate IVPB 2 g 50 mL (has no administration in time range)     ED Discharge Orders    None       Note:  This document was prepared using Dragon voice recognition software and may include unintentional dictation errors.   Lucrezia Starch, MD 11/22/20 1556

## 2020-11-22 NOTE — ED Notes (Signed)
[name] notified pt will be returning to facility

## 2020-11-27 ENCOUNTER — Telehealth (INDEPENDENT_AMBULATORY_CARE_PROVIDER_SITE_OTHER): Payer: Self-pay | Admitting: Vascular Surgery

## 2020-11-27 ENCOUNTER — Telehealth (INDEPENDENT_AMBULATORY_CARE_PROVIDER_SITE_OTHER): Payer: Self-pay

## 2020-11-27 NOTE — Telephone Encounter (Signed)
Documentation only.

## 2020-11-27 NOTE — Telephone Encounter (Signed)
A fax was received from [place] at [facility] wanting the patient to have a permcath removal. Patient is scheduled on 12/07/20 with a 11:00 am arrival to the [facility]. Covid testing on 12/05/20 between 8-2 pm at the [facility]. Pre-procedure instructions will be faxed to attention Celeste at [facility].

## 2020-11-28 NOTE — Telephone Encounter (Signed)
Celeste from [place] called to reschedule the patient from 12/07/20 with Dr. Lucky Cowboy to 12/12/20 with Dr. Delana Meyer with a 2:00 pm arrival time to the [facility]. Covid has been rescheduled to 12/08/20 between 8-2 pm. Pre-procedure instructions will be faxed to [facility] at [facility].

## 2020-12-05 ENCOUNTER — Other Ambulatory Visit (INDEPENDENT_AMBULATORY_CARE_PROVIDER_SITE_OTHER): Payer: Self-pay | Admitting: Vascular Surgery

## 2020-12-05 DIAGNOSIS — N186 End stage renal disease: Secondary | ICD-10-CM

## 2020-12-07 ENCOUNTER — Other Ambulatory Visit: Payer: Self-pay

## 2020-12-07 ENCOUNTER — Ambulatory Visit (INDEPENDENT_AMBULATORY_CARE_PROVIDER_SITE_OTHER): Payer: Medicare Other | Admitting: Vascular Surgery

## 2020-12-07 ENCOUNTER — Ambulatory Visit (INDEPENDENT_AMBULATORY_CARE_PROVIDER_SITE_OTHER): Payer: Medicare Other

## 2020-12-07 ENCOUNTER — Encounter (INDEPENDENT_AMBULATORY_CARE_PROVIDER_SITE_OTHER): Payer: Self-pay | Admitting: Vascular Surgery

## 2020-12-07 VITALS — BP 192/64 | HR 59 | Resp 16 | Wt 113.4 lb

## 2020-12-07 DIAGNOSIS — I2510 Atherosclerotic heart disease of native coronary artery without angina pectoris: Secondary | ICD-10-CM | POA: Diagnosis not present

## 2020-12-07 DIAGNOSIS — I10 Essential (primary) hypertension: Secondary | ICD-10-CM | POA: Diagnosis not present

## 2020-12-07 DIAGNOSIS — N186 End stage renal disease: Secondary | ICD-10-CM

## 2020-12-07 DIAGNOSIS — Z992 Dependence on renal dialysis: Secondary | ICD-10-CM

## 2020-12-07 DIAGNOSIS — Z794 Long term (current) use of insulin: Secondary | ICD-10-CM

## 2020-12-07 DIAGNOSIS — T829XXS Unspecified complication of cardiac and vascular prosthetic device, implant and graft, sequela: Secondary | ICD-10-CM

## 2020-12-07 DIAGNOSIS — E1122 Type 2 diabetes mellitus with diabetic chronic kidney disease: Secondary | ICD-10-CM

## 2020-12-07 NOTE — Progress Notes (Signed)
MRN : [id number]  Alyssa [name] is a 80 y.o. (February 12, 1941) female who presents with chief complaint of No chief complaint on file. Marland Kitchen  History of Present Illness:    The patient is seen for evaluation of dialysis access.  The patient is s/p left brachial cephalic fistula on [id number].    Current access is via a right IJ catheter which is functioning well.  The catheter was exchanged on 10/13/2020.  The patient denies amaurosis fugax or recent TIA symptoms. There are no recent neurological changes noted. The patient denies claudication symptoms or rest pain symptoms. The patient denies history of DVT, PE or superficial thrombophlebitis. The patient denies recent episodes of angina or shortness of breath.    No outpatient medications have been marked as taking for the 12/07/20 encounter (Appointment) with Delana Meyer, Dolores Lory, MD.    Past Medical History:  Diagnosis Date  . Chronic systolic CHF (congestive heart failure) ([place])   . CKD (chronic kidney disease), stage III ([place])   . Dementia ([place])   . Essential hypertension   . Hyperlipidemia   . Hypertensive Non-ischemic Cardiomyopathy    a. 2010 Echo: EF 55%;  b. 2010 borderline MV w/ apical thinning; c. 2010 Cath: LCX 50p->Med Rx;  d. 09/2014 Echo: EF 35-40% w/ Sev LVH and glob HK;  e. 09/2014 MV: EF 30-44%, small fixed apical defect (breast attenuation), no ischemia.  . Hypokalemia   . Type II diabetes mellitus ([place])     Past Surgical History:  Procedure Laterality Date  . ABDOMINAL HYSTERECTOMY    . AV FISTULA PLACEMENT Left 08/04/2020   Procedure: INSERTION OF ARTERIOVENOUS (AV) GORE-TEX GRAFT ARM   ( BRACHIAL AXILLARY);  Surgeon: Katha Cabal, MD;  Location: [facility] ORS;  Service: Vascular;  Laterality: Left;  . CHOLECYSTECTOMY    . DIALYSIS/PERMA CATHETER INSERTION N/A 03/28/2020   Procedure: DIALYSIS/PERMA CATHETER INSERTION;  Surgeon: Katha Cabal, MD;  Location[facility];  Service: Cardiovascular;   Laterality: N/A;  . DIALYSIS/PERMA CATHETER INSERTION N/A 10/13/2020   Procedure: DIALYSIS/PERMA CATHETER INSERTION;  Surgeon: Katha Cabal, MD;  Location: [place] CV LAB;  Service: Cardiovascular;  Laterality: N/A;  . TONSILLECTOMY      Social History Social History   Tobacco Use  . Smoking status: Former Smoker    Packs/day: 0.25    Years: 10.00    Pack years: 2.50    Types: Cigarettes  . Smokeless tobacco: Former Systems developer    Types: Secondary school teacher  . Vaping Use: Never used  Substance Use Topics  . Alcohol use: Not Currently    Alcohol/week: 1.0 standard drink    Types: 1 Cans of beer per week  . Drug use: No    Family History Family History  Problem Relation Age of Onset  . Diabetes Mother   . Hypertension Mother   . Diabetes Father   . Hypertension Father   . Breast cancer Neg Hx     No Known Allergies   REVIEW OF SYSTEMS (Negative unless checked)  Constitutional: '[]'$ Weight loss  '[]'$ Fever  '[]'$ Chills Cardiac: '[]'$ Chest pain   '[]'$ Chest pressure   '[]'$ Palpitations   '[]'$ Shortness of breath when laying flat   '[]'$ Shortness of breath with exertion. Vascular:  '[]'$ Pain in legs with walking   '[]'$ Pain in legs at rest  '[]'$ History of DVT   '[]'$ Phlebitis   '[]'$ Swelling in legs   '[]'$ Varicose veins   '[]'$ Non-healing ulcers Pulmonary:   '[]'$ Uses home oxygen   '[]'$ Productive  cough   '[]'$ Hemoptysis   '[]'$ Wheeze  '[]'$ COPD   '[]'$ Asthma Neurologic:  '[]'$ Dizziness   '[]'$ Seizures   '[]'$ History of stroke   '[]'$ History of TIA  '[]'$ Aphasia   '[]'$ Vissual changes   '[]'$ Weakness or numbness in arm   '[]'$ Weakness or numbness in leg Musculoskeletal:   '[]'$ Joint swelling   '[]'$ Joint pain   '[]'$ Low back pain Hematologic:  '[]'$ Easy bruising  '[]'$ Easy bleeding   '[]'$ Hypercoagulable state   '[]'$ Anemic Gastrointestinal:  '[]'$ Diarrhea   '[]'$ Vomiting  '[]'$ Gastroesophageal reflux/heartburn   '[]'$ Difficulty swallowing. Genitourinary:  '[x]'$ Chronic kidney disease   '[]'$ Difficult urination  '[]'$ Frequent urination   '[]'$ Blood in urine Skin:  '[]'$ Rashes   '[]'$ Ulcers   Psychological:  '[]'$ History of anxiety   '[]'$  History of major depression.  Physical Examination  There were no vitals filed for this visit. There is no height or weight on file to calculate BMI. Gen: WD/WN, NAD Head: [place]/AT, No temporalis wasting.  Ear/Nose/Throat: Hearing grossly intact, nares w/o erythema or drainage Eyes: PER, EOMI, sclera nonicteric.  Neck: Supple, no large masses.   Pulmonary:  Good air movement, no audible wheezing bilaterally, no use of accessory muscles.  Cardiac: RRR, no JVD Vascular: left AV fistula good thrill good bruit; right IJ catheter CD&I Vessel Right Left  Radial Palpable Palpable  Brachial Palpable Palpable  Gastrointestinal: Non-distended. No guarding/no peritoneal signs.  Musculoskeletal: M/S 5/5 throughout.  No deformity or atrophy.  Neurologic: CN 2-12 intact. Symmetrical.  Speech is fluent. Motor exam as listed above. Psychiatric: Judgment intact, Mood & affect appropriate for pt's clinical situation. Dermatologic: No rashes or ulcers noted.  No changes consistent with cellulitis. Lymph : No lichenification or skin changes of chronic lymphedema.  CBC Lab Results  Component Value Date   WBC 5.2 11/22/2020   HGB 8.6 (L) 11/22/2020   HCT 26.6 (L) 11/22/2020   MCV 94.3 11/22/2020   PLT 132 (L) 11/22/2020    BMET    Component Value Date/Time   NA 138 11/22/2020 1456   NA 139 05/25/2018 1007   NA 138 10/28/2014 0105   K 2.5 (LL) 11/22/2020 1456   K 3.7 10/28/2014 0105   CL 100 11/22/2020 1456   CL 106 10/28/2014 0105   CO2 30 11/22/2020 1456   CO2 26 10/28/2014 0105   GLUCOSE 137 (H) 11/22/2020 1456   GLUCOSE 212 (H) 10/28/2014 0105   BUN 8 11/22/2020 1456   BUN 17 05/25/2018 1007   BUN 15 10/28/2014 0105   CREATININE 2.09 (H) 11/22/2020 1456   CREATININE 1.12 (H) 10/28/2014 0105   CALCIUM 7.8 (L) 11/22/2020 1456   CALCIUM 8.3 (L) 10/28/2014 0105   GFRNONAA 24 (L) 11/22/2020 1456   GFRNONAA 49 (L) 10/28/2014 0105   GFRAA 31 (L)  05/25/2018 1007   GFRAA 57 (L) 10/28/2014 0105   CrCl cannot be calculated (Unknown ideal weight.).  COAG Lab Results  Component Value Date   INR 1.1 11/22/2020   INR 1.0 08/02/2020   INR 0.95 03/05/2016    Radiology No results found.   Assessment/Plan 1. End stage renal disease ([place]) Recommend:  The patient is doing well and currently has adequate dialysis access. The patient's dialysis center is not reporting any access issues. Flow pattern is stable when compared to the prior ultrasound.  The patient should have a duplex ultrasound of the dialysis access in 6 months.  The patient will follow-up with me in the office after each ultrasound.  Tunneled catheter is to be removed this coming Tuesday     -  VAS US DUPLEX DIALYSIS ACCESS (AVF, AVG); Future  2. Complication of vascular access for dialysis, sequela Recommend:  The patient is doing well and currently has adequate dialysis access. The patient's dialysis center is not reporting any access issues. Flow pattern is stable when compared to the prior ultrasound.  The patient should have a duplex ultrasound of the dialysis access in 6 months.  The patient will follow-up with me in the office after each ultrasound.  Tunneled catheter is to be removed this coming Tuesday     3. Essential hypertension Continue antihypertensive medications as already ordered, these medications have been reviewed and there are no changes at this time.   4. Coronary artery disease involving native coronary artery of native heart without angina pectoris Continue cardiac and antihypertensive medications as already ordered and reviewed, no changes at this time.  Continue statin as ordered and reviewed, no changes at this time  Nitrates PRN for chest pain   5. Type 2 diabetes mellitus with chronic kidney disease on chronic dialysis, with long-term current use of insulin (HCC) Continue hypoglycemic medications as already ordered, these  medications have been reviewed and there are no changes at this time.  Hgb A1C to be monitored as already arranged by primary service    [name], MD  12/07/2020 11:18 AM

## 2020-12-07 NOTE — H&P (View-Only) (Signed)
MRN : [id number]  Alyssa [name] is a 80 y.o. (April 24, 1941) female who presents with chief complaint of No chief complaint on file. Marland Kitchen  History of Present Illness:    The patient is seen for evaluation of dialysis access.  The patient is s/p left brachial cephalic fistula on [id number].    Current access is via a right IJ catheter which is functioning well.  The catheter was exchanged on 10/13/2020.  The patient denies amaurosis fugax or recent TIA symptoms. There are no recent neurological changes noted. The patient denies claudication symptoms or rest pain symptoms. The patient denies history of DVT, PE or superficial thrombophlebitis. The patient denies recent episodes of angina or shortness of breath.    No outpatient medications have been marked as taking for the 12/07/20 encounter (Appointment) with Delana Meyer, Dolores Lory, MD.    Past Medical History:  Diagnosis Date  . Chronic systolic CHF (congestive heart failure) ([place])   . CKD (chronic kidney disease), stage III ([employer])   . Dementia ([place])   . Essential hypertension   . Hyperlipidemia   . Hypertensive Non-ischemic Cardiomyopathy    a. 2010 Echo: EF 55%;  b. 2010 borderline MV w/ apical thinning; c. 2010 Cath: LCX 50p->Med Rx;  d. 09/2014 Echo: EF 35-40% w/ Sev LVH and glob HK;  e. 09/2014 MV: EF 30-44%, small fixed apical defect (breast attenuation), no ischemia.  . Hypokalemia   . Type II diabetes mellitus ([place])     Past Surgical History:  Procedure Laterality Date  . ABDOMINAL HYSTERECTOMY    . AV FISTULA PLACEMENT Left 08/04/2020   Procedure: INSERTION OF ARTERIOVENOUS (AV) GORE-TEX GRAFT ARM   ( BRACHIAL AXILLARY);  Surgeon: Katha Cabal, MD;  Location: [facility];  Service: Vascular;  Laterality: Left;  . CHOLECYSTECTOMY    . DIALYSIS/PERMA CATHETER INSERTION N/A 03/28/2020   Procedure: DIALYSIS/PERMA CATHETER INSERTION;  Surgeon: Katha Cabal, MD;  Location: [facility];  Service: Cardiovascular;   Laterality: N/A;  . DIALYSIS/PERMA CATHETER INSERTION N/A 10/13/2020   Procedure: DIALYSIS/PERMA CATHETER INSERTION;  Surgeon: Katha Cabal, MD;  Location: [facility];  Service: Cardiovascular;  Laterality: N/A;  . TONSILLECTOMY      Social History Social History   Tobacco Use  . Smoking status: Former Smoker    Packs/day: 0.25    Years: 10.00    Pack years: 2.50    Types: Cigarettes  . Smokeless tobacco: Former Systems developer    Types: Secondary school teacher  . Vaping Use: Never used  Substance Use Topics  . Alcohol use: Not Currently    Alcohol/week: 1.0 standard drink    Types: 1 Cans of beer per week  . Drug use: No    Family History Family History  Problem Relation Age of Onset  . Diabetes Mother   . Hypertension Mother   . Diabetes Father   . Hypertension Father   . Breast cancer Neg Hx     No Known Allergies   REVIEW OF SYSTEMS (Negative unless checked)  Constitutional: '[]'$ Weight loss  '[]'$ Fever  '[]'$ Chills Cardiac: '[]'$ Chest pain   '[]'$ Chest pressure   '[]'$ Palpitations   '[]'$ Shortness of breath when laying flat   '[]'$ Shortness of breath with exertion. Vascular:  '[]'$ Pain in legs with walking   '[]'$ Pain in legs at rest  '[]'$ History of DVT   '[]'$ Phlebitis   '[]'$ Swelling in legs   '[]'$ Varicose veins   '[]'$ Non-healing ulcers Pulmonary:   '[]'$ Uses home oxygen   '[]'$ Productive  cough   '[]'$ Hemoptysis   '[]'$ Wheeze  '[]'$ COPD   '[]'$ Asthma Neurologic:  '[]'$ Dizziness   '[]'$ Seizures   '[]'$ History of stroke   '[]'$ History of TIA  '[]'$ Aphasia   '[]'$ Vissual changes   '[]'$ Weakness or numbness in arm   '[]'$ Weakness or numbness in leg Musculoskeletal:   '[]'$ Joint swelling   '[]'$ Joint pain   '[]'$ Low back pain Hematologic:  '[]'$ Easy bruising  '[]'$ Easy bleeding   '[]'$ Hypercoagulable state   '[]'$ Anemic Gastrointestinal:  '[]'$ Diarrhea   '[]'$ Vomiting  '[]'$ Gastroesophageal reflux/heartburn   '[]'$ Difficulty swallowing. Genitourinary:  '[x]'$ Chronic kidney disease   '[]'$ Difficult urination  '[]'$ Frequent urination   '[]'$ Blood in urine Skin:  '[]'$ Rashes   '[]'$ Ulcers   Psychological:  '[]'$ History of anxiety   '[]'$  History of major depression.  Physical Examination  There were no vitals filed for this visit. There is no height or weight on file to calculate BMI. Gen: WD/WN, NAD Head: [place]/AT, No temporalis wasting.  Ear/Nose/Throat: Hearing grossly intact, nares w/o erythema or drainage Eyes: PER, EOMI, sclera nonicteric.  Neck: Supple, no large masses.   Pulmonary:  Good air movement, no audible wheezing bilaterally, no use of accessory muscles.  Cardiac: RRR, no JVD Vascular: left AV fistula good thrill good bruit; right IJ catheter CD&I Vessel Right Left  Radial Palpable Palpable  Brachial Palpable Palpable  Gastrointestinal: Non-distended. No guarding/no peritoneal signs.  Musculoskeletal: M/S 5/5 throughout.  No deformity or atrophy.  Neurologic: CN 2-12 intact. Symmetrical.  Speech is fluent. Motor exam as listed above. Psychiatric: Judgment intact, Mood & affect appropriate for pt's clinical situation. Dermatologic: No rashes or ulcers noted.  No changes consistent with cellulitis. Lymph : No lichenification or skin changes of chronic lymphedema.  CBC Lab Results  Component Value Date   WBC 5.2 11/22/2020   HGB 8.6 (L) 11/22/2020   HCT 26.6 (L) 11/22/2020   MCV 94.3 11/22/2020   PLT 132 (L) 11/22/2020    BMET    Component Value Date/Time   NA 138 11/22/2020 1456   NA 139 05/25/2018 1007   NA 138 10/28/2014 0105   K 2.5 (LL) 11/22/2020 1456   K 3.7 10/28/2014 0105   CL 100 11/22/2020 1456   CL 106 10/28/2014 0105   CO2 30 11/22/2020 1456   CO2 26 10/28/2014 0105   GLUCOSE 137 (H) 11/22/2020 1456   GLUCOSE 212 (H) 10/28/2014 0105   BUN 8 11/22/2020 1456   BUN 17 05/25/2018 1007   BUN 15 10/28/2014 0105   CREATININE 2.09 (H) 11/22/2020 1456   CREATININE 1.12 (H) 10/28/2014 0105   CALCIUM 7.8 (L) 11/22/2020 1456   CALCIUM 8.3 (L) 10/28/2014 0105   GFRNONAA 24 (L) 11/22/2020 1456   GFRNONAA 49 (L) 10/28/2014 0105   GFRAA 31 (L)  05/25/2018 1007   GFRAA 57 (L) 10/28/2014 0105   CrCl cannot be calculated (Unknown ideal weight.).  COAG Lab Results  Component Value Date   INR 1.1 11/22/2020   INR 1.0 08/02/2020   INR 0.95 03/05/2016    Radiology No results found.   Assessment/Plan 1. End stage renal disease ([place]) Recommend:  The patient is doing well and currently has adequate dialysis access. The patient's dialysis center is not reporting any access issues. Flow pattern is stable when compared to the prior ultrasound.  The patient should have a duplex ultrasound of the dialysis access in 6 months.  The patient will follow-up with me in the office after each ultrasound.  Tunneled catheter is to be removed this coming Tuesday     -  VAS US DUPLEX DIALYSIS ACCESS (AVF, AVG); Future  2. Complication of vascular access for dialysis, sequela Recommend:  The patient is doing well and currently has adequate dialysis access. The patient's dialysis center is not reporting any access issues. Flow pattern is stable when compared to the prior ultrasound.  The patient should have a duplex ultrasound of the dialysis access in 6 months.  The patient will follow-up with me in the office after each ultrasound.  Tunneled catheter is to be removed this coming Tuesday     3. Essential hypertension Continue antihypertensive medications as already ordered, these medications have been reviewed and there are no changes at this time.   4. Coronary artery disease involving native coronary artery of native heart without angina pectoris Continue cardiac and antihypertensive medications as already ordered and reviewed, no changes at this time.  Continue statin as ordered and reviewed, no changes at this time  Nitrates PRN for chest pain   5. Type 2 diabetes mellitus with chronic kidney disease on chronic dialysis, with long-term current use of insulin (HCC) Continue hypoglycemic medications as already ordered, these  medications have been reviewed and there are no changes at this time.  Hgb A1C to be monitored as already arranged by primary service    [name], MD  12/07/2020 11:18 AM

## 2020-12-08 ENCOUNTER — Other Ambulatory Visit: Payer: Medicare Other

## 2020-12-12 ENCOUNTER — Ambulatory Visit
Admission: RE | Admit: 2020-12-12 | Discharge: 2020-12-12 | Disposition: A | Discharge status: Home / Self Care | Payer: Medicare Other | Attending: Vascular Surgery | Admitting: Vascular Surgery

## 2020-12-12 ENCOUNTER — Encounter
Admission: RE | Disposition: A | Discharge status: Home / Self Care | Payer: Self-pay | Source: Home / Self Care | Attending: Vascular Surgery

## 2020-12-12 ENCOUNTER — Encounter: Payer: Self-pay | Admitting: Vascular Surgery

## 2020-12-12 ENCOUNTER — Other Ambulatory Visit (INDEPENDENT_AMBULATORY_CARE_PROVIDER_SITE_OTHER): Payer: Self-pay | Admitting: Nurse Practitioner

## 2020-12-12 DIAGNOSIS — I132 Hypertensive heart and chronic kidney disease with heart failure and with stage 5 chronic kidney disease, or end stage renal disease: Secondary | ICD-10-CM | POA: Diagnosis not present

## 2020-12-12 DIAGNOSIS — Z4901 Encounter for fitting and adjustment of extracorporeal dialysis catheter: Secondary | ICD-10-CM | POA: Insufficient documentation

## 2020-12-12 DIAGNOSIS — E1122 Type 2 diabetes mellitus with diabetic chronic kidney disease: Secondary | ICD-10-CM | POA: Insufficient documentation

## 2020-12-12 DIAGNOSIS — Z87891 Personal history of nicotine dependence: Secondary | ICD-10-CM | POA: Diagnosis not present

## 2020-12-12 DIAGNOSIS — N186 End stage renal disease: Secondary | ICD-10-CM | POA: Insufficient documentation

## 2020-12-12 DIAGNOSIS — I5022 Chronic systolic (congestive) heart failure: Secondary | ICD-10-CM | POA: Diagnosis not present

## 2020-12-12 DIAGNOSIS — T82898A Other specified complication of vascular prosthetic devices, implants and grafts, initial encounter: Secondary | ICD-10-CM | POA: Diagnosis not present

## 2020-12-12 DIAGNOSIS — I2510 Atherosclerotic heart disease of native coronary artery without angina pectoris: Secondary | ICD-10-CM | POA: Diagnosis not present

## 2020-12-12 DIAGNOSIS — Z992 Dependence on renal dialysis: Secondary | ICD-10-CM | POA: Diagnosis not present

## 2020-12-12 HISTORY — PX: DIALYSIS/PERMA CATHETER REMOVAL: CATH118289

## 2020-12-12 SURGERY — DIALYSIS/PERMA CATHETER REMOVAL
Anesthesia: LOCAL

## 2020-12-12 MED ORDER — MIDAZOLAM HCL 2 MG/ML PO SYRP
4.0000 mg | ORAL_SOLUTION | Freq: Once | ORAL | Status: AC
  Administered 2020-12-12: 4 mg via ORAL
  Filled 2020-12-12: qty 4
  Filled 2020-12-12: qty 2

## 2020-12-12 MED ORDER — LIDOCAINE-EPINEPHRINE (PF) 1 %-1:200000 IJ SOLN
INTRAMUSCULAR | Status: DC | PRN
  Administered 2020-12-12: 20 mL via INTRADERMAL

## 2020-12-12 SURGICAL SUPPLY — 6 items
CHLORAPREP W/TINT 26 (MISCELLANEOUS) ×4 IMPLANT
DERMABOND ADVANCED (GAUZE/BANDAGES/DRESSINGS) ×1
DERMABOND ADVANCED .7 DNX12 (GAUZE/BANDAGES/DRESSINGS) ×1 IMPLANT
FORCEPS HALSTEAD CVD 5IN STRL (INSTRUMENTS) ×2 IMPLANT
SUT MNCRL AB 4-0 PS2 18 (SUTURE) ×2 IMPLANT
TRAY LACERAT/PLASTIC (MISCELLANEOUS) ×2 IMPLANT

## 2020-12-12 NOTE — Interval H&P Note (Signed)
History and Physical Interval Note:  12/12/2020 5:48 PM  Alyssa [name]  has presented today for surgery, with the diagnosis of Perma Cath Removal   End Stage RenalCovid  May 10.  The various methods of treatment have been discussed with the patient and family. After consideration of risks, benefits and other options for treatment, the patient has consented to  Procedure(s): DIALYSIS/PERMA CATHETER REMOVAL (N/A) as a surgical intervention.  The patient's history has been reviewed, patient examined, no change in status, stable for surgery.  I have reviewed the patient's chart and labs.  Questions were answered to the patient's satisfaction.     [name]

## 2020-12-12 NOTE — Interval H&P Note (Signed)
History and Physical Interval Note:  12/12/2020 4:03 PM  Alyssa [name]  has presented today for surgery, with the diagnosis of Perma Cath Removal   End Stage Renal Covid  May 10.  The various methods of treatment have been discussed with the patient and family. After consideration of risks, benefits and other options for treatment, the patient has consented to  Procedure(s): DIALYSIS/PERMA CATHETER REMOVAL (N/A) as a surgical intervention.  The patient's history has been reviewed, patient examined, no change in status, stable for surgery.  I have reviewed the patient's chart and labs.  Questions were answered to the patient's satisfaction.    [place]

## 2020-12-12 NOTE — Discharge Instructions (Signed)
Tunneled Catheter Removal, Care After Refer to this sheet in the next few weeks. These instructions provide you with information about caring for yourself after your procedure. Your health care provider may also give you more specific instructions. Your treatment has been planned according to current medical practices, but problems sometimes occur. Call your health care provider if you have any problems or questions after your procedure. What can I expect after the procedure? After the procedure, it is common to have: Some mild redness, swelling, and pain around your catheter site.   Follow these instructions at home: Incision care  Check your removal site  every day for signs of infection. Check for: More redness, swelling, or pain. More fluid or blood. Warmth. Pus or a bad smell. Remove your dressing in 48hrs leave open to air  Activity  Return to your normal activities as told by your health care provider. Ask your health care provider what activities are safe for you. Do not lift anything that is heavier than 10 lb (4.5 kg) for 3 days  You may shower tomorrow  Contact a health care provider if: You have more fluid or blood coming from your removal site You have more redness, swelling, or pain at your incisions or around the area where your catheter was removed Your removal site feel warm to the touch. You feel unusually weak. You feel nauseous.. Get help right away if You have swelling in your arm, shoulder, neck, or face. You develop chest pain. You have difficulty breathing. You feel dizzy or light-headed. You have pus or a bad smell coming from your removal site You have a fever. You develop bleeding from your removal site, and your bleeding does not stop. This information is not intended to replace advice given to you by your health care provider. Make sure you discuss any questions you have with your health care provider. Document Released: 07/01/2012 Document Revised:  03/17/2016 Document Reviewed: 04/10/2015 Elsevier Interactive Patient Education  2017 Elsevier Inc[employer] 

## 2020-12-12 NOTE — Op Note (Signed)
  OPERATIVE NOTE   PROCEDURE: 1. Removal of a right IJ tunneled dialysis catheter  PRE-OPERATIVE DIAGNOSIS: Complication of dialysis catheter  POST-OPERATIVE DIAGNOSIS: Same  SURGEON: [name]  ANESTHESIA: Local anesthetic with 1% lidocaine with epinephrine   ESTIMATED BLOOD LOSS: Minimal   FINDING(S): 1. Catheter intact   SPECIMEN(S):  Catheter  INDICATIONS:   Alyssa [name] is a 80 y.o. female who presents with right IJ catheter but with a functioning left arm access she therefore is undergoing removal of her catheter.  DESCRIPTION: After obtaining full informed written consent, the patient was positioned supine. The right IJ catheter and surrounding area is prepped and draped in a sterile fashion. The cuff was localized by palpation and noted to be greater than 3 cm from the exit site. After appropriate timeout is called, 1% lidocaine with epinephrine is infiltrated into the surrounding tissues around the cuff. Small transverse incision is created with an 11 blade scalpel and the dissection was carried down to expose the cuff of the tunneled catheter.  The catheter is then freed from the surrounding attachments and adhesions. Once the catheter has been freed circumferentially it is transected just distal to the cuff and subsequently removed in 2 pieces. Light pressure was held at the base of the neck. A 4-0 Monocryl was used close the tunnel in the subcutaneous space. The 4-0 Monocryl Monocryl was then used to close the skin in a subcuticular stitch. Dermabond is applied.  Antibiotic ointment and a sterile dressing is applied to the exit site. Patient tolerated procedure well and there were no complications.  COMPLICATIONS: None  CONDITION: Unchanged  [name]. [facility] Office: [phone]  12/12/2020,5:20 PM

## 2020-12-13 ENCOUNTER — Encounter: Payer: Self-pay | Admitting: Vascular Surgery

## 2020-12-18 ENCOUNTER — Inpatient Hospital Stay (HOSPITAL_COMMUNITY)
Admission: EM | Admit: 2020-12-18 | Discharge: 2020-12-27 | DRG: 082 | Disposition: E | Payer: Medicare Other | Source: Other Acute Inpatient Hospital | Attending: Surgery | Admitting: Surgery

## 2020-12-18 ENCOUNTER — Other Ambulatory Visit: Payer: Self-pay

## 2020-12-18 ENCOUNTER — Emergency Department
Admission: EM | Admit: 2020-12-18 | Discharge: 2020-12-18 | Disposition: A | Discharge status: Other Acute Inpatient Hospital | Payer: Medicare Other | Attending: Emergency Medicine | Admitting: Emergency Medicine

## 2020-12-18 ENCOUNTER — Emergency Department: Payer: Medicare Other

## 2020-12-18 DIAGNOSIS — S065XAA Traumatic subdural hemorrhage with loss of consciousness status unknown, initial encounter: Secondary | ICD-10-CM

## 2020-12-18 DIAGNOSIS — Z87891 Personal history of nicotine dependence: Secondary | ICD-10-CM

## 2020-12-18 DIAGNOSIS — S06369A Traumatic hemorrhage of cerebrum, unspecified, with loss of consciousness of unspecified duration, initial encounter: Secondary | ICD-10-CM | POA: Insufficient documentation

## 2020-12-18 DIAGNOSIS — S065X9A Traumatic subdural hemorrhage with loss of consciousness of unspecified duration, initial encounter: Secondary | ICD-10-CM

## 2020-12-18 DIAGNOSIS — S0511XA Contusion of eyeball and orbital tissues, right eye, initial encounter: Secondary | ICD-10-CM | POA: Diagnosis present

## 2020-12-18 DIAGNOSIS — S0531XA Ocular laceration without prolapse or loss of intraocular tissue, right eye, initial encounter: Secondary | ICD-10-CM | POA: Diagnosis present

## 2020-12-18 DIAGNOSIS — Z7984 Long term (current) use of oral hypoglycemic drugs: Secondary | ICD-10-CM | POA: Insufficient documentation

## 2020-12-18 DIAGNOSIS — Z833 Family history of diabetes mellitus: Secondary | ICD-10-CM | POA: Diagnosis not present

## 2020-12-18 DIAGNOSIS — N186 End stage renal disease: Secondary | ICD-10-CM | POA: Diagnosis present

## 2020-12-18 DIAGNOSIS — S0633AA Contusion and laceration of cerebrum, unspecified, with loss of consciousness status unknown, initial encounter: Secondary | ICD-10-CM

## 2020-12-18 DIAGNOSIS — E119 Type 2 diabetes mellitus without complications: Secondary | ICD-10-CM | POA: Insufficient documentation

## 2020-12-18 DIAGNOSIS — I132 Hypertensive heart and chronic kidney disease with heart failure and with stage 5 chronic kidney disease, or end stage renal disease: Secondary | ICD-10-CM | POA: Insufficient documentation

## 2020-12-18 DIAGNOSIS — S065X0A Traumatic subdural hemorrhage without loss of consciousness, initial encounter: Secondary | ICD-10-CM | POA: Diagnosis not present

## 2020-12-18 DIAGNOSIS — Z7982 Long term (current) use of aspirin: Secondary | ICD-10-CM | POA: Diagnosis not present

## 2020-12-18 DIAGNOSIS — S0990XA Unspecified injury of head, initial encounter: Secondary | ICD-10-CM | POA: Diagnosis present

## 2020-12-18 DIAGNOSIS — E876 Hypokalemia: Secondary | ICD-10-CM | POA: Diagnosis present

## 2020-12-18 DIAGNOSIS — Z794 Long term (current) use of insulin: Secondary | ICD-10-CM

## 2020-12-18 DIAGNOSIS — G309 Alzheimer's disease, unspecified: Secondary | ICD-10-CM | POA: Diagnosis present

## 2020-12-18 DIAGNOSIS — Z8249 Family history of ischemic heart disease and other diseases of the circulatory system: Secondary | ICD-10-CM | POA: Diagnosis not present

## 2020-12-18 DIAGNOSIS — E785 Hyperlipidemia, unspecified: Secondary | ICD-10-CM | POA: Diagnosis present

## 2020-12-18 DIAGNOSIS — Z992 Dependence on renal dialysis: Secondary | ICD-10-CM | POA: Diagnosis not present

## 2020-12-18 DIAGNOSIS — Z66 Do not resuscitate: Secondary | ICD-10-CM | POA: Diagnosis not present

## 2020-12-18 DIAGNOSIS — I5022 Chronic systolic (congestive) heart failure: Secondary | ICD-10-CM | POA: Diagnosis present

## 2020-12-18 DIAGNOSIS — I25110 Atherosclerotic heart disease of native coronary artery with unstable angina pectoris: Secondary | ICD-10-CM | POA: Insufficient documentation

## 2020-12-18 DIAGNOSIS — S06349A Traumatic hemorrhage of right cerebrum with loss of consciousness of unspecified duration, initial encounter: Principal | ICD-10-CM | POA: Diagnosis present

## 2020-12-18 DIAGNOSIS — D631 Anemia in chronic kidney disease: Secondary | ICD-10-CM | POA: Insufficient documentation

## 2020-12-18 DIAGNOSIS — W01198A Fall on same level from slipping, tripping and stumbling with subsequent striking against other object, initial encounter: Secondary | ICD-10-CM | POA: Diagnosis not present

## 2020-12-18 DIAGNOSIS — I2510 Atherosclerotic heart disease of native coronary artery without angina pectoris: Secondary | ICD-10-CM | POA: Diagnosis present

## 2020-12-18 DIAGNOSIS — F0390 Unspecified dementia without behavioral disturbance: Secondary | ICD-10-CM | POA: Diagnosis not present

## 2020-12-18 DIAGNOSIS — Y92129 Unspecified place in nursing home as the place of occurrence of the external cause: Secondary | ICD-10-CM | POA: Insufficient documentation

## 2020-12-18 DIAGNOSIS — S06360A Traumatic hemorrhage of cerebrum, unspecified, without loss of consciousness, initial encounter: Secondary | ICD-10-CM | POA: Diagnosis present

## 2020-12-18 DIAGNOSIS — Z20822 Contact with and (suspected) exposure to covid-19: Secondary | ICD-10-CM | POA: Diagnosis present

## 2020-12-18 DIAGNOSIS — W1830XA Fall on same level, unspecified, initial encounter: Secondary | ICD-10-CM | POA: Diagnosis present

## 2020-12-18 DIAGNOSIS — Z9049 Acquired absence of other specified parts of digestive tract: Secondary | ICD-10-CM | POA: Diagnosis not present

## 2020-12-18 DIAGNOSIS — Z515 Encounter for palliative care: Secondary | ICD-10-CM

## 2020-12-18 DIAGNOSIS — I428 Other cardiomyopathies: Secondary | ICD-10-CM | POA: Diagnosis present

## 2020-12-18 DIAGNOSIS — Z978 Presence of other specified devices: Secondary | ICD-10-CM

## 2020-12-18 DIAGNOSIS — E1122 Type 2 diabetes mellitus with diabetic chronic kidney disease: Secondary | ICD-10-CM | POA: Diagnosis present

## 2020-12-18 DIAGNOSIS — S0231XA Fracture of orbital floor, right side, initial encounter for closed fracture: Secondary | ICD-10-CM | POA: Diagnosis present

## 2020-12-18 DIAGNOSIS — Z90710 Acquired absence of both cervix and uterus: Secondary | ICD-10-CM

## 2020-12-18 DIAGNOSIS — Z79899 Other long term (current) drug therapy: Secondary | ICD-10-CM | POA: Insufficient documentation

## 2020-12-18 DIAGNOSIS — S06319A Contusion and laceration of right cerebrum with loss of consciousness of unspecified duration, initial encounter: Secondary | ICD-10-CM

## 2020-12-18 DIAGNOSIS — S0512XA Contusion of eyeball and orbital tissues, left eye, initial encounter: Secondary | ICD-10-CM | POA: Insufficient documentation

## 2020-12-18 DIAGNOSIS — Z7189 Other specified counseling: Secondary | ICD-10-CM | POA: Diagnosis not present

## 2020-12-18 LAB — BASIC METABOLIC PANEL
Anion gap: 7 (ref 5–15)
BUN: 8 mg/dL (ref 8–23)
CO2: 31 mmol/L (ref 22–32)
Calcium: 8.3 mg/dL — ABNORMAL LOW (ref 8.9–10.3)
Chloride: 102 mmol/L (ref 98–111)
Creatinine, Ser: 2.53 mg/dL — ABNORMAL HIGH (ref 0.44–1.00)
GFR, Estimated: 19 mL/min — ABNORMAL LOW (ref >60)
Glucose, Bld: 77 mg/dL (ref 70–99)
Potassium: 3.5 mmol/L (ref 3.5–5.1)
Sodium: 140 mmol/L (ref 135–145)

## 2020-12-18 LAB — CBC WITH DIFFERENTIAL/PLATELET
Abs Immature Granulocytes: 0.02 10*3/uL (ref 0.00–0.07)
Basophils Absolute: 0 10*3/uL (ref 0.0–0.1)
Basophils Relative: 0 %
Eosinophils Absolute: 0.1 10*3/uL (ref 0.0–0.5)
Eosinophils Relative: 3 %
HCT: 25.5 % — ABNORMAL LOW (ref 36.0–46.0)
Hemoglobin: 8.4 g/dL — ABNORMAL LOW (ref 12.0–15.0)
Immature Granulocytes: 0 %
Lymphocytes Relative: 33 %
Lymphs Abs: 1.8 10*3/uL (ref 0.7–4.0)
MCH: 32.4 pg (ref 26.0–34.0)
MCHC: 32.9 g/dL (ref 30.0–36.0)
MCV: 98.5 fL (ref 80.0–100.0)
Monocytes Absolute: 0.7 10*3/uL (ref 0.1–1.0)
Monocytes Relative: 12 %
Neutro Abs: 2.8 10*3/uL (ref 1.7–7.7)
Neutrophils Relative %: 52 %
Platelets: 155 10*3/uL (ref 150–400)
RBC: 2.59 MIL/uL — ABNORMAL LOW (ref 3.87–5.11)
RDW: 15 % (ref 11.5–15.5)
WBC: 5.5 10*3/uL (ref 4.0–10.5)
nRBC: 0 % (ref 0.0–0.2)

## 2020-12-18 LAB — I-STAT ARTERIAL BLOOD GAS, ED
Acid-Base Excess: 5 mmol/L — ABNORMAL HIGH (ref 0.0–2.0)
Bicarbonate: 30 mmol/L — ABNORMAL HIGH (ref 20.0–28.0)
Calcium, Ion: 1.09 mmol/L — ABNORMAL LOW (ref 1.15–1.40)
HCT: 24 % — ABNORMAL LOW (ref 36.0–46.0)
Hemoglobin: 8.2 g/dL — ABNORMAL LOW (ref 12.0–15.0)
O2 Saturation: 100 %
Patient temperature: 97.4
Potassium: 3.1 mmol/L — ABNORMAL LOW (ref 3.5–5.1)
Sodium: 140 mmol/L (ref 135–145)
TCO2: 31 mmol/L (ref 22–32)
pCO2 arterial: 44.5 mmHg (ref 32.0–48.0)
pH, Arterial: 7.435 (ref 7.350–7.450)
pO2, Arterial: 186 mmHg — ABNORMAL HIGH (ref 83.0–108.0)

## 2020-12-18 LAB — BLOOD GAS, ARTERIAL
Acid-Base Excess: 7 mmol/L — ABNORMAL HIGH (ref 0.0–2.0)
Bicarbonate: 29.7 mmol/L — ABNORMAL HIGH (ref 20.0–28.0)
FIO2: 0.5
MECHVT: 400 mL
Mechanical Rate: 14
O2 Saturation: 99.9 %
PEEP: 5 cmH2O
Patient temperature: 37
pCO2 arterial: 34 mmHg (ref 32.0–48.0)
pH, Arterial: 7.55 — ABNORMAL HIGH (ref 7.350–7.450)
pO2, Arterial: 226 mmHg — ABNORMAL HIGH (ref 83.0–108.0)

## 2020-12-18 LAB — PROTIME-INR
INR: 1 (ref 0.8–1.2)
Prothrombin Time: 12.7 seconds (ref 11.4–15.2)

## 2020-12-18 LAB — RESP PANEL BY RT-PCR (FLU A&B, COVID) ARPGX2
Influenza A by PCR: NEGATIVE
Influenza B by PCR: NEGATIVE
SARS Coronavirus 2 by RT PCR: NEGATIVE

## 2020-12-18 LAB — CBG MONITORING, ED: Glucose-Capillary: 136 mg/dL — ABNORMAL HIGH (ref 70–99)

## 2020-12-18 MED ORDER — ROCURONIUM BROMIDE 50 MG/5ML IV SOLN
50.0000 mg | Freq: Once | INTRAVENOUS | Status: AC
  Administered 2020-12-18: 50 mg via INTRAVENOUS

## 2020-12-18 MED ORDER — FENTANYL 2500MCG IN NS 250ML (10MCG/ML) PREMIX INFUSION
25.0000 ug/h | INTRAVENOUS | Status: DC
  Administered 2020-12-18: 100 ug/h via INTRAVENOUS
  Administered 2020-12-20: 75 ug/h via INTRAVENOUS
  Filled 2020-12-18 (×2): qty 250

## 2020-12-18 MED ORDER — FENTANYL CITRATE (PF) 100 MCG/2ML IJ SOLN
25.0000 ug | Freq: Once | INTRAMUSCULAR | Status: AC
  Administered 2020-12-18: 25 ug via INTRAVENOUS

## 2020-12-18 MED ORDER — ACETAMINOPHEN 325 MG PO TABS: 650.0000 mg | ORAL_TABLET | ORAL | Status: DC | PRN

## 2020-12-18 MED ORDER — LEVETIRACETAM IN NACL 1500 MG/100ML IV SOLN
1500.0000 mg | Freq: Once | INTRAVENOUS | Status: DC
  Filled 2020-12-18: qty 100

## 2020-12-18 MED ORDER — PROPOFOL 1000 MG/100ML IV EMUL
INTRAVENOUS | Status: AC
  Administered 2020-12-18: 15 ug/kg/min
  Filled 2020-12-18: qty 100

## 2020-12-18 MED ORDER — LORAZEPAM 2 MG/ML IJ SOLN
1.0000 mg | Freq: Once | INTRAMUSCULAR | Status: DC
  Filled 2020-12-18: qty 1

## 2020-12-18 MED ORDER — SODIUM CHLORIDE 0.9 % IV SOLN: INTRAVENOUS | Status: DC

## 2020-12-18 MED ORDER — FENTANYL 2500MCG IN NS 250ML (10MCG/ML) PREMIX INFUSION
INTRAVENOUS | Status: AC
  Administered 2020-12-18: 50 ug/h
  Filled 2020-12-18: qty 250

## 2020-12-18 MED ORDER — MORPHINE SULFATE (PF) 2 MG/ML IV SOLN: 2.0000 mg | INTRAVENOUS | Status: DC | PRN

## 2020-12-18 MED ORDER — LORAZEPAM 2 MG/ML IJ SOLN
1.0000 mg | Freq: Once | INTRAMUSCULAR | Status: AC
  Administered 2020-12-18: 1 mg via INTRAVENOUS

## 2020-12-18 MED ORDER — NICARDIPINE HCL IN NACL 20-0.86 MG/200ML-% IV SOLN
3.0000 mg/h | INTRAVENOUS | Status: DC
  Administered 2020-12-18: 5 mg/h via INTRAVENOUS
  Filled 2020-12-18: qty 200

## 2020-12-18 MED ORDER — ONDANSETRON HCL 4 MG/2ML IJ SOLN
INTRAMUSCULAR | Status: AC
  Filled 2020-12-18: qty 2

## 2020-12-18 MED ORDER — FENTANYL BOLUS VIA INFUSION
25.0000 ug | INTRAVENOUS | Status: DC | PRN
  Administered 2020-12-19: 25 ug via INTRAVENOUS
  Filled 2020-12-18: qty 100

## 2020-12-18 MED ORDER — PROPOFOL 1000 MG/100ML IV EMUL
0.0000 ug/kg/min | INTRAVENOUS | Status: DC
  Administered 2020-12-18: 10 ug/kg/min via INTRAVENOUS

## 2020-12-18 MED ORDER — DOCUSATE SODIUM 100 MG PO CAPS: 100.0000 mg | ORAL_CAPSULE | Freq: Two times a day (BID) | ORAL | Status: DC

## 2020-12-18 MED ORDER — INSULIN ASPART 100 UNIT/ML IJ SOLN
0.0000 [IU] | INTRAMUSCULAR | Status: DC
Start: 2020-12-19 — End: 2020-12-20
  Administered 2020-12-18: 2 [IU] via SUBCUTANEOUS

## 2020-12-18 MED ORDER — DOCUSATE SODIUM 50 MG/5ML PO LIQD
100.0000 mg | Freq: Two times a day (BID) | ORAL | Status: DC
  Administered 2020-12-19: 100 mg
  Filled 2020-12-18: qty 10

## 2020-12-18 MED ORDER — POLYETHYLENE GLYCOL 3350 17 G PO PACK
17.0000 g | PACK | Freq: Every day | ORAL | Status: DC
  Administered 2020-12-19: 17 g
  Filled 2020-12-18: qty 1

## 2020-12-18 MED ORDER — MIDAZOLAM HCL 5 MG/5ML IJ SOLN
INTRAMUSCULAR | Status: AC
  Administered 2020-12-18: 20 mg
  Filled 2020-12-18: qty 5

## 2020-12-18 MED ORDER — HALOPERIDOL LACTATE 5 MG/ML IJ SOLN
2.0000 mg | Freq: Once | INTRAMUSCULAR | Status: AC
  Administered 2020-12-18: 2 mg via INTRAVENOUS
  Filled 2020-12-18: qty 1

## 2020-12-18 NOTE — ED Triage Notes (Signed)
Patient arrives from [facility] with [facility], per [facility], [facility]ene stopped en route due to hypotension.

## 2020-12-18 NOTE — ED Notes (Signed)
20 Versed in now

## 2020-12-18 NOTE — ED Notes (Signed)
This Rn called and updated [name]. This RN spoke to [place], med tech about pt situation and pt transfer.

## 2020-12-18 NOTE — ED Notes (Addendum)
This RN updated [name], [name] the med tech on pt condition. If any changes  contact number below.    [phone]

## 2020-12-18 NOTE — ED Provider Notes (Signed)
[facility] EMERGENCY DEPARTMENT Provider Note   CSN: [id number] Arrival date & time: 12/04/2020  2217     History Chief Complaint  Patient presents with  . Head Injury    Alyssa [name] is a 80 y.o. female.  HPI Level 26 caveat  79 year old female transported to [facility] for trauma surgery evaluation.  Review of note reveals that patient fell tonight.  She presented to [place] from her long-term care facility and has a history of dementia.  She struck the corner of a desk with her right eye.  CT her head showed a large intraparenchymal bleed and a ruptured right globe.  Patient was intubated at [facility] and had nicardipine drip started.  It was stopped and route here and blood pressure currently is systolic 94.  She is on a propofol and fentanyl drip for sedation  Past Medical History:  Diagnosis Date  . Chronic systolic CHF (congestive heart failure) ([place])   . CKD (chronic kidney disease), stage III ([place])   . Dementia ([place])   . Essential hypertension   . Hyperlipidemia   . Hypertensive Non-ischemic Cardiomyopathy    a. 2010 Echo: EF 55%;  b. 2010 borderline MV w/ apical thinning; c. 2010 Cath: LCX 50p->Med Rx;  d. 09/2014 Echo: EF 35-40% w/ Sev LVH and glob HK;  e. 09/2014 MV: EF 30-44%, small fixed apical defect (breast attenuation), no ischemia.  . Hypokalemia   . Type II diabetes mellitus [facility])     Patient Active Problem List   Diagnosis Date Noted  . End stage renal disease ([place]) 06/29/2020  . Complication of vascular access for dialysis 06/29/2020  . Secondary hyperparathyroidism of renal origin ([place]) 02/08/2020  . Anemia in chronic kidney disease 12/02/2019  . Benign hypertensive kidney disease with chronic kidney disease 12/02/2019  . Hypocalcemia 12/02/2019  . Proteinuria 12/02/2019  . Insomnia 06/29/2018  . Constipation 06/29/2018  . Tobacco use disorder 05/25/2018  . Closed fracture of right distal humerus 04/15/2017  . History of falling  02/11/2017  . Hip pain, left 01/31/2017  . Cardiomyopathy due to hypertension, without heart failure ([place]) 09/23/2016  . Cardiac murmur 06/03/2016  . Hyperlipidemia associated with type 2 diabetes mellitus ([place])   . Alzheimer's dementia without behavioral disturbance ([place]) 03/07/2015  . Coronary artery disease involving native coronary artery of native heart without angina pectoris 12/02/2014  . Allergic rhinitis 11/15/2014  . Essential hypertension 11/15/2014  . Arthritis, degenerative 11/15/2014  . Hypokalemia 11/15/2014  . H/O gastrointestinal hemorrhage 03/28/2011  . Diabetes ([place]) 03/28/2011    Past Surgical History:  Procedure Laterality Date  . ABDOMINAL HYSTERECTOMY    . AV FISTULA PLACEMENT Left 08/04/2020   Procedure: INSERTION OF ARTERIOVENOUS (AV) GORE-TEX GRAFT ARM   ( BRACHIAL AXILLARY);  Surgeon: Katha Cabal, MD;  Location: [facility] ORS;  Service: Vascular;  Laterality: Left;  . CHOLECYSTECTOMY    . DIALYSIS/PERMA CATHETER INSERTION N/A 03/28/2020   Procedure: DIALYSIS/PERMA CATHETER INSERTION;  Surgeon: Katha Cabal, MD;  Location: [place] CV LAB;  Service: Cardiovascular;  Laterality: N/A;  . DIALYSIS/PERMA CATHETER INSERTION N/A 10/13/2020   Procedure: DIALYSIS/PERMA CATHETER INSERTION;  Surgeon: Katha Cabal, MD;  Location: [place] CV LAB;  Service: Cardiovascular;  Laterality: N/A;  . DIALYSIS/PERMA CATHETER REMOVAL N/A 12/12/2020   Procedure: DIALYSIS/PERMA CATHETER REMOVAL;  Surgeon: Katha Cabal, MD;  Location: [place] CV LAB;  Service: Cardiovascular;  Laterality: N/A;  . TONSILLECTOMY       OB History  No obstetric history on file.     Family History  Problem Relation Age of Onset  . Diabetes Mother   . Hypertension Mother   . Diabetes Father   . Hypertension Father   . Breast cancer Neg Hx     Social History   Tobacco Use  . Smoking status: Former Smoker    Packs/day: 0.25    Years: 10.00    Pack years:  2.50    Types: Cigarettes  . Smokeless tobacco: Former Systems developer    Types: Secondary school teacher  . Vaping Use: Never used  Substance Use Topics  . Alcohol use: Not Currently    Alcohol/week: 1.0 standard drink    Types: 1 Cans of beer per week  . Drug use: No    Home Medications Prior to Admission medications   Medication Sig Start Date End Date Taking? Authorizing Provider  amLODipine (NORVASC) 10 MG tablet Take 1 tablet (10 mg total) by mouth daily. (0700) 08/29/20   Minna Merritts, MD  aspirin EC 81 MG tablet Take 81 mg by mouth daily. (0700)    [provider]  Blood Glucose Monitoring Suppl (TRUETRACK BLOOD GLUCOSE) w/Device KIT 1 each by Does not apply route daily. 12/06/15   Glean Hess, MD  Calcium Carb-Cholecalciferol (CALCIUM 600+D3) 600-800 MG-UNIT TABS Take 1 tablet by mouth daily. (0700)    [provider]  cloNIDine (CATAPRES) 0.1 MG tablet Take 0.1 mg by mouth daily as needed (SBP freater than or equal to 180).    [provider]  cloNIDine (CATAPRES) 0.2 MG tablet Take 1 tablet (0.2 mg total) by mouth 3 (three) times daily. 08/29/20   Minna Merritts, MD  donepezil (ARICEPT) 10 MG tablet Take 10 mg by mouth at bedtime. (2000)    [provider]  ferrous sulfate 325 (65 FE) MG tablet Take 325 mg by mouth daily with breakfast. (0700)    [provider]  glipiZIDE (GLUCOTROL) 5 MG tablet Take 5 mg by mouth daily. (0700)    [provider]  GLOBAL INSULIN SYRINGES 30G X 5/16" 0.3 ML MISC USE ONCE A DAY FOR INSULIN 12/06/17   Glean Hess, MD  glucose blood (RELION PRIME TEST) test strip Test twice a day 08/18/17   Glean Hess, MD  hydrALAZINE (APRESOLINE) 100 MG tablet Take 100 mg by mouth 3 (three) times daily. 10/23/20   [provider]  HYDROcodone-acetaminophen (NORCO) 5-325 MG tablet Take 1-2 tablets by mouth every 6 (six) hours as needed. Patient taking differently: Take 1-2 tablets by mouth every 6  (six) hours as needed (pain). 08/04/20   Schnier, Dolores Lory, MD  isosorbide mononitrate (IMDUR) 60 MG 24 hr tablet Take 60 mg by mouth in the morning. (0700) 10/23/20   [provider]  LORazepam (ATIVAN) 0.5 MG tablet Take 0.25 mg by mouth 2 (two) times daily.    [provider]  losartan (COZAAR) 100 MG tablet Take 1 tablet (100 mg total) by mouth daily. 08/29/20   Minna Merritts, MD  Melatonin 5 MG CAPS Take 2 capsules (10 mg total) by mouth at bedtime. Patient taking differently: Take 5 mg by mouth at bedtime. (2000) 06/29/18   Glean Hess, MD  metoprolol succinate (TOPROL-XL) 100 MG 24 hr tablet Take 1 tablet (100 mg total) by mouth daily. Patient taking differently: Take 100 mg by mouth daily. (0700) 08/29/20   Minna Merritts, MD  mirtazapine (REMERON) 15 MG tablet Take  1 tablet (15 mg total) by mouth at bedtime. 02/16/18   Glean Hess, MD  omeprazole (PRILOSEC) 40 MG capsule TAKE ONE CAPSULE TWICE A DAY Patient taking differently: Take 40 mg by mouth in the morning and at bedtime. 01/24/18   Glean Hess, MD  polyethylene glycol powder (GLYCOLAX/MIRALAX) powder Take 17 g by mouth 3 (three) times a week. 06/29/18   Glean Hess, MD  simvastatin (ZOCOR) 40 MG tablet Take 1 tablet (40 mg total) by mouth at bedtime. (2000) 08/29/20   Gollan, Kathlene November, MD    Allergies    Patient has no known allergies.  Review of Systems   Review of Systems  Unable to perform ROS: Acuity of condition    Physical Exam Updated Vital Signs BP (!) 96/45   Pulse (!) 58   Temp (!) 97.4 F (36.3 C) (Axillary)   Resp 14   Ht 1.626 m ('5\' 4"' )   SpO2 100%   BMI 19.40 kg/m   Physical Exam Vitals reviewed.  Constitutional:      Comments: Patient is intubated with head of bed elevated  HENT:     Right Ear: External ear normal.     Left Ear: External ear normal.     Mouth/Throat:     Comments: ET tube in place Eyes:     Comments: Right eye is swollen closed with some  oozing noted  Cardiovascular:     Rate and Rhythm: Normal rate.  Pulmonary:     Comments: Patient is intubated with clear lung sounds Abdominal:     Palpations: Abdomen is soft.  Skin:    General: Skin is warm.     ED Results / Procedures / Treatments   Labs (all labs ordered are listed, but only abnormal results are displayed) Labs Reviewed - No data to display  EKG None  Radiology CT Head Wo Contrast  Result Date: 12/13/2020 CLINICAL DATA:  80 year old female with facial trauma. EXAM: CT HEAD WITHOUT CONTRAST CT MAXILLOFACIAL WITHOUT CONTRAST CT CERVICAL SPINE WITHOUT CONTRAST TECHNIQUE: Multidetector CT imaging of the head, cervical spine, and maxillofacial structures were performed using the standard protocol without intravenous contrast. Multiplanar CT image reconstructions of the cervical spine and maxillofacial structures were also generated. COMPARISON:  Head CT dated 01/25/2017. FINDINGS: CT HEAD FINDINGS Brain: There is a large right frontal intraparenchymal hemorrhage measuring 2.2 x 6.0 cm in greatest axial dimension. There is extension of intraparenchymal hemorrhage into the ventricular system extending from the lateral ventricles into the third and fourth ventricles. There is mild dilatation of the ventricular system. Probable small subdural hemorrhage along the right anterior falx. There is moderate age-related atrophy and chronic microvascular ischemic changes. No significant midline shift. Vascular: No hyperdense vessel or unexpected calcification. Skull: Normal. Negative for fracture or focal lesion. Other: Right periorbital hematoma. CT MAXILLOFACIAL FINDINGS Osseous: There is a minimally depressed fracture of the right orbital floor. There is diastasis of the right frontozygomatic suture. Old fracture of the left lamina Propecia. Orbits: There is slight irregularity of the right globe. High attenuating content within the right globe consistent with hemorrhage. The right  lens has been displaced and not visualized. There is mild right exophthalmos. The retro-orbital fat are preserved. Sinuses: There is facet a shin of several ethmoid air cells. The maxillary sinuses and mastoid air cells are clear. Soft tissues: Right periorbital hematoma with several pockets of soft tissue air as well as small right orbital emphysema. There is laceration of the soft tissues  of the lateral aspect of the right face and right periorbital region. Ophthalmology consult is advised. CT CERVICAL SPINE FINDINGS Alignment: No acute subluxation. There is reversal of normal cervical lordosis which may be positional or due to muscle spasm Skull base and vertebrae: No acute fracture. Osteopenia Soft tissues and spinal canal: No prevertebral fluid or swelling. No visible canal hematoma. Disc levels:  Multilevel degenerative changes. Upper chest: Negative. Other: There is a 12 mm left thyroid hypodense nodule. Bilateral carotid bulb calcified plaques. IMPRESSION: 1. Large right frontal intraparenchymal hemorrhage with extension of hemorrhage into the ventricular system. Mild dilatation of the ventricular system. No significant midline shift. Probable small right anterior parafalcine subdural hemorrhage. 2. No acute/traumatic cervical spine pathology. 3. Minimally depressed fracture of the right orbital floor and diastasis of the right frontozygomatic suture. 4. Right globe hemorrhage with slight irregularity of the right globe. The right lens has been displaced and not visualized. Ophthalmology consult is advised. 5. Right periorbital hematoma with several pockets of soft tissue air as well as small right orbital emphysema. These results were called by telephone at the time of interpretation on 12/08/2020 at 7:49 pm to provider [facility] , who verbally acknowledged these results. Electronically Signed   By: Anner Crete M.D.   On: 12/06/2020 20:00   CT Cervical Spine Wo Contrast  Result Date:  12/22/2020 CLINICAL DATA:  80 year old female with facial trauma. EXAM: CT HEAD WITHOUT CONTRAST CT MAXILLOFACIAL WITHOUT CONTRAST CT CERVICAL SPINE WITHOUT CONTRAST TECHNIQUE: Multidetector CT imaging of the head, cervical spine, and maxillofacial structures were performed using the standard protocol without intravenous contrast. Multiplanar CT image reconstructions of the cervical spine and maxillofacial structures were also generated. COMPARISON:  Head CT dated 01/25/2017. FINDINGS: CT HEAD FINDINGS Brain: There is a large right frontal intraparenchymal hemorrhage measuring 2.2 x 6.0 cm in greatest axial dimension. There is extension of intraparenchymal hemorrhage into the ventricular system extending from the lateral ventricles into the third and fourth ventricles. There is mild dilatation of the ventricular system. Probable small subdural hemorrhage along the right anterior falx. There is moderate age-related atrophy and chronic microvascular ischemic changes. No significant midline shift. Vascular: No hyperdense vessel or unexpected calcification. Skull: Normal. Negative for fracture or focal lesion. Other: Right periorbital hematoma. CT MAXILLOFACIAL FINDINGS Osseous: There is a minimally depressed fracture of the right orbital floor. There is diastasis of the right frontozygomatic suture. Old fracture of the left lamina Propecia. Orbits: There is slight irregularity of the right globe. High attenuating content within the right globe consistent with hemorrhage. The right lens has been displaced and not visualized. There is mild right exophthalmos. The retro-orbital fat are preserved. Sinuses: There is facet a shin of several ethmoid air cells. The maxillary sinuses and mastoid air cells are clear. Soft tissues: Right periorbital hematoma with several pockets of soft tissue air as well as small right orbital emphysema. There is laceration of the soft tissues of the lateral aspect of the right face and right  periorbital region. Ophthalmology consult is advised. CT CERVICAL SPINE FINDINGS Alignment: No acute subluxation. There is reversal of normal cervical lordosis which may be positional or due to muscle spasm Skull base and vertebrae: No acute fracture. Osteopenia Soft tissues and spinal canal: No prevertebral fluid or swelling. No visible canal hematoma. Disc levels:  Multilevel degenerative changes. Upper chest: Negative. Other: There is a 12 mm left thyroid hypodense nodule. Bilateral carotid bulb calcified plaques. IMPRESSION: 1. Large right frontal intraparenchymal hemorrhage with extension  of hemorrhage into the ventricular system. Mild dilatation of the ventricular system. No significant midline shift. Probable small right anterior parafalcine subdural hemorrhage. 2. No acute/traumatic cervical spine pathology. 3. Minimally depressed fracture of the right orbital floor and diastasis of the right frontozygomatic suture. 4. Right globe hemorrhage with slight irregularity of the right globe. The right lens has been displaced and not visualized. Ophthalmology consult is advised. 5. Right periorbital hematoma with several pockets of soft tissue air as well as small right orbital emphysema. These results were called by telephone at the time of interpretation on 12/20/2020 at 7:49 pm to provider [facility] , who verbally acknowledged these results. Electronically Signed   By: Anner Crete M.D.   On: 12/22/2020 20:00   DG Chest Port 1 View  Result Date: 12/09/2020 CLINICAL DATA:  80 year old female status post intubation EXAM: PORTABLE CHEST 1 VIEW COMPARISON:  Chest radiograph dated 04/04/2020. FINDINGS: Endotracheal tube with tip at the thoracic inlet approximately 9 cm above the carina. The tube can be further advanced by 2 cm for optimal positioning. Enteric tube with side-port at the level of the GE junction. Recommend further advancing by additional 6 cm. No focal consolidation, pleural effusion, or  pneumothorax. Mild cardiomegaly. Atherosclerotic calcification of the aorta. No acute osseous pathology. IMPRESSION: 1. Endotracheal tube above the carina. 2. Enteric tube with side-port at the GE junction. Recommend further advancing by additional 6 cm. Electronically Signed   By: Anner Crete M.D.   On: 12/15/2020 20:55   CT Maxillofacial WO CM  Result Date: 12/17/2020 CLINICAL DATA:  80 year old female with facial trauma. EXAM: CT HEAD WITHOUT CONTRAST CT MAXILLOFACIAL WITHOUT CONTRAST CT CERVICAL SPINE WITHOUT CONTRAST TECHNIQUE: Multidetector CT imaging of the head, cervical spine, and maxillofacial structures were performed using the standard protocol without intravenous contrast. Multiplanar CT image reconstructions of the cervical spine and maxillofacial structures were also generated. COMPARISON:  Head CT dated 01/25/2017. FINDINGS: CT HEAD FINDINGS Brain: There is a large right frontal intraparenchymal hemorrhage measuring 2.2 x 6.0 cm in greatest axial dimension. There is extension of intraparenchymal hemorrhage into the ventricular system extending from the lateral ventricles into the third and fourth ventricles. There is mild dilatation of the ventricular system. Probable small subdural hemorrhage along the right anterior falx. There is moderate age-related atrophy and chronic microvascular ischemic changes. No significant midline shift. Vascular: No hyperdense vessel or unexpected calcification. Skull: Normal. Negative for fracture or focal lesion. Other: Right periorbital hematoma. CT MAXILLOFACIAL FINDINGS Osseous: There is a minimally depressed fracture of the right orbital floor. There is diastasis of the right frontozygomatic suture. Old fracture of the left lamina Propecia. Orbits: There is slight irregularity of the right globe. High attenuating content within the right globe consistent with hemorrhage. The right lens has been displaced and not visualized. There is mild right  exophthalmos. The retro-orbital fat are preserved. Sinuses: There is facet a shin of several ethmoid air cells. The maxillary sinuses and mastoid air cells are clear. Soft tissues: Right periorbital hematoma with several pockets of soft tissue air as well as small right orbital emphysema. There is laceration of the soft tissues of the lateral aspect of the right face and right periorbital region. Ophthalmology consult is advised. CT CERVICAL SPINE FINDINGS Alignment: No acute subluxation. There is reversal of normal cervical lordosis which may be positional or due to muscle spasm Skull base and vertebrae: No acute fracture. Osteopenia Soft tissues and spinal canal: No prevertebral fluid or swelling. No visible canal  hematoma. Disc levels:  Multilevel degenerative changes. Upper chest: Negative. Other: There is a 12 mm left thyroid hypodense nodule. Bilateral carotid bulb calcified plaques. IMPRESSION: 1. Large right frontal intraparenchymal hemorrhage with extension of hemorrhage into the ventricular system. Mild dilatation of the ventricular system. No significant midline shift. Probable small right anterior parafalcine subdural hemorrhage. 2. No acute/traumatic cervical spine pathology. 3. Minimally depressed fracture of the right orbital floor and diastasis of the right frontozygomatic suture. 4. Right globe hemorrhage with slight irregularity of the right globe. The right lens has been displaced and not visualized. Ophthalmology consult is advised. 5. Right periorbital hematoma with several pockets of soft tissue air as well as small right orbital emphysema. These results were called by telephone at the time of interpretation on 12/01/2020 at 7:49 pm to provider [facility] , who verbally acknowledged these results. Electronically Signed   By: Anner Crete M.D.   On: 12/07/2020 20:00    Procedures Procedures   Medications Ordered in ED Medications - No data to display  ED Course  I have reviewed  the triage vital signs and the nursing notes.  Pertinent labs & imaging results that were available during my care of the patient were reviewed by me and considered in my medical decision making (see chart for details).    MDM Rules/Calculators/A&P                         Labs reviewed: HGB 8.4 stable from first prior of 11/22/20 but <from 08/04/20 at 13.6 Creatinine-2.53>2.09 11/22/20 Remainder electrolytes wnl Discussed with Dr. Kieth Brightly who will see patient for admission  Final Clinical Impression(s) / ED Diagnoses Final diagnoses:  Traumatic hemorrhage of right cerebrum with loss of consciousness, initial encounter [facility])    Rx / DC Orders ED Discharge Orders    None       Pattricia Boss, MD 11/30/2020 2251

## 2020-12-18 NOTE — ED Notes (Signed)
50 of roc in

## 2020-12-18 NOTE — ED Notes (Signed)
65mg of fentanyl  11m/kg/min of prop

## 2020-12-18 NOTE — ED Triage Notes (Signed)
Pt to ED [facility] from [place] in [place] Baseline alzheimers dementia Pt fell and hit R eye. EMS thinks may have globe rupture. Pt has bandage with cup protecting eye. EMS states that pt has "blood clot coming out of R eye the size of my pinkie". Pt requiring constant redirecting to not pick at face. L arm restricted, dialysis port. NKA, hx HTN, alzheimers.  Manuela Schwartz, PA at bedside. R eye is swollen shut with clotted blood coming out of eye.

## 2020-12-18 NOTE — ED Notes (Signed)
Cheri Fowler, MD intubating now.

## 2020-12-18 NOTE — ED Notes (Signed)
Patient to be going to OR with opthalmology shortly

## 2020-12-18 NOTE — H&P (Signed)
Activation and Reason: transfer, fall  Primary Survey: ETT in place, breath sounds present bilaterally, distal pulses intact  Alyssa [name] is an 80 y.o. female.  HPI: (from chart review) 80 yo female with multiple medical problems fell from standing height. She hit the corner of her desk. She was diagnosed with right ruptured globe and large intraparenchymal hemorrhage.   Past Medical History:  Diagnosis Date  . Chronic systolic CHF (congestive heart failure) ([place])   . CKD (chronic kidney disease), stage III ([place])   . Dementia ([place])   . Essential hypertension   . Hyperlipidemia   . Hypertensive Non-ischemic Cardiomyopathy    a. 2010 Echo: EF 55%;  b. 2010 borderline MV w/ apical thinning; c. 2010 Cath: LCX 50p->Med Rx;  d. 09/2014 Echo: EF 35-40% w/ Sev LVH and glob HK;  e. 09/2014 MV: EF 30-44%, small fixed apical defect (breast attenuation), no ischemia.  . Hypokalemia   . Type II diabetes mellitus ([place])     Past Surgical History:  Procedure Laterality Date  . ABDOMINAL HYSTERECTOMY    . AV FISTULA PLACEMENT Left 08/04/2020   Procedure: INSERTION OF ARTERIOVENOUS (AV) GORE-TEX GRAFT ARM   ( BRACHIAL AXILLARY);  Surgeon: Katha Cabal, MD;  Location: [facility];  Service: Vascular;  Laterality: Left;  . CHOLECYSTECTOMY    . DIALYSIS/PERMA CATHETER INSERTION N/A 03/28/2020   Procedure: DIALYSIS/PERMA CATHETER INSERTION;  Surgeon: Katha Cabal, MD;  Location: [facility];  Service: Cardiovascular;  Laterality: N/A;  . DIALYSIS/PERMA CATHETER INSERTION N/A 10/13/2020   Procedure: DIALYSIS/PERMA CATHETER INSERTION;  Surgeon: Katha Cabal, MD;  Location: [place] [facility];  Service: Cardiovascular;  Laterality: N/A;  . DIALYSIS/PERMA CATHETER REMOVAL N/A 12/12/2020   Procedure: DIALYSIS/PERMA CATHETER REMOVAL;  Surgeon: Katha Cabal, MD;  Location: [facility];  Service: Cardiovascular;  Laterality: N/A;  . TONSILLECTOMY      Family History   Problem Relation Age of Onset  . Diabetes Mother   . Hypertension Mother   . Diabetes Father   . Hypertension Father   . Breast cancer Neg Hx     Social History:  reports that she has quit smoking. Her smoking use included cigarettes. She has a 2.50 pack-year smoking history. She has quit using smokeless tobacco.  Her smokeless tobacco use included chew. She reports previous alcohol use of about 1.0 standard drink of alcohol per week. She reports that she does not use drugs.  Allergies: No Known Allergies  Medications: I have reviewed the patient's current medications.  Results for orders placed or performed during the hospital encounter of 12/12/2020 (from the past 48 hour(s))  I-Stat arterial blood gas, ED     Status: Abnormal   Collection Time: 12/02/2020 11:00 PM  Result Value Ref Range   pH, Arterial 7.435 7.350 - 7.450   pCO2 arterial 44.5 32.0 - 48.0 mmHg   pO2, Arterial 186 (H) 83.0 - 108.0 mmHg   Bicarbonate 30.0 (H) 20.0 - 28.0 mmol/L   TCO2 31 22 - 32 mmol/L   O2 Saturation 100.0 %   Acid-Base Excess 5.0 (H) 0.0 - 2.0 mmol/L   Sodium 140 135 - 145 mmol/L   Potassium 3.1 (L) 3.5 - 5.1 mmol/L   Calcium, Ion 1.09 (L) 1.15 - 1.40 mmol/L   HCT 24.0 (L) 36.0 - 46.0 %   Hemoglobin 8.2 (L) 12.0 - 15.0 g/dL   Patient temperature 97.4 F    Collection site Radial  Drawn by RT    Sample type ARTERIAL     CT Head Wo Contrast  Result Date: 12/17/2020 CLINICAL DATA:  80 year old female with facial trauma. EXAM: CT HEAD WITHOUT CONTRAST CT MAXILLOFACIAL WITHOUT CONTRAST CT CERVICAL SPINE WITHOUT CONTRAST TECHNIQUE: Multidetector CT imaging of the head, cervical spine, and maxillofacial structures were performed using the standard protocol without intravenous contrast. Multiplanar CT image reconstructions of the cervical spine and maxillofacial structures were also generated. COMPARISON:  Head CT dated 01/25/2017. FINDINGS: CT HEAD FINDINGS Brain: There is a large right frontal  intraparenchymal hemorrhage measuring 2.2 x 6.0 cm in greatest axial dimension. There is extension of intraparenchymal hemorrhage into the ventricular system extending from the lateral ventricles into the third and fourth ventricles. There is mild dilatation of the ventricular system. Probable small subdural hemorrhage along the right anterior falx. There is moderate age-related atrophy and chronic microvascular ischemic changes. No significant midline shift. Vascular: No hyperdense vessel or unexpected calcification. Skull: Normal. Negative for fracture or focal lesion. Other: Right periorbital hematoma. CT MAXILLOFACIAL FINDINGS Osseous: There is a minimally depressed fracture of the right orbital floor. There is diastasis of the right frontozygomatic suture. Old fracture of the left lamina Propecia. Orbits: There is slight irregularity of the right globe. High attenuating content within the right globe consistent with hemorrhage. The right lens has been displaced and not visualized. There is mild right exophthalmos. The retro-orbital fat are preserved. Sinuses: There is facet a shin of several ethmoid air cells. The maxillary sinuses and mastoid air cells are clear. Soft tissues: Right periorbital hematoma with several pockets of soft tissue air as well as small right orbital emphysema. There is laceration of the soft tissues of the lateral aspect of the right face and right periorbital region. Ophthalmology consult is advised. CT CERVICAL SPINE FINDINGS Alignment: No acute subluxation. There is reversal of normal cervical lordosis which may be positional or due to muscle spasm Skull base and vertebrae: No acute fracture. Osteopenia Soft tissues and spinal canal: No prevertebral fluid or swelling. No visible canal hematoma. Disc levels:  Multilevel degenerative changes. Upper chest: Negative. Other: There is a 12 mm left thyroid hypodense nodule. Bilateral carotid bulb calcified plaques. IMPRESSION: 1. Large  right frontal intraparenchymal hemorrhage with extension of hemorrhage into the ventricular system. Mild dilatation of the ventricular system. No significant midline shift. Probable small right anterior parafalcine subdural hemorrhage. 2. No acute/traumatic cervical spine pathology. 3. Minimally depressed fracture of the right orbital floor and diastasis of the right frontozygomatic suture. 4. Right globe hemorrhage with slight irregularity of the right globe. The right lens has been displaced and not visualized. Ophthalmology consult is advised. 5. Right periorbital hematoma with several pockets of soft tissue air as well as small right orbital emphysema. These results were called by telephone at the time of interpretation on 12/11/2020 at 7:49 pm to provider [facility] , who verbally acknowledged these results. Electronically Signed   By: Anner Crete M.D.   On: 12/07/2020 20:00   CT Cervical Spine Wo Contrast  Result Date: 12/06/2020 CLINICAL DATA:  80 year old female with facial trauma. EXAM: CT HEAD WITHOUT CONTRAST CT MAXILLOFACIAL WITHOUT CONTRAST CT CERVICAL SPINE WITHOUT CONTRAST TECHNIQUE: Multidetector CT imaging of the head, cervical spine, and maxillofacial structures were performed using the standard protocol without intravenous contrast. Multiplanar CT image reconstructions of the cervical spine and maxillofacial structures were also generated. COMPARISON:  Head CT dated 01/25/2017. FINDINGS: CT HEAD FINDINGS Brain: There is a large right frontal  intraparenchymal hemorrhage measuring 2.2 x 6.0 cm in greatest axial dimension. There is extension of intraparenchymal hemorrhage into the ventricular system extending from the lateral ventricles into the third and fourth ventricles. There is mild dilatation of the ventricular system. Probable small subdural hemorrhage along the right anterior falx. There is moderate age-related atrophy and chronic microvascular ischemic changes. No significant  midline shift. Vascular: No hyperdense vessel or unexpected calcification. Skull: Normal. Negative for fracture or focal lesion. Other: Right periorbital hematoma. CT MAXILLOFACIAL FINDINGS Osseous: There is a minimally depressed fracture of the right orbital floor. There is diastasis of the right frontozygomatic suture. Old fracture of the left lamina Propecia. Orbits: There is slight irregularity of the right globe. High attenuating content within the right globe consistent with hemorrhage. The right lens has been displaced and not visualized. There is mild right exophthalmos. The retro-orbital fat are preserved. Sinuses: There is facet a shin of several ethmoid air cells. The maxillary sinuses and mastoid air cells are clear. Soft tissues: Right periorbital hematoma with several pockets of soft tissue air as well as small right orbital emphysema. There is laceration of the soft tissues of the lateral aspect of the right face and right periorbital region. Ophthalmology consult is advised. CT CERVICAL SPINE FINDINGS Alignment: No acute subluxation. There is reversal of normal cervical lordosis which may be positional or due to muscle spasm Skull base and vertebrae: No acute fracture. Osteopenia Soft tissues and spinal canal: No prevertebral fluid or swelling. No visible canal hematoma. Disc levels:  Multilevel degenerative changes. Upper chest: Negative. Other: There is a 12 mm left thyroid hypodense nodule. Bilateral carotid bulb calcified plaques. IMPRESSION: 1. Large right frontal intraparenchymal hemorrhage with extension of hemorrhage into the ventricular system. Mild dilatation of the ventricular system. No significant midline shift. Probable small right anterior parafalcine subdural hemorrhage. 2. No acute/traumatic cervical spine pathology. 3. Minimally depressed fracture of the right orbital floor and diastasis of the right frontozygomatic suture. 4. Right globe hemorrhage with slight irregularity of the  right globe. The right lens has been displaced and not visualized. Ophthalmology consult is advised. 5. Right periorbital hematoma with several pockets of soft tissue air as well as small right orbital emphysema. These results were called by telephone at the time of interpretation on 12/11/2020 at 7:49 pm to provider [facility] , who verbally acknowledged these results. Electronically Signed   By: Anner Crete M.D.   On: 12/16/2020 20:00   DG Chest Port 1 View  Result Date: 12/14/2020 CLINICAL DATA:  80 year old female status post intubation EXAM: PORTABLE CHEST 1 VIEW COMPARISON:  Chest radiograph dated 04/04/2020. FINDINGS: Endotracheal tube with tip at the thoracic inlet approximately 9 cm above the carina. The tube can be further advanced by 2 cm for optimal positioning. Enteric tube with side-port at the level of the GE junction. Recommend further advancing by additional 6 cm. No focal consolidation, pleural effusion, or pneumothorax. Mild cardiomegaly. Atherosclerotic calcification of the aorta. No acute osseous pathology. IMPRESSION: 1. Endotracheal tube above the carina. 2. Enteric tube with side-port at the GE junction. Recommend further advancing by additional 6 cm. Electronically Signed   By: Anner Crete M.D.   On: 12/09/2020 20:55   CT Maxillofacial WO CM  Result Date: 12/17/2020 CLINICAL DATA:  80 year old female with facial trauma. EXAM: CT HEAD WITHOUT CONTRAST CT MAXILLOFACIAL WITHOUT CONTRAST CT CERVICAL SPINE WITHOUT CONTRAST TECHNIQUE: Multidetector CT imaging of the head, cervical spine, and maxillofacial structures were performed using the standard protocol  without intravenous contrast. Multiplanar CT image reconstructions of the cervical spine and maxillofacial structures were also generated. COMPARISON:  Head CT dated 01/25/2017. FINDINGS: CT HEAD FINDINGS Brain: There is a large right frontal intraparenchymal hemorrhage measuring 2.2 x 6.0 cm in greatest axial dimension.  There is extension of intraparenchymal hemorrhage into the ventricular system extending from the lateral ventricles into the third and fourth ventricles. There is mild dilatation of the ventricular system. Probable small subdural hemorrhage along the right anterior falx. There is moderate age-related atrophy and chronic microvascular ischemic changes. No significant midline shift. Vascular: No hyperdense vessel or unexpected calcification. Skull: Normal. Negative for fracture or focal lesion. Other: Right periorbital hematoma. CT MAXILLOFACIAL FINDINGS Osseous: There is a minimally depressed fracture of the right orbital floor. There is diastasis of the right frontozygomatic suture. Old fracture of the left lamina Propecia. Orbits: There is slight irregularity of the right globe. High attenuating content within the right globe consistent with hemorrhage. The right lens has been displaced and not visualized. There is mild right exophthalmos. The retro-orbital fat are preserved. Sinuses: There is facet a shin of several ethmoid air cells. The maxillary sinuses and mastoid air cells are clear. Soft tissues: Right periorbital hematoma with several pockets of soft tissue air as well as small right orbital emphysema. There is laceration of the soft tissues of the lateral aspect of the right face and right periorbital region. Ophthalmology consult is advised. CT CERVICAL SPINE FINDINGS Alignment: No acute subluxation. There is reversal of normal cervical lordosis which may be positional or due to muscle spasm Skull base and vertebrae: No acute fracture. Osteopenia Soft tissues and spinal canal: No prevertebral fluid or swelling. No visible canal hematoma. Disc levels:  Multilevel degenerative changes. Upper chest: Negative. Other: There is a 12 mm left thyroid hypodense nodule. Bilateral carotid bulb calcified plaques. IMPRESSION: 1. Large right frontal intraparenchymal hemorrhage with extension of hemorrhage into the  ventricular system. Mild dilatation of the ventricular system. No significant midline shift. Probable small right anterior parafalcine subdural hemorrhage. 2. No acute/traumatic cervical spine pathology. 3. Minimally depressed fracture of the right orbital floor and diastasis of the right frontozygomatic suture. 4. Right globe hemorrhage with slight irregularity of the right globe. The right lens has been displaced and not visualized. Ophthalmology consult is advised. 5. Right periorbital hematoma with several pockets of soft tissue air as well as small right orbital emphysema. These results were called by telephone at the time of interpretation on 11/28/2020 at 7:49 pm to provider [facility] , who verbally acknowledged these results. Electronically Signed   By: Anner Crete M.D.   On: 12/16/2020 20:00    Review of Systems  Unable to perform ROS: Acuity of condition    PE Blood pressure (!) 128/47, pulse 61, temperature (!) 97.4 F (36.3 C), temperature source Axillary, resp. rate 11, height '5\' 4"'$  (1.626 m), SpO2 99 %. Constitutional: intubated and sedated, right globe reddened and swollen Eyes: right sclera red and rough, dilated, left eye constricted Neck: Trachea midline; no thyromegaly, unable to assess cervicalgia Lungs: assisted respiratory effort; no tactile fremitus CV: RRR; no palpable thrills; no pitting edema, left BC fistula with thrill GI: Abd nontender; no palpable hepatosplenomegaly MSK: unable to assess gait; no clubbing/cyanosis Psychiatric: nonresponsive Lymphatic: No palpable cervical or axillary lymphadenopathy Neuro: does not move extremities to pain, does not open eyes, +gag reflex   Assessment/Plan: 80 yo female with ESRD, dementia fell  R globe ruptured - consult optho large IPH -  consult neurosurgery  R orbital floor fracture - consult ENT ESRD - nephrology for dialysis Admit to ICU Goals of care discussion with DSS  Alyssa [name] 12/14/2020, 11:07  PM

## 2020-12-18 NOTE — ED Provider Notes (Signed)
[facility] Emergency Department Provider Note   ____________________________________________   Event Date/Time   First MD Initiated Contact with Patient 12/20/2020 1806     (approximate)  I have reviewed the triage vital signs and the nursing notes.   HISTORY  Chief Complaint Eye Injury    HPI Alyssa [name] is a 80 y.o. female with the below stated past medical history who presents from a long-term care facility via EMS after a witnessed fall in which patient tried to stand and fell forward onto the corner of a desk striking her right eye resulting in a large hematoma.  Patient has a significant history of dementia and has been unable to interact and EMS or my history or review of systems.  EMS states they were unable to open patient's eye on the right side.         Past Medical History:  Diagnosis Date  . Chronic systolic CHF (congestive heart failure) ([place])   . CKD (chronic kidney disease), stage III ([place])   . Dementia ([place])   . Essential hypertension   . Hyperlipidemia   . Hypertensive Non-ischemic Cardiomyopathy    a. 2010 Echo: EF 55%;  b. 2010 borderline MV w/ apical thinning; c. 2010 Cath: LCX 50p->Med Rx;  d. 09/2014 Echo: EF 35-40% w/ Sev LVH and glob HK;  e. 09/2014 MV: EF 30-44%, small fixed apical defect (breast attenuation), no ischemia.  . Hypokalemia   . Type II diabetes mellitus [facility])     Patient Active Problem List   Diagnosis Date Noted  . End stage renal disease ([place]) 06/29/2020  . Complication of vascular access for dialysis 06/29/2020  . Secondary hyperparathyroidism of renal origin ([place]) 02/08/2020  . Anemia in chronic kidney disease 12/02/2019  . Benign hypertensive kidney disease with chronic kidney disease 12/02/2019  . Hypocalcemia 12/02/2019  . Proteinuria 12/02/2019  . Insomnia 06/29/2018  . Constipation 06/29/2018  . Tobacco use disorder 05/25/2018  . Closed fracture of right distal humerus 04/15/2017  . History  of falling 02/11/2017  . Hip pain, left 01/31/2017  . Cardiomyopathy due to hypertension, without heart failure ([place]) 09/23/2016  . Cardiac murmur 06/03/2016  . Hyperlipidemia associated with type 2 diabetes mellitus ([place])   . Alzheimer's dementia without behavioral disturbance ([place]) 03/07/2015  . Coronary artery disease involving native coronary artery of native heart without angina pectoris 12/02/2014  . Allergic rhinitis 11/15/2014  . Essential hypertension 11/15/2014  . Arthritis, degenerative 11/15/2014  . Hypokalemia 11/15/2014  . H/O gastrointestinal hemorrhage 03/28/2011  . Diabetes ([place]) 03/28/2011    Past Surgical History:  Procedure Laterality Date  . ABDOMINAL HYSTERECTOMY    . AV FISTULA PLACEMENT Left 08/04/2020   Procedure: INSERTION OF ARTERIOVENOUS (AV) GORE-TEX GRAFT ARM   ( BRACHIAL AXILLARY);  Surgeon: Katha Cabal, MD;  Location: [facility] ORS;  Service: Vascular;  Laterality: Left;  . CHOLECYSTECTOMY    . DIALYSIS/PERMA CATHETER INSERTION N/A 03/28/2020   Procedure: DIALYSIS/PERMA CATHETER INSERTION;  Surgeon: Katha Cabal, MD;  Location: [place] CV LAB;  Service: Cardiovascular;  Laterality: N/A;  . DIALYSIS/PERMA CATHETER INSERTION N/A 10/13/2020   Procedure: DIALYSIS/PERMA CATHETER INSERTION;  Surgeon: Katha Cabal, MD;  Location: [place] [facility];  Service: Cardiovascular;  Laterality: N/A;  . DIALYSIS/PERMA CATHETER REMOVAL N/A 12/12/2020   Procedure: DIALYSIS/PERMA CATHETER REMOVAL;  Surgeon: Katha Cabal, MD;  Location: [place] [facility];  Service: Cardiovascular;  Laterality: N/A;  . TONSILLECTOMY  Prior to Admission medications   Medication Sig Start Date End Date Taking? Authorizing Provider  amLODipine (NORVASC) 10 MG tablet Take 1 tablet (10 mg total) by mouth daily. (0700) 08/29/20   Minna Merritts, MD  aspirin EC 81 MG tablet Take 81 mg by mouth daily. (0700)    [provider]  Blood Glucose Monitoring  Suppl (TRUETRACK BLOOD GLUCOSE) w/Device KIT 1 each by Does not apply route daily. 12/06/15   Glean Hess, MD  Calcium Carb-Cholecalciferol (CALCIUM 600+D3) 600-800 MG-UNIT TABS Take 1 tablet by mouth daily. (0700)    [provider]  cloNIDine (CATAPRES) 0.1 MG tablet Take 0.1 mg by mouth daily as needed (SBP freater than or equal to 180).    [provider]  cloNIDine (CATAPRES) 0.2 MG tablet Take 1 tablet (0.2 mg total) by mouth 3 (three) times daily. 08/29/20   Minna Merritts, MD  donepezil (ARICEPT) 10 MG tablet Take 10 mg by mouth at bedtime. (2000)    [provider]  ferrous sulfate 325 (65 FE) MG tablet Take 325 mg by mouth daily with breakfast. (0700)    [provider]  glipiZIDE (GLUCOTROL) 5 MG tablet Take 5 mg by mouth daily. (0700)    [provider]  GLOBAL INSULIN SYRINGES 30G X 5/16" 0.3 ML MISC USE ONCE A DAY FOR INSULIN 12/06/17   Glean Hess, MD  glucose blood (RELION PRIME TEST) test strip Test twice a day 08/18/17   Glean Hess, MD  hydrALAZINE (APRESOLINE) 100 MG tablet Take 100 mg by mouth 3 (three) times daily. 10/23/20   [provider]  HYDROcodone-acetaminophen (NORCO) 5-325 MG tablet Take 1-2 tablets by mouth every 6 (six) hours as needed. Patient taking differently: Take 1-2 tablets by mouth every 6 (six) hours as needed (pain). 08/04/20   Schnier, Dolores Lory, MD  isosorbide mononitrate (IMDUR) 60 MG 24 hr tablet Take 60 mg by mouth in the morning. (0700) 10/23/20   [provider]  LORazepam (ATIVAN) 0.5 MG tablet Take 0.25 mg by mouth 2 (two) times daily.    [provider]  losartan (COZAAR) 100 MG tablet Take 1 tablet (100 mg total) by mouth daily. 08/29/20   Minna Merritts, MD  Melatonin 5 MG CAPS Take 2 capsules (10 mg total) by mouth at bedtime. Patient taking differently: Take 5 mg by mouth at bedtime. (2000) 06/29/18   Glean Hess, MD  metoprolol succinate (TOPROL-XL) 100  MG 24 hr tablet Take 1 tablet (100 mg total) by mouth daily. Patient taking differently: Take 100 mg by mouth daily. (0700) 08/29/20   Minna Merritts, MD  mirtazapine (REMERON) 15 MG tablet Take 1 tablet (15 mg total) by mouth at bedtime. 02/16/18   Glean Hess, MD  omeprazole (PRILOSEC) 40 MG capsule TAKE ONE CAPSULE TWICE A DAY Patient taking differently: Take 40 mg by mouth in the morning and at bedtime. 01/24/18   Glean Hess, MD  polyethylene glycol powder (GLYCOLAX/MIRALAX) powder Take 17 g by mouth 3 (three) times a week. 06/29/18   Glean Hess, MD  simvastatin (ZOCOR) 40 MG tablet Take 1 tablet (40 mg total) by mouth at bedtime. (2000) 08/29/20   Gollan, Kathlene November, MD    Allergies Patient has no known allergies.  Family History  Problem Relation Age of Onset  . Diabetes Mother   . Hypertension Mother   . Diabetes Father   . Hypertension Father   . Breast  cancer Neg Hx     Social History Social History   Tobacco Use  . Smoking status: Former Smoker    Packs/day: 0.25    Years: 10.00    Pack years: 2.50    Types: Cigarettes  . Smokeless tobacco: Former Systems developer    Types: Secondary school teacher  . Vaping Use: Never used  Substance Use Topics  . Alcohol use: Not Currently    Alcohol/week: 1.0 standard drink    Types: 1 Cans of beer per week  . Drug use: No    Review of Systems Unable to assess ____________________________________________   PHYSICAL EXAM:  VITAL SIGNS: ED Triage Vitals  Enc Vitals Group     BP 11/27/2020 1800 (!) 191/150     Pulse Rate 12/22/2020 1759 63     Resp 11/27/2020 1759 18     Temp 11/30/2020 1759 98.5 F (36.9 C)     Temp Source 11/27/2020 1759 Oral     SpO2 12/26/2020 1755 97 %     Weight 11/29/2020 1757 113 lb (51.3 kg)     Height 11/28/2020 1757 5' 4" (1.626 m)     Head Circumference --      Peak Flow --      Pain Score --      Pain Loc --      Pain Edu? --      Excl. in [place]? --    Constitutional: Lying still on stretcher with a  plastic cup covering her right eye. Eyes: Left eye conjunctival is injected with round pupil at 4 mm and reactive to light.  Right eye with significant periorbital swelling/ecchymosis, the globe shows significant chemosis with active hemorrhage Head: Right facial and periorbital ecchymosis and edema Nose: No congestion/rhinnorhea. Mouth/Throat: Mucous membranes are moist. Neck: No stridor Cardiovascular: Grossly normal heart sounds.  Good peripheral circulation. Respiratory: Normal respiratory effort.  No retractions. Gastrointestinal: Soft and nontender. No distention. Musculoskeletal: No obvious deformities Neurologic: GCS 10, moving all extremities spontaneously Skin:  Skin is warm and dry. No rash noted. Psychiatric: Uncooperative  ____________________________________________   LABS (all labs ordered are listed, but only abnormal results are displayed)  Labs Reviewed  CBC WITH DIFFERENTIAL/PLATELET - Abnormal; Notable for the following components:      Result Value   RBC 2.59 (*)    Hemoglobin 8.4 (*)    HCT 25.5 (*)    All other components within normal limits  BASIC METABOLIC PANEL - Abnormal; Notable for the following components:   Creatinine, Ser 2.53 (*)    Calcium 8.3 (*)    GFR, Estimated 19 (*)    All other components within normal limits  RESP PANEL BY RT-PCR (FLU A&B, COVID) ARPGX2  PROTIME-INR  BLOOD GAS, ARTERIAL  TYPE AND SCREEN   ____________________________________________  EKG  ED ECG REPORT I, Naaman Plummer, the attending physician, personally viewed and interpreted this ECG.  Date: 12/26/2020 EKG Time: 2026 Rate: 72 Rhythm: normal sinus rhythm QRS Axis: normal Intervals: normal ST/T Wave abnormalities: normal Narrative Interpretation: no evidence of acute ischemia  ____________________________________________  RADIOLOGY  ED MD interpretation: CT of the brain without contrast shows a large right frontal intraparenchymal hemorrhage as well  as a small subdural hemorrhage  CT of the maxillofacial structures shows a depressed fracture of the right orbital floor and diastases of the right frontal zygomatic suture as well as a right globe hemorrhage with slight irregularity of the globe and displacement of the lens.  There is also  a large periorbital hematoma  CT of the cervical spine without contrast shows no evidence of acute abnormalities  Official radiology report(s): CT Head Wo Contrast  Result Date: 12/04/2020 CLINICAL DATA:  80 year old female with facial trauma. EXAM: CT HEAD WITHOUT CONTRAST CT MAXILLOFACIAL WITHOUT CONTRAST CT CERVICAL SPINE WITHOUT CONTRAST TECHNIQUE: Multidetector CT imaging of the head, cervical spine, and maxillofacial structures were performed using the standard protocol without intravenous contrast. Multiplanar CT image reconstructions of the cervical spine and maxillofacial structures were also generated. COMPARISON:  Head CT dated 01/25/2017. FINDINGS: CT HEAD FINDINGS Brain: There is a large right frontal intraparenchymal hemorrhage measuring 2.2 x 6.0 cm in greatest axial dimension. There is extension of intraparenchymal hemorrhage into the ventricular system extending from the lateral ventricles into the third and fourth ventricles. There is mild dilatation of the ventricular system. Probable small subdural hemorrhage along the right anterior falx. There is moderate age-related atrophy and chronic microvascular ischemic changes. No significant midline shift. Vascular: No hyperdense vessel or unexpected calcification. Skull: Normal. Negative for fracture or focal lesion. Other: Right periorbital hematoma. CT MAXILLOFACIAL FINDINGS Osseous: There is a minimally depressed fracture of the right orbital floor. There is diastasis of the right frontozygomatic suture. Old fracture of the left lamina Propecia. Orbits: There is slight irregularity of the right globe. High attenuating content within the right globe  consistent with hemorrhage. The right lens has been displaced and not visualized. There is mild right exophthalmos. The retro-orbital fat are preserved. Sinuses: There is facet a shin of several ethmoid air cells. The maxillary sinuses and mastoid air cells are clear. Soft tissues: Right periorbital hematoma with several pockets of soft tissue air as well as small right orbital emphysema. There is laceration of the soft tissues of the lateral aspect of the right face and right periorbital region. Ophthalmology consult is advised. CT CERVICAL SPINE FINDINGS Alignment: No acute subluxation. There is reversal of normal cervical lordosis which may be positional or due to muscle spasm Skull base and vertebrae: No acute fracture. Osteopenia Soft tissues and spinal canal: No prevertebral fluid or swelling. No visible canal hematoma. Disc levels:  Multilevel degenerative changes. Upper chest: Negative. Other: There is a 12 mm left thyroid hypodense nodule. Bilateral carotid bulb calcified plaques. IMPRESSION: 1. Large right frontal intraparenchymal hemorrhage with extension of hemorrhage into the ventricular system. Mild dilatation of the ventricular system. No significant midline shift. Probable small right anterior parafalcine subdural hemorrhage. 2. No acute/traumatic cervical spine pathology. 3. Minimally depressed fracture of the right orbital floor and diastasis of the right frontozygomatic suture. 4. Right globe hemorrhage with slight irregularity of the right globe. The right lens has been displaced and not visualized. Ophthalmology consult is advised. 5. Right periorbital hematoma with several pockets of soft tissue air as well as small right orbital emphysema. These results were called by telephone at the time of interpretation on 12/20/2020 at 7:49 pm to provider [facility] , who verbally acknowledged these results. Electronically Signed   By: Anner Crete M.D.   On: 12/07/2020 20:00   CT Cervical Spine Wo  Contrast  Result Date: 12/23/2020 CLINICAL DATA:  80 year old female with facial trauma. EXAM: CT HEAD WITHOUT CONTRAST CT MAXILLOFACIAL WITHOUT CONTRAST CT CERVICAL SPINE WITHOUT CONTRAST TECHNIQUE: Multidetector CT imaging of the head, cervical spine, and maxillofacial structures were performed using the standard protocol without intravenous contrast. Multiplanar CT image reconstructions of the cervical spine and maxillofacial structures were also generated. COMPARISON:  Head CT dated 01/25/2017. FINDINGS: CT  HEAD FINDINGS Brain: There is a large right frontal intraparenchymal hemorrhage measuring 2.2 x 6.0 cm in greatest axial dimension. There is extension of intraparenchymal hemorrhage into the ventricular system extending from the lateral ventricles into the third and fourth ventricles. There is mild dilatation of the ventricular system. Probable small subdural hemorrhage along the right anterior falx. There is moderate age-related atrophy and chronic microvascular ischemic changes. No significant midline shift. Vascular: No hyperdense vessel or unexpected calcification. Skull: Normal. Negative for fracture or focal lesion. Other: Right periorbital hematoma. CT MAXILLOFACIAL FINDINGS Osseous: There is a minimally depressed fracture of the right orbital floor. There is diastasis of the right frontozygomatic suture. Old fracture of the left lamina Propecia. Orbits: There is slight irregularity of the right globe. High attenuating content within the right globe consistent with hemorrhage. The right lens has been displaced and not visualized. There is mild right exophthalmos. The retro-orbital fat are preserved. Sinuses: There is facet a shin of several ethmoid air cells. The maxillary sinuses and mastoid air cells are clear. Soft tissues: Right periorbital hematoma with several pockets of soft tissue air as well as small right orbital emphysema. There is laceration of the soft tissues of the lateral aspect of  the right face and right periorbital region. Ophthalmology consult is advised. CT CERVICAL SPINE FINDINGS Alignment: No acute subluxation. There is reversal of normal cervical lordosis which may be positional or due to muscle spasm Skull base and vertebrae: No acute fracture. Osteopenia Soft tissues and spinal canal: No prevertebral fluid or swelling. No visible canal hematoma. Disc levels:  Multilevel degenerative changes. Upper chest: Negative. Other: There is a 12 mm left thyroid hypodense nodule. Bilateral carotid bulb calcified plaques. IMPRESSION: 1. Large right frontal intraparenchymal hemorrhage with extension of hemorrhage into the ventricular system. Mild dilatation of the ventricular system. No significant midline shift. Probable small right anterior parafalcine subdural hemorrhage. 2. No acute/traumatic cervical spine pathology. 3. Minimally depressed fracture of the right orbital floor and diastasis of the right frontozygomatic suture. 4. Right globe hemorrhage with slight irregularity of the right globe. The right lens has been displaced and not visualized. Ophthalmology consult is advised. 5. Right periorbital hematoma with several pockets of soft tissue air as well as small right orbital emphysema. These results were called by telephone at the time of interpretation on 11/30/2020 at 7:49 pm to provider [facility] , who verbally acknowledged these results. Electronically Signed   By: Anner Crete M.D.   On: 11/28/2020 20:00   CT Maxillofacial WO CM  Result Date: 12/12/2020 CLINICAL DATA:  80 year old female with facial trauma. EXAM: CT HEAD WITHOUT CONTRAST CT MAXILLOFACIAL WITHOUT CONTRAST CT CERVICAL SPINE WITHOUT CONTRAST TECHNIQUE: Multidetector CT imaging of the head, cervical spine, and maxillofacial structures were performed using the standard protocol without intravenous contrast. Multiplanar CT image reconstructions of the cervical spine and maxillofacial structures were also  generated. COMPARISON:  Head CT dated 01/25/2017. FINDINGS: CT HEAD FINDINGS Brain: There is a large right frontal intraparenchymal hemorrhage measuring 2.2 x 6.0 cm in greatest axial dimension. There is extension of intraparenchymal hemorrhage into the ventricular system extending from the lateral ventricles into the third and fourth ventricles. There is mild dilatation of the ventricular system. Probable small subdural hemorrhage along the right anterior falx. There is moderate age-related atrophy and chronic microvascular ischemic changes. No significant midline shift. Vascular: No hyperdense vessel or unexpected calcification. Skull: Normal. Negative for fracture or focal lesion. Other: Right periorbital hematoma. CT MAXILLOFACIAL FINDINGS Osseous: There  is a minimally depressed fracture of the right orbital floor. There is diastasis of the right frontozygomatic suture. Old fracture of the left lamina Propecia. Orbits: There is slight irregularity of the right globe. High attenuating content within the right globe consistent with hemorrhage. The right lens has been displaced and not visualized. There is mild right exophthalmos. The retro-orbital fat are preserved. Sinuses: There is facet a shin of several ethmoid air cells. The maxillary sinuses and mastoid air cells are clear. Soft tissues: Right periorbital hematoma with several pockets of soft tissue air as well as small right orbital emphysema. There is laceration of the soft tissues of the lateral aspect of the right face and right periorbital region. Ophthalmology consult is advised. CT CERVICAL SPINE FINDINGS Alignment: No acute subluxation. There is reversal of normal cervical lordosis which may be positional or due to muscle spasm Skull base and vertebrae: No acute fracture. Osteopenia Soft tissues and spinal canal: No prevertebral fluid or swelling. No visible canal hematoma. Disc levels:  Multilevel degenerative changes. Upper chest: Negative. Other:  There is a 12 mm left thyroid hypodense nodule. Bilateral carotid bulb calcified plaques. IMPRESSION: 1. Large right frontal intraparenchymal hemorrhage with extension of hemorrhage into the ventricular system. Mild dilatation of the ventricular system. No significant midline shift. Probable small right anterior parafalcine subdural hemorrhage. 2. No acute/traumatic cervical spine pathology. 3. Minimally depressed fracture of the right orbital floor and diastasis of the right frontozygomatic suture. 4. Right globe hemorrhage with slight irregularity of the right globe. The right lens has been displaced and not visualized. Ophthalmology consult is advised. 5. Right periorbital hematoma with several pockets of soft tissue air as well as small right orbital emphysema. These results were called by telephone at the time of interpretation on 12/04/2020 at 7:49 pm to provider [facility] , who verbally acknowledged these results. Electronically Signed   By: Anner Crete M.D.   On: 11/26/2020 20:00    ____________________________________________   PROCEDURES  Procedure(s) performed (including Critical Care):  Procedure Name: Intubation Date/Time: 12/25/2020 8:58 PM Performed by: Naaman Plummer, MD Pre-anesthesia Checklist: Patient identified, Patient being monitored, Emergency Drugs available, Timeout performed and Suction available Oxygen Delivery Method: Non-rebreather mask Preoxygenation: Pre-oxygenation with 100% oxygen Induction Type: Rapid sequence Ventilation: Mask ventilation without difficulty Laryngoscope Size: Glidescope Grade View: Grade I Tube size: 7.0 mm Number of attempts: 2 Airway Equipment and Method: Video-laryngoscopy Placement Confirmation: ETT inserted through vocal cords under direct vision,  CO2 detector and Breath sounds checked- equal and bilateral Secured at: 20 cm Tube secured with: ETT holder Dental Injury: Teeth and Oropharynx as per pre-operative assessment      .Critical Care Performed by: Naaman Plummer, MD Authorized by: Naaman Plummer, MD   Critical care provider statement:    Critical care time (minutes):  52   Critical care time was exclusive of:  Separately billable procedures and treating other patients   Critical care was necessary to treat or prevent imminent or life-threatening deterioration of the following conditions:  CNS failure or compromise   Critical care was time spent personally by me on the following activities:  Discussions with consultants, evaluation of patient's response to treatment, examination of patient, ordering and performing treatments and interventions, ordering and review of laboratory studies, ordering and review of radiographic studies, pulse oximetry, re-evaluation of patient's condition, obtaining history from patient or surrogate and review of old charts   I assumed direction of critical care for this patient from another provider  in my specialty: no     Care discussed with: admitting provider       ____________________________________________   INITIAL IMPRESSION / ASSESSMENT AND PLAN / ED COURSE  As part of my medical decision making, I reviewed the following data within the [place] notes reviewed and incorporated, Labs reviewed, EKG interpreted, Old chart reviewed, Radiograph reviewed and Notes from prior ED visits reviewed and incorporated     80 year old female presents via EMS after a witnessed fall striking her head and specifically right eye with resulting hematoma  Unlikely infectious etiology or changes secondary to ingestion  Workup: POCT glucose. CBC, BMP, PT/INR, PTT, troponin, type and screen ECG and non-contrast head CT  Findings: CT Brain: intracranial hemorrhage in the right frontal intraparenchymal region as well as right frontal subdural and right globe trauma ECG: No cerebral T waves. No STEMI  Interventions: BP Control: Nicardipine 49m/hr by  slow infusion (544mhr) titrating to maximum of 3011mr with permissive hypertension (SBP goal 180) Intubation Consults[facility]      ____________________________________________   FINAL CLINICAL IMPRESSION(S) / ED DIAGNOSES  Final diagnoses:  Intraparenchymal hematoma of brain due to trauma, right with loss of consciousness, initial encounter ([id number] (subdural hematoma) (HCC)  Rupture of globe of right eye following blunt trauma, initial encounter     ED Discharge Orders    None       Note:  This document was prepared using Dragon voice recognition software and may include unintentional dictation errors.   BraNaaman PlummerD 12/15/2020 2100

## 2020-12-18 NOTE — ED Notes (Signed)
BP not reading. Still attempting to get BP for full VS.

## 2020-12-18 NOTE — ED Notes (Signed)
Pt started to vomit and mental status became worse. Cheri Fowler, MD notified. This RN, Ryane, RN, Liane Comber, RN and Cheri Fowler, MD preparing to intubate pt now.

## 2020-12-19 ENCOUNTER — Encounter (HOSPITAL_COMMUNITY): Admission: EM | Disposition: E | Payer: Self-pay | Source: Other Acute Inpatient Hospital

## 2020-12-19 ENCOUNTER — Encounter (HOSPITAL_COMMUNITY): Payer: Self-pay | Admitting: Anesthesiology

## 2020-12-19 DIAGNOSIS — Z515 Encounter for palliative care: Secondary | ICD-10-CM | POA: Diagnosis not present

## 2020-12-19 DIAGNOSIS — Z7189 Other specified counseling: Secondary | ICD-10-CM

## 2020-12-19 DIAGNOSIS — S06349A Traumatic hemorrhage of right cerebrum with loss of consciousness of unspecified duration, initial encounter: Secondary | ICD-10-CM | POA: Diagnosis not present

## 2020-12-19 LAB — PHOSPHORUS: Phosphorus: 3.6 mg/dL (ref 2.5–4.6)

## 2020-12-19 LAB — BASIC METABOLIC PANEL
Anion gap: 8 (ref 5–15)
BUN: 7 mg/dL — ABNORMAL LOW (ref 8–23)
CO2: 26 mmol/L (ref 22–32)
Calcium: 7.4 mg/dL — ABNORMAL LOW (ref 8.9–10.3)
Chloride: 105 mmol/L (ref 98–111)
Creatinine, Ser: 2.7 mg/dL — ABNORMAL HIGH (ref 0.44–1.00)
GFR, Estimated: 18 mL/min — ABNORMAL LOW (ref >60)
Glucose, Bld: 100 mg/dL — ABNORMAL HIGH (ref 70–99)
Potassium: 2.8 mmol/L — ABNORMAL LOW (ref 3.5–5.1)
Sodium: 139 mmol/L (ref 135–145)

## 2020-12-19 LAB — CBC
HCT: 27.5 % — ABNORMAL LOW (ref 36.0–46.0)
Hemoglobin: 8.8 g/dL — ABNORMAL LOW (ref 12.0–15.0)
MCH: 32.2 pg (ref 26.0–34.0)
MCHC: 32 g/dL (ref 30.0–36.0)
MCV: 100.7 fL — ABNORMAL HIGH (ref 80.0–100.0)
Platelets: 161 10*3/uL (ref 150–400)
RBC: 2.73 MIL/uL — ABNORMAL LOW (ref 3.87–5.11)
RDW: 15.1 % (ref 11.5–15.5)
WBC: 7.9 10*3/uL (ref 4.0–10.5)
nRBC: 0 % (ref 0.0–0.2)

## 2020-12-19 LAB — GLUCOSE, CAPILLARY
Glucose-Capillary: 83 mg/dL (ref 70–99)
Glucose-Capillary: 96 mg/dL (ref 70–99)
Glucose-Capillary: 109 mg/dL — ABNORMAL HIGH (ref 70–99)
Glucose-Capillary: 117 mg/dL — ABNORMAL HIGH (ref 70–99)

## 2020-12-19 LAB — HEMOGLOBIN A1C
Hgb A1c MFr Bld: 5.4 % (ref 4.8–5.6)
Mean Plasma Glucose: 108.28 mg/dL

## 2020-12-19 LAB — MAGNESIUM: Magnesium: 1.5 mg/dL — ABNORMAL LOW (ref 1.7–2.4)

## 2020-12-19 LAB — MRSA PCR SCREENING: MRSA by PCR: NEGATIVE

## 2020-12-19 LAB — POTASSIUM: Potassium: 6.1 mmol/L — ABNORMAL HIGH (ref 3.5–5.1)

## 2020-12-19 LAB — TRIGLYCERIDES: Triglycerides: 197 mg/dL — ABNORMAL HIGH (ref <150)

## 2020-12-19 SURGERY — REPAIR, RUPTURE, GLOBE
Anesthesia: General | Site: Eye | Laterality: Right

## 2020-12-19 MED ORDER — PREDNISOLONE ACETATE 1 % OP SUSP
1.0000 [drp] | Freq: Four times a day (QID) | OPHTHALMIC | Status: DC
  Administered 2020-12-19 (×3): 1 [drp] via OPHTHALMIC
  Filled 2020-12-19: qty 5

## 2020-12-19 MED ORDER — GLYCOPYRROLATE 0.2 MG/ML IJ SOLN: 0.2000 mg | INTRAMUSCULAR | Status: DC | PRN

## 2020-12-19 MED ORDER — GLYCOPYRROLATE 0.2 MG/ML IJ SOLN
0.2000 mg | INTRAMUSCULAR | Status: DC | PRN
  Administered 2020-12-20 – 2020-12-21 (×2): 0.2 mg via SUBCUTANEOUS
  Filled 2020-12-19 (×2): qty 1

## 2020-12-19 MED ORDER — HALOPERIDOL LACTATE 5 MG/ML IJ SOLN: 0.5000 mg | INTRAMUSCULAR | Status: DC | PRN

## 2020-12-19 MED ORDER — POTASSIUM CHLORIDE 20 MEQ PO PACK
40.0000 meq | PACK | ORAL | Status: DC
  Administered 2020-12-19 (×2): 40 meq
  Filled 2020-12-19 (×2): qty 2

## 2020-12-19 MED ORDER — CHLORHEXIDINE GLUCONATE 0.12% ORAL RINSE (MEDLINE KIT): 15.0000 mL | Freq: Two times a day (BID) | OROMUCOSAL | Status: DC

## 2020-12-19 MED ORDER — HALOPERIDOL 0.5 MG PO TABS
0.5000 mg | ORAL_TABLET | ORAL | Status: DC | PRN
  Filled 2020-12-19: qty 1

## 2020-12-19 MED ORDER — BIOTENE DRY MOUTH MT LIQD: 15.0000 mL | OROMUCOSAL | Status: DC | PRN

## 2020-12-19 MED ORDER — METOPROLOL TARTRATE 5 MG/5ML IV SOLN
5.0000 mg | Freq: Four times a day (QID) | INTRAVENOUS | Status: DC | PRN
  Administered 2020-12-19 – 2020-12-20 (×2): 5 mg via INTRAVENOUS
  Filled 2020-12-19 (×2): qty 5

## 2020-12-19 MED ORDER — GLYCOPYRROLATE 1 MG PO TABS: 1.0000 mg | ORAL_TABLET | ORAL | Status: DC | PRN

## 2020-12-19 MED ORDER — MAGNESIUM SULFATE 2 GM/50ML IV SOLN
2.0000 g | Freq: Once | INTRAVENOUS | Status: AC
  Administered 2020-12-19: 2 g via INTRAVENOUS
  Filled 2020-12-19: qty 50

## 2020-12-19 MED ORDER — HYDRALAZINE HCL 20 MG/ML IJ SOLN
10.0000 mg | Freq: Four times a day (QID) | INTRAMUSCULAR | Status: DC | PRN
  Administered 2020-12-19 (×2): 10 mg via INTRAVENOUS
  Filled 2020-12-19 (×2): qty 1

## 2020-12-19 MED ORDER — ORAL CARE MOUTH RINSE
15.0000 mL | OROMUCOSAL | Status: DC
  Administered 2020-12-19 (×7): 15 mL via OROMUCOSAL

## 2020-12-19 MED ORDER — CHLORHEXIDINE GLUCONATE 0.12% ORAL RINSE (MEDLINE KIT)
15.0000 mL | Freq: Two times a day (BID) | OROMUCOSAL | Status: DC
  Administered 2020-12-19 (×2): 15 mL via OROMUCOSAL

## 2020-12-19 MED ORDER — ONDANSETRON HCL 4 MG/2ML IJ SOLN: 4.0000 mg | Freq: Four times a day (QID) | INTRAMUSCULAR | Status: DC | PRN

## 2020-12-19 MED ORDER — ONDANSETRON 4 MG PO TBDP: 4.0000 mg | ORAL_TABLET | Freq: Four times a day (QID) | ORAL | Status: DC | PRN

## 2020-12-19 MED ORDER — ACETAMINOPHEN 650 MG RE SUPP: 650.0000 mg | Freq: Four times a day (QID) | RECTAL | Status: DC | PRN

## 2020-12-19 MED ORDER — HALOPERIDOL LACTATE 2 MG/ML PO CONC
0.5000 mg | ORAL | Status: DC | PRN
  Filled 2020-12-19: qty 0.3

## 2020-12-19 MED ORDER — POLYVINYL ALCOHOL 1.4 % OP SOLN
1.0000 [drp] | Freq: Four times a day (QID) | OPHTHALMIC | Status: DC | PRN
  Filled 2020-12-19: qty 15

## 2020-12-19 MED ORDER — ATROPINE SULFATE 1 % OP SOLN
1.0000 [drp] | Freq: Two times a day (BID) | OPHTHALMIC | Status: DC
  Administered 2020-12-19 (×2): 1 [drp] via OPHTHALMIC
  Filled 2020-12-19: qty 2

## 2020-12-19 MED ORDER — ACETAMINOPHEN 325 MG PO TABS: 650.0000 mg | ORAL_TABLET | Freq: Four times a day (QID) | ORAL | Status: DC | PRN

## 2020-12-19 MED ORDER — GATIFLOXACIN 0.5 % OP SOLN
1.0000 [drp] | Freq: Four times a day (QID) | OPHTHALMIC | Status: DC
  Administered 2020-12-19 (×3): 1 [drp] via OPHTHALMIC
  Filled 2020-12-19: qty 2.5

## 2020-12-19 MED ORDER — ORAL CARE MOUTH RINSE: 15.0000 mL | OROMUCOSAL | Status: DC

## 2020-12-19 MED ORDER — CHLORHEXIDINE GLUCONATE CLOTH 2 % EX PADS
6.0000 | MEDICATED_PAD | Freq: Every day | CUTANEOUS | Status: DC
  Administered 2020-12-19 – 2020-12-20 (×2): 6 via TOPICAL

## 2020-12-19 MED ORDER — HYDRALAZINE HCL 20 MG/ML IJ SOLN
10.0000 mg | Freq: Once | INTRAMUSCULAR | Status: AC
  Administered 2020-12-19: 10 mg via INTRAVENOUS
  Filled 2020-12-19: qty 1

## 2020-12-19 MED FILL — Medication: Qty: 1 | Status: AC

## 2020-12-19 NOTE — Procedures (Signed)
Extubation Procedure Note  Patient Details:   Name: Alyssa [name] DOB: 10-Feb-1941 MRN: [id number]   Airway Documentation:    Vent end date: 12/01/2020 Vent end time: 2042     Pt terminally extubated per MD order.  Clance Boll 11/27/2020, 8:50 PM

## 2020-12-19 NOTE — Progress Notes (Signed)
She grimaces to painful stimuli and flexes without localization on both sides.  She does open her eye on the left to painful stimuli.  She is not following commands.  The right eye is covered by ophthalmology.  We still feel that the prognosis for functional recovery is quite poor, especially given her premorbid status.  We recommend comfort care.

## 2020-12-19 NOTE — Consult Note (Signed)
Reason for Consult:ICH Referring Physician: Kinsinger  Alyssa [name] is an 80 y.o. female.   HPI:  80 year old female with an extensive history of CHF, ESRD, and dementia who lives in a group home apparently fell face forward onto a desk. The patient was taken to [place] and , per notes, decompensated there. She is a ward of the state and has no family at bedside. Mental status premorbid state is unknown.   Past Medical History:  Diagnosis Date  . Chronic systolic CHF (congestive heart failure) ([place])   . CKD (chronic kidney disease), stage III ([place])   . Dementia ([place])   . Essential hypertension   . Hyperlipidemia   . Hypertensive Non-ischemic Cardiomyopathy    a. 2010 Echo: EF 55%;  b. 2010 borderline MV w/ apical thinning; c. 2010 Cath: LCX 50p->Med Rx;  d. 09/2014 Echo: EF 35-40% w/ Sev LVH and glob HK;  e. 09/2014 MV: EF 30-44%, small fixed apical defect (breast attenuation), no ischemia.  . Hypokalemia   . Type II diabetes mellitus ([place])     Past Surgical History:  Procedure Laterality Date  . ABDOMINAL HYSTERECTOMY    . AV FISTULA PLACEMENT Left 08/04/2020   Procedure: INSERTION OF ARTERIOVENOUS (AV) GORE-TEX GRAFT ARM   ( BRACHIAL AXILLARY);  Surgeon: Katha Cabal, MD;  Location: [facility] ORS;  Service: Vascular;  Laterality: Left;  . CHOLECYSTECTOMY    . DIALYSIS/PERMA CATHETER INSERTION N/A 03/28/2020   Procedure: DIALYSIS/PERMA CATHETER INSERTION;  Surgeon: Katha Cabal, MD;  Location: [place] [facility];  Service: Cardiovascular;  Laterality: N/A;  . DIALYSIS/PERMA CATHETER INSERTION N/A 10/13/2020   Procedure: DIALYSIS/PERMA CATHETER INSERTION;  Surgeon: Katha Cabal, MD;  Location: [place] [facility];  Service: Cardiovascular;  Laterality: N/A;  . DIALYSIS/PERMA CATHETER REMOVAL N/A 12/12/2020   Procedure: DIALYSIS/PERMA CATHETER REMOVAL;  Surgeon: Katha Cabal, MD;  Location: [place] [facility];  Service: Cardiovascular;  Laterality: N/A;  .  TONSILLECTOMY      No Known Allergies  Social History   Tobacco Use  . Smoking status: Former Smoker    Packs/day: 0.25    Years: 10.00    Pack years: 2.50    Types: Cigarettes  . Smokeless tobacco: Former Systems developer    Types: Chew  Substance Use Topics  . Alcohol use: Not Currently    Alcohol/week: 1.0 standard drink    Types: 1 Cans of beer per week    Family History  Problem Relation Age of Onset  . Diabetes Mother   . Hypertension Mother   . Diabetes Father   . Hypertension Father   . Breast cancer Neg Hx      Review of Systems  Positive ROS: intubated and sedated  All other systems have been reviewed and were otherwise negative with the exception of those mentioned in the HPI and as above.  Objective: Vital signs in last 24 hours: Temp:  [95.9 F (35.5 C)-98.6 F (37 C)] 97.4 F (36.3 C) (05/23 2230) Pulse Rate:  [55-81] 55 (05/23 2345) Resp:  [11-22] 14 (05/23 2345) BP: (96-215)/(39-150) 135/52 (05/23 2345) SpO2:  [96 %-100 %] 100 % (05/23 2345) FiO2 (%):  [40 %-50 %] 40 % (05/23 2255) Weight:  [51.3 kg] 51.3 kg (05/23 1757)  General Appearance: obtunded Head: Normocephalic, without obvious abnormality, trauma to right eye Eyes: trauma to right eye, swollen shut  Throat: ETT Lungs: intubated,  respirations unlabored Heart: Regular rate and rhythm Extremities: Extremities normal, atraumatic, no cyanosis  or edema Pulses: 2+ and symmetric all extremities Skin: Skin color, texture, turgor normal, no rashes or lesions  NEUROLOGIC:   Mental status:obtunded and sedated Motor Exam - wtihdraws to noxious stimuli in all four extremities Sensory Exam -uta Reflexes: symmetric, no pathologic reflexes, No Hoffman's, No clonus Coordination - not tested Gait - not tested Balance - not tested Cranial Nerves: I: smell Not tested  II: visual acuity  OS: na    OD: na  II: visual fields uta  II: pupils Eyes swollen shu  III,VII: ptosis   III,IV,VI: extraocular  muscles  uta  V: mastication   V: facial light touch sensation    V,VII: corneal reflex  UTA  VII: facial muscle function - upper    VII: facial muscle function - lower   VIII: hearing   IX: soft palate elevation    IX,X: gag reflex Present  XI: trapezius strength    XI: sternocleidomastoid strength   XI: neck flexion strength    XII: tongue strength      Data Review Lab Results  Component Value Date   WBC 5.5 12/14/2020   HGB 8.2 (L) 12/24/2020   HCT 24.0 (L) 12/12/2020   MCV 98.5 11/28/2020   PLT 155 12/17/2020   Lab Results  Component Value Date   NA 140 12/07/2020   K 3.1 (L) 12/24/2020   CL 102 11/28/2020   CO2 31 12/04/2020   BUN 8 12/05/2020   CREATININE 2.53 (H) 11/30/2020   GLUCOSE 77 12/25/2020   Lab Results  Component Value Date   INR 1.0 12/15/2020    Radiology: CT Head Wo Contrast  Result Date: 12/25/2020 CLINICAL DATA:  80 year old female with facial trauma. EXAM: CT HEAD WITHOUT CONTRAST CT MAXILLOFACIAL WITHOUT CONTRAST CT CERVICAL SPINE WITHOUT CONTRAST TECHNIQUE: Multidetector CT imaging of the head, cervical spine, and maxillofacial structures were performed using the standard protocol without intravenous contrast. Multiplanar CT image reconstructions of the cervical spine and maxillofacial structures were also generated. COMPARISON:  Head CT dated 01/25/2017. FINDINGS: CT HEAD FINDINGS Brain: There is a large right frontal intraparenchymal hemorrhage measuring 2.2 x 6.0 cm in greatest axial dimension. There is extension of intraparenchymal hemorrhage into the ventricular system extending from the lateral ventricles into the third and fourth ventricles. There is mild dilatation of the ventricular system. Probable small subdural hemorrhage along the right anterior falx. There is moderate age-related atrophy and chronic microvascular ischemic changes. No significant midline shift. Vascular: No hyperdense vessel or unexpected calcification. Skull: Normal.  Negative for fracture or focal lesion. Other: Right periorbital hematoma. CT MAXILLOFACIAL FINDINGS Osseous: There is a minimally depressed fracture of the right orbital floor. There is diastasis of the right frontozygomatic suture. Old fracture of the left lamina Propecia. Orbits: There is slight irregularity of the right globe. High attenuating content within the right globe consistent with hemorrhage. The right lens has been displaced and not visualized. There is mild right exophthalmos. The retro-orbital fat are preserved. Sinuses: There is facet a shin of several ethmoid air cells. The maxillary sinuses and mastoid air cells are clear. Soft tissues: Right periorbital hematoma with several pockets of soft tissue air as well as small right orbital emphysema. There is laceration of the soft tissues of the lateral aspect of the right face and right periorbital region. Ophthalmology consult is advised. CT CERVICAL SPINE FINDINGS Alignment: No acute subluxation. There is reversal of normal cervical lordosis which may be positional or due to muscle spasm Skull base and vertebrae: No  acute fracture. Osteopenia Soft tissues and spinal canal: No prevertebral fluid or swelling. No visible canal hematoma. Disc levels:  Multilevel degenerative changes. Upper chest: Negative. Other: There is a 12 mm left thyroid hypodense nodule. Bilateral carotid bulb calcified plaques. IMPRESSION: 1. Large right frontal intraparenchymal hemorrhage with extension of hemorrhage into the ventricular system. Mild dilatation of the ventricular system. No significant midline shift. Probable small right anterior parafalcine subdural hemorrhage. 2. No acute/traumatic cervical spine pathology. 3. Minimally depressed fracture of the right orbital floor and diastasis of the right frontozygomatic suture. 4. Right globe hemorrhage with slight irregularity of the right globe. The right lens has been displaced and not visualized. Ophthalmology consult is  advised. 5. Right periorbital hematoma with several pockets of soft tissue air as well as small right orbital emphysema. These results were called by telephone at the time of interpretation on 12/13/2020 at 7:49 pm to provider [facility] , who verbally acknowledged these results. Electronically Signed   By: Anner Crete M.D.   On: 12/06/2020 20:00   CT Cervical Spine Wo Contrast  Result Date: 11/28/2020 CLINICAL DATA:  80 year old female with facial trauma. EXAM: CT HEAD WITHOUT CONTRAST CT MAXILLOFACIAL WITHOUT CONTRAST CT CERVICAL SPINE WITHOUT CONTRAST TECHNIQUE: Multidetector CT imaging of the head, cervical spine, and maxillofacial structures were performed using the standard protocol without intravenous contrast. Multiplanar CT image reconstructions of the cervical spine and maxillofacial structures were also generated. COMPARISON:  Head CT dated 01/25/2017. FINDINGS: CT HEAD FINDINGS Brain: There is a large right frontal intraparenchymal hemorrhage measuring 2.2 x 6.0 cm in greatest axial dimension. There is extension of intraparenchymal hemorrhage into the ventricular system extending from the lateral ventricles into the third and fourth ventricles. There is mild dilatation of the ventricular system. Probable small subdural hemorrhage along the right anterior falx. There is moderate age-related atrophy and chronic microvascular ischemic changes. No significant midline shift. Vascular: No hyperdense vessel or unexpected calcification. Skull: Normal. Negative for fracture or focal lesion. Other: Right periorbital hematoma. CT MAXILLOFACIAL FINDINGS Osseous: There is a minimally depressed fracture of the right orbital floor. There is diastasis of the right frontozygomatic suture. Old fracture of the left lamina Propecia. Orbits: There is slight irregularity of the right globe. High attenuating content within the right globe consistent with hemorrhage. The right lens has been displaced and not  visualized. There is mild right exophthalmos. The retro-orbital fat are preserved. Sinuses: There is facet a shin of several ethmoid air cells. The maxillary sinuses and mastoid air cells are clear. Soft tissues: Right periorbital hematoma with several pockets of soft tissue air as well as small right orbital emphysema. There is laceration of the soft tissues of the lateral aspect of the right face and right periorbital region. Ophthalmology consult is advised. CT CERVICAL SPINE FINDINGS Alignment: No acute subluxation. There is reversal of normal cervical lordosis which may be positional or due to muscle spasm Skull base and vertebrae: No acute fracture. Osteopenia Soft tissues and spinal canal: No prevertebral fluid or swelling. No visible canal hematoma. Disc levels:  Multilevel degenerative changes. Upper chest: Negative. Other: There is a 12 mm left thyroid hypodense nodule. Bilateral carotid bulb calcified plaques. IMPRESSION: 1. Large right frontal intraparenchymal hemorrhage with extension of hemorrhage into the ventricular system. Mild dilatation of the ventricular system. No significant midline shift. Probable small right anterior parafalcine subdural hemorrhage. 2. No acute/traumatic cervical spine pathology. 3. Minimally depressed fracture of the right orbital floor and diastasis of the right frontozygomatic suture.  4. Right globe hemorrhage with slight irregularity of the right globe. The right lens has been displaced and not visualized. Ophthalmology consult is advised. 5. Right periorbital hematoma with several pockets of soft tissue air as well as small right orbital emphysema. These results were called by telephone at the time of interpretation on 12/16/2020 at 7:49 pm to provider [facility] , who verbally acknowledged these results. Electronically Signed   By: Anner Crete M.D.   On: 11/30/2020 20:00   DG Chest Port 1 View  Result Date: 12/05/2020 CLINICAL DATA:  80 year old female status  post intubation EXAM: PORTABLE CHEST 1 VIEW COMPARISON:  Chest radiograph dated 04/04/2020. FINDINGS: Endotracheal tube with tip at the thoracic inlet approximately 9 cm above the carina. The tube can be further advanced by 2 cm for optimal positioning. Enteric tube with side-port at the level of the GE junction. Recommend further advancing by additional 6 cm. No focal consolidation, pleural effusion, or pneumothorax. Mild cardiomegaly. Atherosclerotic calcification of the aorta. No acute osseous pathology. IMPRESSION: 1. Endotracheal tube above the carina. 2. Enteric tube with side-port at the GE junction. Recommend further advancing by additional 6 cm. Electronically Signed   By: Anner Crete M.D.   On: 12/05/2020 20:55   CT Maxillofacial WO CM  Result Date: 12/20/2020 CLINICAL DATA:  80 year old female with facial trauma. EXAM: CT HEAD WITHOUT CONTRAST CT MAXILLOFACIAL WITHOUT CONTRAST CT CERVICAL SPINE WITHOUT CONTRAST TECHNIQUE: Multidetector CT imaging of the head, cervical spine, and maxillofacial structures were performed using the standard protocol without intravenous contrast. Multiplanar CT image reconstructions of the cervical spine and maxillofacial structures were also generated. COMPARISON:  Head CT dated 01/25/2017. FINDINGS: CT HEAD FINDINGS Brain: There is a large right frontal intraparenchymal hemorrhage measuring 2.2 x 6.0 cm in greatest axial dimension. There is extension of intraparenchymal hemorrhage into the ventricular system extending from the lateral ventricles into the third and fourth ventricles. There is mild dilatation of the ventricular system. Probable small subdural hemorrhage along the right anterior falx. There is moderate age-related atrophy and chronic microvascular ischemic changes. No significant midline shift. Vascular: No hyperdense vessel or unexpected calcification. Skull: Normal. Negative for fracture or focal lesion. Other: Right periorbital hematoma. CT  MAXILLOFACIAL FINDINGS Osseous: There is a minimally depressed fracture of the right orbital floor. There is diastasis of the right frontozygomatic suture. Old fracture of the left lamina Propecia. Orbits: There is slight irregularity of the right globe. High attenuating content within the right globe consistent with hemorrhage. The right lens has been displaced and not visualized. There is mild right exophthalmos. The retro-orbital fat are preserved. Sinuses: There is facet a shin of several ethmoid air cells. The maxillary sinuses and mastoid air cells are clear. Soft tissues: Right periorbital hematoma with several pockets of soft tissue air as well as small right orbital emphysema. There is laceration of the soft tissues of the lateral aspect of the right face and right periorbital region. Ophthalmology consult is advised. CT CERVICAL SPINE FINDINGS Alignment: No acute subluxation. There is reversal of normal cervical lordosis which may be positional or due to muscle spasm Skull base and vertebrae: No acute fracture. Osteopenia Soft tissues and spinal canal: No prevertebral fluid or swelling. No visible canal hematoma. Disc levels:  Multilevel degenerative changes. Upper chest: Negative. Other: There is a 12 mm left thyroid hypodense nodule. Bilateral carotid bulb calcified plaques. IMPRESSION: 1. Large right frontal intraparenchymal hemorrhage with extension of hemorrhage into the ventricular system. Mild dilatation of the ventricular  system. No significant midline shift. Probable small right anterior parafalcine subdural hemorrhage. 2. No acute/traumatic cervical spine pathology. 3. Minimally depressed fracture of the right orbital floor and diastasis of the right frontozygomatic suture. 4. Right globe hemorrhage with slight irregularity of the right globe. The right lens has been displaced and not visualized. Ophthalmology consult is advised. 5. Right periorbital hematoma with several pockets of soft tissue  air as well as small right orbital emphysema. These results were called by telephone at the time of interpretation on 12/17/2020 at 7:49 pm to provider [facility] , who verbally acknowledged these results. Electronically Signed   By: Anner Crete M.D.   On: 11/29/2020 20:00     Assessment/Plan: 80 year old female presented to the ED after sustaining a fall face forward into a desk. CT head shows a large right frontal intraparenchymal hemorrhage with extension into the ventricles. I do not think she is a surgical candidate at this point given her advanced medical history. I do think that a ventriculostomy would benefit either. Aggressive care would not be in her best interest. Would recommend comfort care.    Ocie Cornfield Logen Heintzelman 12/14/2020 12:16 AM

## 2020-12-19 NOTE — Progress Notes (Signed)
Trauma/Critical Care Follow Up Note  Subjective:    Overnight Issues:   Objective:  Vital signs for last 24 hours: Temp:  [95.9 F (35.5 C)-100.22 F (37.9 C)] 100.22 F (37.9 C) (05/24 0800) Pulse Rate:  [54-90] 90 (05/24 0800) Resp:  [11-27] 18 (05/24 0800) BP: (96-219)/(39-150) 219/64 (05/24 0800) SpO2:  [96 %-100 %] 100 % (05/24 0800) FiO2 (%):  [40 %-50 %] 40 % (05/24 0753) Weight:  [51.1 kg-51.3 kg] 51.1 kg (05/24 0400)  Hemodynamic parameters for last 24 hours:    Intake/Output from previous day: 05/23 0701 - 05/24 0700 In: 255.4 [I.V.:255.4] Out: 200 [Urine:200]  Intake/Output this shift: No intake/output data recorded.  Vent settings for last 24 hours: Vent Mode: PRVC FiO2 (%):  [40 %-50 %] 40 % Set Rate:  [14 bmp] 14 bmp Vt Set:  [400 mL] 400 mL PEEP:  [5 cmH20] 5 cmH20 Plateau Pressure:  [14 cmH20-17 cmH20] 14 cmH20  Physical Exam:  Gen: comfortable, no distress Neuro: not f/c HEENT: PERRL Neck: supple CV: RRR Pulm: unlabored breathing Abd: soft, NT GU: clear yellow urine Extr: wwp, no edema   Results for orders placed or performed during the hospital encounter of 11/28/2020 (from the past 24 hour(s))  I-Stat arterial blood gas, ED     Status: Abnormal   Collection Time: 12/08/2020 11:00 PM  Result Value Ref Range   pH, Arterial 7.435 7.350 - 7.450   pCO2 arterial 44.5 32.0 - 48.0 mmHg   pO2, Arterial 186 (H) 83.0 - 108.0 mmHg   Bicarbonate 30.0 (H) 20.0 - 28.0 mmol/L   TCO2 31 22 - 32 mmol/L   O2 Saturation 100.0 %   Acid-Base Excess 5.0 (H) 0.0 - 2.0 mmol/L   Sodium 140 135 - 145 mmol/L   Potassium 3.1 (L) 3.5 - 5.1 mmol/L   Calcium, Ion 1.09 (L) 1.15 - 1.40 mmol/L   HCT 24.0 (L) 36.0 - 46.0 %   Hemoglobin 8.2 (L) 12.0 - 15.0 g/dL   Patient temperature 97.4 F    Collection site Radial    Drawn by RT    Sample type ARTERIAL   CBG monitoring, ED     Status: Abnormal   Collection Time: 12/25/2020 11:33 PM  Result Value Ref Range    Glucose-Capillary 136 (H) 70 - 99 mg/dL  MRSA PCR Screening     Status: None   Collection Time: 12/23/2020  1:24 AM   Specimen: Nasal Mucosa; Nasopharyngeal  Result Value Ref Range   MRSA by PCR NEGATIVE NEGATIVE  CBC     Status: Abnormal   Collection Time: 12/06/2020  1:46 AM  Result Value Ref Range   WBC 7.9 4.0 - 10.5 K/uL   RBC 2.73 (L) 3.87 - 5.11 MIL/uL   Hemoglobin 8.8 (L) 12.0 - 15.0 g/dL   HCT 27.5 (L) 36.0 - 46.0 %   MCV 100.7 (H) 80.0 - 100.0 fL   MCH 32.2 26.0 - 34.0 pg   MCHC 32.0 30.0 - 36.0 g/dL   RDW 15.1 11.5 - 15.5 %   Platelets 161 150 - 400 K/uL   nRBC 0.0 0.0 - 0.2 %  Basic metabolic panel     Status: Abnormal   Collection Time: 12/10/2020  1:46 AM  Result Value Ref Range   Sodium 139 135 - 145 mmol/L   Potassium 2.8 (L) 3.5 - 5.1 mmol/L   Chloride 105 98 - 111 mmol/L   CO2 26 22 - 32 mmol/L   Glucose, Bld  100 (H) 70 - 99 mg/dL   BUN 7 (L) 8 - 23 mg/dL   Creatinine, Ser 2.70 (H) 0.44 - 1.00 mg/dL   Calcium 7.4 (L) 8.9 - 10.3 mg/dL   GFR, Estimated 18 (L) >60 mL/min   Anion gap 8 5 - 15  Triglycerides     Status: Abnormal   Collection Time: 12/26/2020  1:46 AM  Result Value Ref Range   Triglycerides 197 (H) <150 mg/dL  Hemoglobin A1c     Status: None   Collection Time: 12/02/2020  1:46 AM  Result Value Ref Range   Hgb A1c MFr Bld 5.4 4.8 - 5.6 %   Mean Plasma Glucose 108.28 mg/dL  Glucose, capillary     Status: None   Collection Time: 12/04/2020  3:13 AM  Result Value Ref Range   Glucose-Capillary 83 70 - 99 mg/dL  Glucose, capillary     Status: None   Collection Time: 12/01/2020  7:22 AM  Result Value Ref Range   Glucose-Capillary 96 70 - 99 mg/dL    Assessment & Plan: The plan of care was discussed with the bedside nurse for the day, who is in agreement with this plan and no additional concerns were raised.   Present on Admission: **None**    LOS: 1 day   Additional comments:I reviewed the patient's new clinical lab test results.   and I reviewed  the patients new imaging test results.    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 Ronnald Ramp, recommending against ventriculostomy, craniotomy and recommending comfort measures. Hyphema and lenticular dislocation of R eye with conjunctival laceration - ophtho c/s, Dr. Valetta Close, no definitive evidence for globe rupture or operative indication. Recommend vigamox QID pred forte 1% QID and atropine BID Hypertension - was previously on cardene gtt, now off, add PRN meds R orbital floor fracture - ENT not consulted due to poor prognosis MMP/ESRD - Renal c/s, however may defer HD due to poor prognosis FEN - replete hypokalemia  DVT - SCDs Dispo - ICU   Palliative consult and Ethics consult placed given the patient's prognosis and guardianship by the Department of Social Services.   Critical Care Total Time: 45 minutes  Jesusita Oka, MD Trauma & General Surgery Please use [email or web address] to contact on call provider  12/17/2020  *Care during the described time interval was provided by me. I have reviewed this patient's available data, including medical history, events of note, physical examination and test results as part of my evaluation.

## 2020-12-19 NOTE — Progress Notes (Signed)
Repeat K 6.1 this PM post KCL 73mq PT x 2 doses D/C remainder of order  Florentine Diekman D. DMina Marble PharmD, BCPS, BMerrimac5/24/2022, 5:36 PM

## 2020-12-19 NOTE — Progress Notes (Signed)
Pt's systolic b/p in [id number] this am, informed Lovick MD, PRN hydralazine and metoprolol ordered and given. Pt's systolic b/p remains high in 200's, Lovick MD notified, no new orders given at this time.

## 2020-12-19 NOTE — Consult Note (Addendum)
OPHTHALMOLOGY CONSULT NOTE  Date:5/24/22Time: 12:19 AM  Patient Name: Alyssa [name]  DOB: 06/14/1941 MRN: [id number]  Reason for Consult: Eye injury  HPI: This is a 80 y.o. Female with dementia who is s/p facial injury after fall. The patient has intracranial bleeding and is intubated and sedated. Ophthalmology was consulted due to concern for ruptured globe.   Prior to Admission medications   Medication Sig Start Date End Date Taking? Authorizing Provider  amLODipine (NORVASC) 10 MG tablet Take 1 tablet (10 mg total) by mouth daily. (0700) 08/29/20   Minna Merritts, MD  aspirin EC 81 MG tablet Take 81 mg by mouth daily. (0700)    [provider]  Blood Glucose Monitoring Suppl (TRUETRACK BLOOD GLUCOSE) w/Device KIT 1 each by Does not apply route daily. 12/06/15   Glean Hess, MD  Calcium Carb-Cholecalciferol (CALCIUM 600+D3) 600-800 MG-UNIT TABS Take 1 tablet by mouth daily. (0700)    [provider]  cloNIDine (CATAPRES) 0.1 MG tablet Take 0.1 mg by mouth daily as needed (SBP freater than or equal to 180).    [provider]  cloNIDine (CATAPRES) 0.2 MG tablet Take 1 tablet (0.2 mg total) by mouth 3 (three) times daily. 08/29/20   Minna Merritts, MD  donepezil (ARICEPT) 10 MG tablet Take 10 mg by mouth at bedtime. (2000)    [provider]  ferrous sulfate 325 (65 FE) MG tablet Take 325 mg by mouth daily with breakfast. (0700)    [provider]  glipiZIDE (GLUCOTROL) 5 MG tablet Take 5 mg by mouth daily. (0700)    [provider]  GLOBAL INSULIN SYRINGES 30G X 5/16" 0.3 ML MISC USE ONCE A DAY FOR INSULIN 12/06/17   Glean Hess, MD  glucose blood (RELION PRIME TEST) test strip Test twice a day 08/18/17   Glean Hess, MD  hydrALAZINE (APRESOLINE) 100 MG tablet Take 100 mg by mouth 3 (three) times daily. 10/23/20   [provider]  HYDROcodone-acetaminophen (NORCO) 5-325 MG tablet Take 1-2 tablets by mouth every 6  (six) hours as needed. Patient taking differently: Take 1-2 tablets by mouth every 6 (six) hours as needed (pain). 08/04/20   Schnier, Dolores Lory, MD  isosorbide mononitrate (IMDUR) 60 MG 24 hr tablet Take 60 mg by mouth in the morning. (0700) 10/23/20   [provider]  LORazepam (ATIVAN) 0.5 MG tablet Take 0.25 mg by mouth 2 (two) times daily.    [provider]  losartan (COZAAR) 100 MG tablet Take 1 tablet (100 mg total) by mouth daily. 08/29/20   Minna Merritts, MD  Melatonin 5 MG CAPS Take 2 capsules (10 mg total) by mouth at bedtime. Patient taking differently: Take 5 mg by mouth at bedtime. (2000) 06/29/18   Glean Hess, MD  metoprolol succinate (TOPROL-XL) 100 MG 24 hr tablet Take 1 tablet (100 mg total) by mouth daily. Patient taking differently: Take 100 mg by mouth daily. (0700) 08/29/20   Minna Merritts, MD  mirtazapine (REMERON) 15 MG tablet Take 1 tablet (15 mg total) by mouth at bedtime. 02/16/18   Glean Hess, MD  omeprazole (PRILOSEC) 40 MG capsule TAKE ONE CAPSULE TWICE A DAY Patient taking differently: Take 40 mg by mouth in the morning and at bedtime. 01/24/18   Glean Hess, MD  polyethylene glycol powder (GLYCOLAX/MIRALAX) powder Take 17 g by mouth 3 (three) times a week. 06/29/18   Glean Hess, MD  simvastatin (ZOCOR) 40  MG tablet Take 1 tablet (40 mg total) by mouth at bedtime. (2000) 08/29/20   Gollan, Kathlene November, MD    Past Medical History:  Diagnosis Date  . Chronic systolic CHF (congestive heart failure) ([place])   . CKD (chronic kidney disease), stage III ([place])   . Dementia ([place])   . Essential hypertension   . Hyperlipidemia   . Hypertensive Non-ischemic Cardiomyopathy    a. 2010 Echo: EF 55%;  b. 2010 borderline MV w/ apical thinning; c. 2010 Cath: LCX 50p->Med Rx;  d. 09/2014 Echo: EF 35-40% w/ Sev LVH and glob HK;  e. 09/2014 MV: EF 30-44%, small fixed apical defect (breast attenuation), no ischemia.  . Hypokalemia   . Type II  diabetes mellitus ([facility])     family history includes Diabetes in her father and mother; Hypertension in her father and mother.  Social History   Occupational History  . Not on file  Tobacco Use  . Smoking status: Former Smoker    Packs/day: 0.25    Years: 10.00    Pack years: 2.50    Types: Cigarettes  . Smokeless tobacco: Former Systems developer    Types: Secondary school teacher  . Vaping Use: Never used  Substance and Sexual Activity  . Alcohol use: Not Currently    Alcohol/week: 1.0 standard drink    Types: 1 Cans of beer per week  . Drug use: No  . Sexual activity: Not Currently    No Known Allergies  ROS: Positive as above, otherwise negative.  EXAM:  Mental Status:Intubated and sedated  Base Exam: Right Eye Left Eye  Visual Acuity (At near) N/A N/A  IOP (Tonopen) 14   Pupillary Exam N/A Miotic without reaction  Motility    Confrontation VF     Anterior Segment Exam    Lids/Lashes Periorbital hematoma WNL  Conjuctiva 360 bullous Archer with superior laceration without uveal tissue White and Quiet  Cornea Clear Clear  Anterior Chamber 100 percent hyphema Deep and Quiet  Iris No view Round, Reactive  Lens  Clear  Vitreous  WNL   Poster Segment Exam    Disc No view Good RR  CD ratio    Macula    Vessels    Periphery     Radiographic Studies Reviewed:  CT Head Wo Contrast  Result Date: 12/22/2020 CLINICAL DATA:  80 year old female with facial trauma. EXAM: CT HEAD WITHOUT CONTRAST CT MAXILLOFACIAL WITHOUT CONTRAST CT CERVICAL SPINE WITHOUT CONTRAST TECHNIQUE: Multidetector CT imaging of the head, cervical spine, and maxillofacial structures were performed using the standard protocol without intravenous contrast. Multiplanar CT image reconstructions of the cervical spine and maxillofacial structures were also generated. COMPARISON:  Head CT dated 01/25/2017. FINDINGS: CT HEAD FINDINGS Brain: There is a large right frontal intraparenchymal hemorrhage measuring 2.2 x 6.0 cm in  greatest axial dimension. There is extension of intraparenchymal hemorrhage into the ventricular system extending from the lateral ventricles into the third and fourth ventricles. There is mild dilatation of the ventricular system. Probable small subdural hemorrhage along the right anterior falx. There is moderate age-related atrophy and chronic microvascular ischemic changes. No significant midline shift. Vascular: No hyperdense vessel or unexpected calcification. Skull: Normal. Negative for fracture or focal lesion. Other: Right periorbital hematoma. CT MAXILLOFACIAL FINDINGS Osseous: There is a minimally depressed fracture of the right orbital floor. There is diastasis of the right frontozygomatic suture. Old fracture of the left lamina Propecia. Orbits: There is slight irregularity of the right globe. High attenuating content within  the right globe consistent with hemorrhage. The right lens has been displaced and not visualized. There is mild right exophthalmos. The retro-orbital fat are preserved. Sinuses: There is facet a shin of several ethmoid air cells. The maxillary sinuses and mastoid air cells are clear. Soft tissues: Right periorbital hematoma with several pockets of soft tissue air as well as small right orbital emphysema. There is laceration of the soft tissues of the lateral aspect of the right face and right periorbital region. Ophthalmology consult is advised. CT CERVICAL SPINE FINDINGS Alignment: No acute subluxation. There is reversal of normal cervical lordosis which may be positional or due to muscle spasm Skull base and vertebrae: No acute fracture. Osteopenia Soft tissues and spinal canal: No prevertebral fluid or swelling. No visible canal hematoma. Disc levels:  Multilevel degenerative changes. Upper chest: Negative. Other: There is a 12 mm left thyroid hypodense nodule. Bilateral carotid bulb calcified plaques. IMPRESSION: 1. Large right frontal intraparenchymal hemorrhage with extension  of hemorrhage into the ventricular system. Mild dilatation of the ventricular system. No significant midline shift. Probable small right anterior parafalcine subdural hemorrhage. 2. No acute/traumatic cervical spine pathology. 3. Minimally depressed fracture of the right orbital floor and diastasis of the right frontozygomatic suture. 4. Right globe hemorrhage with slight irregularity of the right globe. The right lens has been displaced and not visualized. Ophthalmology consult is advised. 5. Right periorbital hematoma with several pockets of soft tissue air as well as small right orbital emphysema. These results were called by telephone at the time of interpretation on 12/20/2020 at 7:49 pm to provider [facility] , who verbally acknowledged these results. Electronically Signed   By: Anner Crete M.D.   On: 12/25/2020 20:00   CT Cervical Spine Wo Contrast  Result Date: 12/23/2020 CLINICAL DATA:  80 year old female with facial trauma. EXAM: CT HEAD WITHOUT CONTRAST CT MAXILLOFACIAL WITHOUT CONTRAST CT CERVICAL SPINE WITHOUT CONTRAST TECHNIQUE: Multidetector CT imaging of the head, cervical spine, and maxillofacial structures were performed using the standard protocol without intravenous contrast. Multiplanar CT image reconstructions of the cervical spine and maxillofacial structures were also generated. COMPARISON:  Head CT dated 01/25/2017. FINDINGS: CT HEAD FINDINGS Brain: There is a large right frontal intraparenchymal hemorrhage measuring 2.2 x 6.0 cm in greatest axial dimension. There is extension of intraparenchymal hemorrhage into the ventricular system extending from the lateral ventricles into the third and fourth ventricles. There is mild dilatation of the ventricular system. Probable small subdural hemorrhage along the right anterior falx. There is moderate age-related atrophy and chronic microvascular ischemic changes. No significant midline shift. Vascular: No hyperdense vessel or unexpected  calcification. Skull: Normal. Negative for fracture or focal lesion. Other: Right periorbital hematoma. CT MAXILLOFACIAL FINDINGS Osseous: There is a minimally depressed fracture of the right orbital floor. There is diastasis of the right frontozygomatic suture. Old fracture of the left lamina Propecia. Orbits: There is slight irregularity of the right globe. High attenuating content within the right globe consistent with hemorrhage. The right lens has been displaced and not visualized. There is mild right exophthalmos. The retro-orbital fat are preserved. Sinuses: There is facet a shin of several ethmoid air cells. The maxillary sinuses and mastoid air cells are clear. Soft tissues: Right periorbital hematoma with several pockets of soft tissue air as well as small right orbital emphysema. There is laceration of the soft tissues of the lateral aspect of the right face and right periorbital region. Ophthalmology consult is advised. CT CERVICAL SPINE FINDINGS Alignment: No acute subluxation.  There is reversal of normal cervical lordosis which may be positional or due to muscle spasm Skull base and vertebrae: No acute fracture. Osteopenia Soft tissues and spinal canal: No prevertebral fluid or swelling. No visible canal hematoma. Disc levels:  Multilevel degenerative changes. Upper chest: Negative. Other: There is a 12 mm left thyroid hypodense nodule. Bilateral carotid bulb calcified plaques. IMPRESSION: 1. Large right frontal intraparenchymal hemorrhage with extension of hemorrhage into the ventricular system. Mild dilatation of the ventricular system. No significant midline shift. Probable small right anterior parafalcine subdural hemorrhage. 2. No acute/traumatic cervical spine pathology. 3. Minimally depressed fracture of the right orbital floor and diastasis of the right frontozygomatic suture. 4. Right globe hemorrhage with slight irregularity of the right globe. The right lens has been displaced and not  visualized. Ophthalmology consult is advised. 5. Right periorbital hematoma with several pockets of soft tissue air as well as small right orbital emphysema. These results were called by telephone at the time of interpretation on 11/30/2020 at 7:49 pm to provider [facility] , who verbally acknowledged these results. Electronically Signed   By: Anner Crete M.D.   On: 11/30/2020 20:00   DG Chest Port 1 View  Result Date: 12/02/2020 CLINICAL DATA:  80 year old female status post intubation EXAM: PORTABLE CHEST 1 VIEW COMPARISON:  Chest radiograph dated 04/04/2020. FINDINGS: Endotracheal tube with tip at the thoracic inlet approximately 9 cm above the carina. The tube can be further advanced by 2 cm for optimal positioning. Enteric tube with side-port at the level of the GE junction. Recommend further advancing by additional 6 cm. No focal consolidation, pleural effusion, or pneumothorax. Mild cardiomegaly. Atherosclerotic calcification of the aorta. No acute osseous pathology. IMPRESSION: 1. Endotracheal tube above the carina. 2. Enteric tube with side-port at the GE junction. Recommend further advancing by additional 6 cm. Electronically Signed   By: Anner Crete M.D.   On: 12/12/2020 20:55   CT Maxillofacial WO CM  Result Date: 12/20/2020 CLINICAL DATA:  80 year old female with facial trauma. EXAM: CT HEAD WITHOUT CONTRAST CT MAXILLOFACIAL WITHOUT CONTRAST CT CERVICAL SPINE WITHOUT CONTRAST TECHNIQUE: Multidetector CT imaging of the head, cervical spine, and maxillofacial structures were performed using the standard protocol without intravenous contrast. Multiplanar CT image reconstructions of the cervical spine and maxillofacial structures were also generated. COMPARISON:  Head CT dated 01/25/2017. FINDINGS: CT HEAD FINDINGS Brain: There is a large right frontal intraparenchymal hemorrhage measuring 2.2 x 6.0 cm in greatest axial dimension. There is extension of intraparenchymal hemorrhage into the  ventricular system extending from the lateral ventricles into the third and fourth ventricles. There is mild dilatation of the ventricular system. Probable small subdural hemorrhage along the right anterior falx. There is moderate age-related atrophy and chronic microvascular ischemic changes. No significant midline shift. Vascular: No hyperdense vessel or unexpected calcification. Skull: Normal. Negative for fracture or focal lesion. Other: Right periorbital hematoma. CT MAXILLOFACIAL FINDINGS Osseous: There is a minimally depressed fracture of the right orbital floor. There is diastasis of the right frontozygomatic suture. Old fracture of the left lamina Propecia. Orbits: There is slight irregularity of the right globe. High attenuating content within the right globe consistent with hemorrhage. The right lens has been displaced and not visualized. There is mild right exophthalmos. The retro-orbital fat are preserved. Sinuses: There is facet a shin of several ethmoid air cells. The maxillary sinuses and mastoid air cells are clear. Soft tissues: Right periorbital hematoma with several pockets of soft tissue air as well as small  right orbital emphysema. There is laceration of the soft tissues of the lateral aspect of the right face and right periorbital region. Ophthalmology consult is advised. CT CERVICAL SPINE FINDINGS Alignment: No acute subluxation. There is reversal of normal cervical lordosis which may be positional or due to muscle spasm Skull base and vertebrae: No acute fracture. Osteopenia Soft tissues and spinal canal: No prevertebral fluid or swelling. No visible canal hematoma. Disc levels:  Multilevel degenerative changes. Upper chest: Negative. Other: There is a 12 mm left thyroid hypodense nodule. Bilateral carotid bulb calcified plaques. IMPRESSION: 1. Large right frontal intraparenchymal hemorrhage with extension of hemorrhage into the ventricular system. Mild dilatation of the ventricular system.  No significant midline shift. Probable small right anterior parafalcine subdural hemorrhage. 2. No acute/traumatic cervical spine pathology. 3. Minimally depressed fracture of the right orbital floor and diastasis of the right frontozygomatic suture. 4. Right globe hemorrhage with slight irregularity of the right globe. The right lens has been displaced and not visualized. Ophthalmology consult is advised. 5. Right periorbital hematoma with several pockets of soft tissue air as well as small right orbital emphysema. These results were called by telephone at the time of interpretation on 12/26/2020 at 7:49 pm to provider [facility] , who verbally acknowledged these results. Electronically Signed   By: Anner Crete M.D.   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 Discussed case with radiologist on call and there is no clear angulation of the wall of the globe or obvious break in the wall of the globe. External exam demonstrates a formed globe with an IOP of 14. Given the patients serious medical co-morbidities, exploration in the OR is not recommended and I would recommend observation at this time. Given her conjuctival lac, I would recommend vigamox QID. For the management of her hyphema will start pred forte 1% QID as well as atropine BID.    Please call with any questions.  Jola Schmidt MD [facility] Ophthalmology [phone]

## 2020-12-19 NOTE — Anesthesia Preprocedure Evaluation (Deleted)
Anesthesia Evaluation  Patient identified by MRN, date of birth, ID band Patient awake    Reviewed: Allergy & Precautions, Patient's Chart, lab work & pertinent test resultsPreop documentation limited or incomplete due to emergent nature of procedure.  Airway Mallampati: Intubated       Dental  (+) Dental Advisory Given   Pulmonary former smoker,    Pulmonary exam normal        Cardiovascular hypertension, Pt. on medications and Pt. on home beta blockers +CHF  Normal cardiovascular exam     Neuro/Psych Dementia negative neurological ROS     GI/Hepatic negative GI ROS, Neg liver ROS,   Endo/Other  diabetes, Type 2  Renal/GU CRFRenal disease     Musculoskeletal  (+) Arthritis ,   Abdominal   Peds  Hematology  (+) anemia ,   Anesthesia Other Findings   Reproductive/Obstetrics                             Anesthesia Physical Anesthesia Plan  ASA: IV and emergent  Anesthesia Plan: General   Post-op Pain Management:    Induction: Intravenous  PONV Risk Score and Plan: 3 and Ondansetron, Dexamethasone and Treatment may vary due to age or medical condition  Airway Management Planned: Oral ETT  Additional Equipment:   Intra-op Plan:   Post-operative Plan: Post-operative intubation/ventilation  Informed Consent: I have reviewed the patients History and Physical, chart, labs and discussed the procedure including the risks, benefits and alternatives for the proposed anesthesia with the patient or authorized representative who has indicated his/her understanding and acceptance.     Dental advisory given  Plan Discussed with: CRNA  Anesthesia Plan Comments:         Anesthesia Quick Evaluation

## 2020-12-19 NOTE — Progress Notes (Signed)
Discussed this patient with DSS guardian and guardian has submitted MOST form with election for DNR and comfort measures. MOST form printed and placed in the chart. Code status changed to DNR and end-of-life order set initiation with order for compassionate extubation.   Critical care time: 49mn  AJesusita Oka MD [facility] and [facility]

## 2020-12-19 NOTE — ED Notes (Addendum)
Attempted to contact [name] for more information about patient.

## 2020-12-19 NOTE — TOC CAGE-AID Note (Signed)
Transition of Care [facility]) - CAGE-AID Screening   Patient Details  Name: Alyssa [name] MRN: [id number] Date of Birth: April 03, 1941   Clinical Narrative:  Patient intubated, unable to participate in screening.  CAGE-AID Screening: Substance Abuse Screening unable to be completed due to: : Patient unable to participate (patient intubated, unable to participate)

## 2020-12-19 NOTE — Ethics Note (Signed)
Ethics Consult Note  Initial contact w/ medical team 12/11/2020, which was on patient's hospital day 1   Source of Consult: Dr. Reather Laurence  Current attending physician/service: Md, Trauma, MD  Reason(s) for consult and ethical question(s): Discussion of medical futility Appropriate surrogate decision maker    Information-gathering: Chart review  Attempted to reach Dr Bobbye Morton - she has my cell phon enumber   Narrative:  Medical facts: Patient, Alyssa [name], is a 80 yo female who presents with significant head trauma due to fall at her nursing home. She suffered large right frontal intraparenchymal hemorrhage with extension of hemorrhage into the ventricular system, fracture of the right orbital floor, right globe hemorrhage. Patient has been evaluated by neurosurgery and they decline to pursue surgical intervention at this time: "Given her advanced premorbid issues such as Alzheimer's dementia, end stage renal disease requiring dialysis, CHF requiring group home assisted living and this injury likely greatly reducing what ever quality she did have, we are recommending nonaggressive comfort measures.  We do not believe that aggressive measures such as ventriculostomy or craniotomy will offer a desirable functional recovery."  Patient's Personal/Social Facts: Limited at this point. Patient apparently has an assigned legal guardian with DSS. No advanced directive or DNR/MOST forms available on file.    Ethics committee strives to ensure that all necessary and appropriate steps are taken, such that all decisions made for this patient are ethically justifiable. Ethics offers the following recommendations:       Recommendations:  1) regarding appropriate medical decision-making in patients who lack capacity to provide informed consent or refusal, the appropriate surrogate decision maker must be identified and they must demonstrate decision making in line with the patient's best interest.    Per record, this patient has a legal guardian under DSS. They should be contacted regarding medical recommendations, discussion of plan of care. If they have an appropriate understanding of risks/benefits and consent to medically recommended course of action, then no further steps are needed.   Medical team should ask the question: is her condition terminal in light of her other medical co-morbidities? If yes, then aggressive medical intervention is futile, therefore may withdraw aggressive treatment / offer comfort measures accordingly. If not, the surrogate decision maker ought to make decisions in line with what the patient would have wanted for herself (respecting patient autonomy), OR if this cannot be determined, the surrogate may act in their best judgment based on presumed values of the patient and the advise of medical experts (ethically acceptable paternalism).   information may be lacking regarding the patient's values on this matter  - would she have wanted aggressive treatment or not? Would medical intervention result in a QOL that meets an acceptable minimal standard for this patient? Such questions may be academic in this case given that the patient appears to have no family involved that can give insight to this and there is no advanced directive.    Lack of information on the patient's personal goals / values does NOT mean the medical team MUST pursue aggressive treatment simply on the assumption that's what the patient might have wanted. Best judgment between the medical team and DSS guardians is appropriate and ethically acceptable.    See: [place] 90. Medicine and Allied Occupations  90-21.13. Informed consent to health care treatment or procedure. In brief summary, this statute outlines that the following persons, in order indicated, are authorized to consent to medical treatment on behalf of the patient who  does not demonstrate capacity to do so:  Legally assigned guardian appointed by the court > healthcare agent appointed by legal healthcare power of attorney > healthcare agent appointed by the patient > legal spouse > majority of the patient's reasonably available parents and children who are at least 76 years of age > individual who has an established relationship with the patient, who is acting in good faith on behalf of the patient, and who can reliably convey the patient's wishes > attending physician with confirmation by another physician of the patient's condition and the necessity for treatment (unless in urgent/emergent situation)    2) If there is disagreement between the surrogate decision-maker and the medical team, the attending physician (in cooperation with at least one other physician consultant) is ethically justified in withdrawing or in not offering treatments IF those physicians are convinced that such interventions are futile, i.e. will not improve patient's QOL, will serve no medical benefit, or may in fact cause harm.    3) If the surrogate(s) insists upon treatments which the medical team considers futile, they have the right to attempt transfer to a facility that agrees to provide those treatments. (Of note, there is no designated time allowance frame for transfer, but it is advised to set clear expectations as much as possible. Family ought to be making demonstrable efforts at transfer if transfer is desired. It is reasonable for attending physicians to use their best judgment to determine deadlines for transfer, or deadlines for withdrawal of life support if transfer attempts are not successful. It is reasonable to keep patients on life support for a limited time to allow family to gather at bedside prior to withdrawal of life support.)   4) If needed, Ethics will strive to make 2+ committee members available for a meeting between Ethics representatives, the medical team and the patient's decision-makers. Unit manager will  be asked to arrange a time/place that is agreeable to all relevant parties. This meeting should limit its attendees to the attending physician, relevant consultants including members of the ethics committee, relevant nursing staff and other medical staff as appropriate, and patient's legal surrogate decision-maker(s) or other support parties who are authorized to receive HIPAA-protected information.        Thank you for this consult. Ethics will continue to follow this case.   Dr Bobbye Morton has my personal cell phone number and has my permission to share this number at their discretion.  Secure message on Epic is also welcome but may not receive an immediate response.  Please reference AMION for on-call committee member if needed.    [place]

## 2020-12-19 NOTE — Progress Notes (Signed)
OPHTHALMOLOGY CONSULT NOTE  Date:5/24/22Time: 8:40 AM  Patient Name: Alyssa [name]  DOB: 12/09/1940 MRN: [id number]  Reason for Consult: Right eye injurty HPI: This is a 80 y.o. female sp fall with intracranial hemorrhage and right eye injury    Social History   Occupational History  . Not on file  Tobacco Use  . Smoking status: Former Smoker    Packs/day: 0.25    Years: 10.00    Pack years: 2.50    Types: Cigarettes  . Smokeless tobacco: Former Systems developer    Types: Secondary school teacher  . Vaping Use: Never used  Substance and Sexual Activity  . Alcohol use: Not Currently    Alcohol/week: 1.0 standard drink    Types: 1 Cans of beer per week  . Drug use: No  . Sexual activity: Not Currently    No Known Allergies  ROS: Positive as above, otherwise negative.  EXAM:  Mental Status: Responsive to pain  Base Exam: Right Eye Left Eye  Visual Acuity (At near)  Reacts to light  IOP (Tonopen) 13 15  Pupillary Exam  Miotic with minimal reaction  Motility    Confrontation VF     Anterior Segment Exam    Lids/Lashes Edema and eccymosis WNL  Conjuctiva 360 hemorrhagic [place] with approx 3 mm conjuctival laceration superiorly that extends to bear sclera. The area was carefully explored with no signs of scleral laceration uveal tissue or lens.  White and Quiet  Cornea Clear Clear  Anterior Chamber Total hyphema Deep and Quiet  Iris  Miotic  Lens  Clear  Vitreous  WNL     Assessment and Recommendation: 1. Hyphema: IOP continues to be stable, Recommend continued atropine and pred forte as prescribed. Shield over eye at all times.   2. Conjunctival laceration: small expect spontaneous closure without intervention, continue gatifloxacin qid.   3. Neurosurgery recommending comfort care. Still would recommend against exploration of the globe given current medical condition  And no clear signs of rupture.   Please call with any questions.  Jola Schmidt MD [facility]  Ophthalmology [phone]

## 2020-12-19 NOTE — Progress Notes (Signed)
Patient transported from ED RESUS to [id number] with no complications.

## 2020-12-19 NOTE — ED Notes (Addendum)
This RN contacted [place] and spoke with Elaina Hoops, on-call DSS. Kizzie Bane is patient's case worker and should be contacted with any emergent needs.

## 2020-12-19 NOTE — Consult Note (Signed)
Consultation Note Date: 11/30/2020   Patient Name: Alyssa [name]  DOB: 10/02/1940  MRN: [id number]  Age / Sex: 80 y.o., female  PCP: [facility] Referring Physician: Md, Trauma, MD  Reason for Consultation: Establishing goals of care and Psychosocial/spiritual support  HPI/Patient Profile: 80 y.o. female  admitted on 12/02/2020 with past medical history significant for CHF, ESRD, and dementia who lives in a group home apparently fell face forward onto a desk. She was initially at [facility] emergency department where she decompensated requiring intubation.  She was subsequently transferred to [facility] for further care of large right frontal intraparenchymal hemorrhage, small subdural hemorrhage, depressed fracture of the right orbital floor, right globe rupture/hemorrhage.  Ophthalmology feels exploration in the OR not recommended due to serious medical comorbidities, indicated medical therapy.  Neurosurgery recommending nonaggressive comfort measures due to significant comorbidities and little likelihood that aggressive treatment would result in meaningful recovery.  Palliative care was consulted to establish goals of care, psychosocial support, coordination with state appointed guardian.  Clinical Assessment and Goals of Care:  This NP Wadie Lessen and Walden Field NP reviewed medical records, received report from team, assessed the patient and then spoke to the patient's legal guardians (Kailee Morrow-Jennings and Mechele Collin) via telephone to discuss diagnosis, prognosis, [place], EOL wishes disposition and options.  Guardian and department supervisor have requested email from the attending (Dr. Bobbye Morton with Trauma) with a summary of all medical recommendations. Once they have this information they will reach out to the patient's granddaughter for her opinion prior to making final decisions.   A   discussion was had today regarding advanced directives.  Her guardian feels that she may have a DNR, although one is not in their electronic record.  They are going to check with the facility for any existing paper copies. A discussion on the difference between a aggressive medical intervention path and a palliative comfort care path for this patient at this time was had.     They are familiar with the MOST form, which will be addressed on follow-up call.   Natural trajectory and expectations in her current state were discussed.  Questions and concerns addressed. Guardians were encouraged to call with questions or concerns.     PMT will continue to support holistically.   LEGAL GUARDIAN    SUMMARY OF RECOMMENDATIONS    Code Status/Advance Care Planning:  Full code  Encouraged guardian to consider DNR/DNI status understanding evidenced based poor outcomes in similar hospitalized patient, as the cause of arrest is likely associated with advanced chronic illness rather than an easily reversible acute cardio-pulmonary event.  Palliative Prophylaxis:   Frequent Pain Assessment and Turn Reposition  Additional Recommendations (Limitations, Scope, Preferences):  Full Scope Treatment  Psycho-social/Spiritual:   Desire for further Chaplaincy support: Patient not able to discuss  Additional Recommendations: Further discussion and engagement with legal guardians  Prognosis:   Long term poor prognosis. Short term pending wishes for life-prolonging measures  Discharge Planning: To Be Determined      Primary Diagnoses: Present on Admission: **None**   I have reviewed the medical record, interviewed the patient and family, and examined the patient. The following aspects are pertinent.  Past Medical History:  Diagnosis Date  . Chronic systolic CHF (congestive heart failure) ([place])   . CKD (chronic kidney disease), stage III ([place])   . Dementia ([place])   . Essential hypertension   .  Hyperlipidemia   . Hypertensive Non-ischemic Cardiomyopathy    a. 2010 Echo:  EF 55%;  b. 2010 borderline MV w/ apical thinning; c. 2010 Cath: LCX 50p->Med Rx;  d. 09/2014 Echo: EF 35-40% w/ Sev LVH and glob HK;  e. 09/2014 MV: EF 30-44%, small fixed apical defect (breast attenuation), no ischemia.  . Hypokalemia   . Type II diabetes mellitus ([place])    Social History   Socioeconomic History  . Marital status: Widowed    Spouse name: Not on file  . Number of children: Not on file  . Years of education: Not on file  . Highest education level: Not on file  Occupational History  . Not on file  Tobacco Use  . Smoking status: Former Smoker    Packs/day: 0.25    Years: 10.00    Pack years: 2.50    Types: Cigarettes  . Smokeless tobacco: Former Systems developer    Types: Secondary school teacher  . Vaping Use: Never used  Substance and Sexual Activity  . Alcohol use: Not Currently    Alcohol/week: 1.0 standard drink    Types: 1 Cans of beer per week  . Drug use: No  . Sexual activity: Not Currently  Other Topics Concern  . Not on file  Social History Narrative   Lives at [facility] assisted [facility]   Social Determinants of Health   Financial Resource Strain: Not on file  Food Insecurity: Not on file  Transportation Needs: Not on file  Physical Activity: Not on file  Stress: Not on file  Social Connections: Not on file   Family History  Problem Relation Age of Onset  . Diabetes Mother   . Hypertension Mother   . Diabetes Father   . Hypertension Father   . Breast cancer Neg Hx    Scheduled Meds: . atropine  1 drop Right Eye BID  . chlorhexidine gluconate (MEDLINE KIT)  15 mL Mouth Rinse BID  . Chlorhexidine Gluconate Cloth  6 each Topical Daily  . docusate  100 mg Per Tube BID  . gatifloxacin  1 drop Right Eye QID  . insulin aspart  0-15 Units Subcutaneous Q4H  . mouth rinse  15 mL Mouth Rinse 10 times per day  . polyethylene glycol  17 g Per Tube Daily  . potassium chloride  40 mEq Per  Tube Q4H  . prednisoLONE acetate  1 drop Right Eye QID   Continuous Infusions: . sodium chloride Stopped (11/27/2020 0742)  . fentaNYL infusion INTRAVENOUS 25 mcg/hr (12/10/2020 0900)  . propofol (DIPRIVAN) infusion Stopped (12/01/2020 0729)   PRN Meds:.acetaminophen, fentaNYL, hydrALAZINE, metoprolol tartrate, morphine injection Medications Prior to Admission:  Prior to Admission medications   Medication Sig Start Date End Date Taking? Authorizing Provider  amLODipine (NORVASC) 10 MG tablet Take 1 tablet (10 mg total) by mouth daily. (0700) 08/29/20   Minna Merritts, MD  aspirin EC 81 MG tablet Take 81 mg by mouth daily. (0700)    [provider]  Blood Glucose Monitoring Suppl (TRUETRACK BLOOD GLUCOSE) w/Device KIT 1 each by Does not apply route daily. 12/06/15   Glean Hess, MD  Calcium Carb-Cholecalciferol (CALCIUM 600+D3) 600-800 MG-UNIT TABS Take 1 tablet by mouth daily. (0700)    [provider]  cloNIDine (CATAPRES) 0.1 MG tablet Take 0.1 mg by mouth daily as needed (SBP freater than or equal to 180).    [provider]  cloNIDine (CATAPRES) 0.2 MG tablet Take 1 tablet (0.2 mg total) by mouth 3 (three) times daily. 08/29/20   Minna Merritts, MD  donepezil (ARICEPT) 10 MG tablet Take 10 mg by mouth at bedtime. (2000)    [provider]  ferrous sulfate 325 (65 FE) MG tablet Take 325 mg by mouth daily with breakfast. (0700)    [provider]  glipiZIDE (GLUCOTROL) 5 MG tablet Take 5 mg by mouth daily. (0700)    [provider]  GLOBAL INSULIN SYRINGES 30G X 5/16" 0.3 ML MISC USE ONCE A DAY FOR INSULIN 12/06/17   Glean Hess, MD  glucose blood (RELION PRIME TEST) test strip Test twice a day 08/18/17   Glean Hess, MD  hydrALAZINE (APRESOLINE) 100 MG tablet Take 100 mg by mouth 3 (three) times daily. 10/23/20   [provider]  HYDROcodone-acetaminophen (NORCO) 5-325 MG tablet Take 1-2 tablets by mouth every 6 (six)  hours as needed. Patient taking differently: Take 1-2 tablets by mouth every 6 (six) hours as needed (pain). 08/04/20   Schnier, Dolores Lory, MD  isosorbide mononitrate (IMDUR) 60 MG 24 hr tablet Take 60 mg by mouth in the morning. (0700) 10/23/20   [provider]  LORazepam (ATIVAN) 0.5 MG tablet Take 0.25 mg by mouth 2 (two) times daily.    [provider]  losartan (COZAAR) 100 MG tablet Take 1 tablet (100 mg total) by mouth daily. 08/29/20   Minna Merritts, MD  Melatonin 5 MG CAPS Take 2 capsules (10 mg total) by mouth at bedtime. Patient taking differently: Take 5 mg by mouth at bedtime. (2000) 06/29/18   Glean Hess, MD  metoprolol succinate (TOPROL-XL) 100 MG 24 hr tablet Take 1 tablet (100 mg total) by mouth daily. Patient taking differently: Take 100 mg by mouth daily. (0700) 08/29/20   Minna Merritts, MD  mirtazapine (REMERON) 15 MG tablet Take 1 tablet (15 mg total) by mouth at bedtime. 02/16/18   Glean Hess, MD  omeprazole (PRILOSEC) 40 MG capsule TAKE ONE CAPSULE TWICE A DAY Patient taking differently: Take 40 mg by mouth in the morning and at bedtime. 01/24/18   Glean Hess, MD  polyethylene glycol powder (GLYCOLAX/MIRALAX) powder Take 17 g by mouth 3 (three) times a week. 06/29/18   Glean Hess, MD  simvastatin (ZOCOR) 40 MG tablet Take 1 tablet (40 mg total) by mouth at bedtime. (2000) 08/29/20   Gollan, Kathlene November, MD   No Known Allergies Review of Systems  Unable to perform ROS: Intubated    Physical Exam Vitals and nursing note reviewed.  Constitutional:      General: She is not in acute distress.    Interventions: She is sedated and intubated.  Cardiovascular:     Rate and Rhythm: Normal rate and regular rhythm.     Comments: Right AV fistula +bruit/thrill Pulmonary:     Effort: She is intubated.  Abdominal:     General: Abdomen is flat. Bowel sounds are normal. There is no distension.     Palpations: Abdomen is soft.   Musculoskeletal:     Right lower leg: No edema.     Left lower leg: No edema.     Vital Signs: BP (!) 198/60   Pulse 76   Temp 100.22 F (37.9 C)   Resp 16   Ht '5\' 4"'  (1.626 m)   Wt 51.1 kg   SpO2 100%   BMI 19.34 kg/m  Pain Scale: CPOT       SpO2: SpO2: 100 % O2 Device:SpO2: 100 % O2 Flow Rate: .   IO: Intake/output summary:  Intake/Output Summary (Last 24 hours) at 11/29/2020 1024 Last data filed at 12/16/2020 0900 Gross per 24 hour  Intake 286.35 ml  Output 250 ml  Net 36.35 ml    LBM: Last BM Date:  (PTA) Baseline Weight: Weight: 51.1 kg Most recent weight: Weight: 51.1 kg     Palliative Assessment/Data: 10%   Discussed with Attending team  Time In: 1200 Time Out: 1310 Time Total: 70 minutes Greater than 50%  of this time was spent counseling and coordinating care related to the above assessment and plan.  Signed by: Wadie Lessen, NP   Please contact Palliative Medicine Team phone at [phone] for questions and concerns.  For individual provider: See Shea Evans

## 2020-12-20 LAB — SICKLE CELL SCREEN: Sickle Cell Screen: NEGATIVE

## 2020-12-20 NOTE — Progress Notes (Signed)
[facility] Progress Note     Subjective: CC:  Resting comfortably in bed. Extubated. All leads/monitors have been removed or turned off.  Objective: Vital signs in last 24 hours: Temp:  [99.32 F (37.4 C)-100.58 F (38.1 C)] 99.32 F (37.4 C) (05/24 1800) Pulse Rate:  [56-112] 112 (05/25 0700) Resp:  [6-21] 20 (05/25 0700) BP: (108-219)/(50-96) 177/66 (05/25 0103) SpO2:  [78 %-100 %] 80 % (05/25 0700) FiO2 (%):  [40 %] 40 % (05/24 1501) Last BM Date:  (PTA)  Intake/Output from previous day: 05/24 0701 - 05/25 0700 In: 306.4 [I.V.:256.4; IV Piggyback:50] Out: 200 [Urine:150; Emesis/NG output:50] Intake/Output this shift: No intake/output data recorded.  PE: Gen:  Resting comfortably in bed, eyes closed, no distress Card:  Regular rate and rhythm Pulm:  Normal effort ORA Abd: Soft, non-tender, non-distended Skin: warm and dry, no rashes  Neuro: not FC  Lab Results:  Recent Labs    12/02/2020 1812 12/14/2020 2300 12/26/2020 0146  WBC 5.5  --  7.9  HGB 8.4* 8.2* 8.8*  HCT 25.5* 24.0* 27.5*  PLT 155  --  161   BMET Recent Labs    12/03/2020 1812 12/10/2020 2300 12/16/2020 0146 12/13/2020 1551  NA 140 140 139  --   K 3.5 3.1* 2.8* 6.1*  CL 102  --  105  --   CO2 31  --  26  --   GLUCOSE 77  --  100*  --   BUN 8  --  7*  --   CREATININE 2.53*  --  2.70*  --   CALCIUM 8.3*  --  7.4*  --    PT/INR Recent Labs    12/26/2020 2021  LABPROT 12.7  INR 1.0   CMP     Component Value Date/Time   NA 139 11/26/2020 0146   NA 139 05/25/2018 1007   NA 138 10/28/2014 0105   K 6.1 (H) 12/25/2020 1551   K 3.7 10/28/2014 0105   CL 105 12/11/2020 0146   CL 106 10/28/2014 0105   CO2 26 12/02/2020 0146   CO2 26 10/28/2014 0105   GLUCOSE 100 (H) 12/10/2020 0146   GLUCOSE 212 (H) 10/28/2014 0105   BUN 7 (L) 12/17/2020 0146   BUN 17 05/25/2018 1007   BUN 15 10/28/2014 0105   CREATININE 2.70 (H) 12/10/2020 0146   CREATININE 1.12 (H) 10/28/2014 0105   CALCIUM 7.4  (L) 12/08/2020 0146   CALCIUM 8.3 (L) 10/28/2014 0105   PROT 6.7 05/25/2018 1007   PROT 6.4 (L) 10/27/2014 1623   ALBUMIN 3.8 05/25/2018 1007   ALBUMIN 3.1 (L) 10/27/2014 1623   AST 23 05/25/2018 1007   AST 30 10/27/2014 1623   ALT 9 05/25/2018 1007   ALT 19 10/27/2014 1623   ALKPHOS 63 05/25/2018 1007   ALKPHOS 69 10/27/2014 1623   BILITOT 0.2 05/25/2018 1007   BILITOT 0.7 10/27/2014 1623   GFRNONAA 18 (L) 12/20/2020 0146   GFRNONAA 49 (L) 10/28/2014 0105   GFRAA 31 (L) 05/25/2018 1007   GFRAA 57 (L) 10/28/2014 0105   Lipase  No results found for: LIPASE     Studies/Results: CT Head Wo Contrast  Result Date: 12/26/2020 CLINICAL DATA:  80 year old female with facial trauma. EXAM: CT HEAD WITHOUT CONTRAST CT MAXILLOFACIAL WITHOUT CONTRAST CT CERVICAL SPINE WITHOUT CONTRAST TECHNIQUE: Multidetector CT imaging of the head, cervical spine, and maxillofacial structures were performed using the standard protocol without intravenous contrast. Multiplanar CT image reconstructions of the cervical spine  and maxillofacial structures were also generated. COMPARISON:  Head CT dated 01/25/2017. FINDINGS: CT HEAD FINDINGS Brain: There is a large right frontal intraparenchymal hemorrhage measuring 2.2 x 6.0 cm in greatest axial dimension. There is extension of intraparenchymal hemorrhage into the ventricular system extending from the lateral ventricles into the third and fourth ventricles. There is mild dilatation of the ventricular system. Probable small subdural hemorrhage along the right anterior falx. There is moderate age-related atrophy and chronic microvascular ischemic changes. No significant midline shift. Vascular: No hyperdense vessel or unexpected calcification. Skull: Normal. Negative for fracture or focal lesion. Other: Right periorbital hematoma. CT MAXILLOFACIAL FINDINGS Osseous: There is a minimally depressed fracture of the right orbital floor. There is diastasis of the right  frontozygomatic suture. Old fracture of the left lamina Propecia. Orbits: There is slight irregularity of the right globe. High attenuating content within the right globe consistent with hemorrhage. The right lens has been displaced and not visualized. There is mild right exophthalmos. The retro-orbital fat are preserved. Sinuses: There is facet a shin of several ethmoid air cells. The maxillary sinuses and mastoid air cells are clear. Soft tissues: Right periorbital hematoma with several pockets of soft tissue air as well as small right orbital emphysema. There is laceration of the soft tissues of the lateral aspect of the right face and right periorbital region. Ophthalmology consult is advised. CT CERVICAL SPINE FINDINGS Alignment: No acute subluxation. There is reversal of normal cervical lordosis which may be positional or due to muscle spasm Skull base and vertebrae: No acute fracture. Osteopenia Soft tissues and spinal canal: No prevertebral fluid or swelling. No visible canal hematoma. Disc levels:  Multilevel degenerative changes. Upper chest: Negative. Other: There is a 12 mm left thyroid hypodense nodule. Bilateral carotid bulb calcified plaques. IMPRESSION: 1. Large right frontal intraparenchymal hemorrhage with extension of hemorrhage into the ventricular system. Mild dilatation of the ventricular system. No significant midline shift. Probable small right anterior parafalcine subdural hemorrhage. 2. No acute/traumatic cervical spine pathology. 3. Minimally depressed fracture of the right orbital floor and diastasis of the right frontozygomatic suture. 4. Right globe hemorrhage with slight irregularity of the right globe. The right lens has been displaced and not visualized. Ophthalmology consult is advised. 5. Right periorbital hematoma with several pockets of soft tissue air as well as small right orbital emphysema. These results were called by telephone at the time of interpretation on 12/12/2020 at  7:49 pm to provider [facility] , who verbally acknowledged these results. Electronically Signed   By: Anner Crete M.D.   On: 12/20/2020 20:00   CT Cervical Spine Wo Contrast  Result Date: 12/06/2020 CLINICAL DATA:  80 year old female with facial trauma. EXAM: CT HEAD WITHOUT CONTRAST CT MAXILLOFACIAL WITHOUT CONTRAST CT CERVICAL SPINE WITHOUT CONTRAST TECHNIQUE: Multidetector CT imaging of the head, cervical spine, and maxillofacial structures were performed using the standard protocol without intravenous contrast. Multiplanar CT image reconstructions of the cervical spine and maxillofacial structures were also generated. COMPARISON:  Head CT dated 01/25/2017. FINDINGS: CT HEAD FINDINGS Brain: There is a large right frontal intraparenchymal hemorrhage measuring 2.2 x 6.0 cm in greatest axial dimension. There is extension of intraparenchymal hemorrhage into the ventricular system extending from the lateral ventricles into the third and fourth ventricles. There is mild dilatation of the ventricular system. Probable small subdural hemorrhage along the right anterior falx. There is moderate age-related atrophy and chronic microvascular ischemic changes. No significant midline shift. Vascular: No hyperdense vessel or unexpected calcification. Skull: Normal.  Negative for fracture or focal lesion. Other: Right periorbital hematoma. CT MAXILLOFACIAL FINDINGS Osseous: There is a minimally depressed fracture of the right orbital floor. There is diastasis of the right frontozygomatic suture. Old fracture of the left lamina Propecia. Orbits: There is slight irregularity of the right globe. High attenuating content within the right globe consistent with hemorrhage. The right lens has been displaced and not visualized. There is mild right exophthalmos. The retro-orbital fat are preserved. Sinuses: There is facet a shin of several ethmoid air cells. The maxillary sinuses and mastoid air cells are clear. Soft tissues:  Right periorbital hematoma with several pockets of soft tissue air as well as small right orbital emphysema. There is laceration of the soft tissues of the lateral aspect of the right face and right periorbital region. Ophthalmology consult is advised. CT CERVICAL SPINE FINDINGS Alignment: No acute subluxation. There is reversal of normal cervical lordosis which may be positional or due to muscle spasm Skull base and vertebrae: No acute fracture. Osteopenia Soft tissues and spinal canal: No prevertebral fluid or swelling. No visible canal hematoma. Disc levels:  Multilevel degenerative changes. Upper chest: Negative. Other: There is a 12 mm left thyroid hypodense nodule. Bilateral carotid bulb calcified plaques. IMPRESSION: 1. Large right frontal intraparenchymal hemorrhage with extension of hemorrhage into the ventricular system. Mild dilatation of the ventricular system. No significant midline shift. Probable small right anterior parafalcine subdural hemorrhage. 2. No acute/traumatic cervical spine pathology. 3. Minimally depressed fracture of the right orbital floor and diastasis of the right frontozygomatic suture. 4. Right globe hemorrhage with slight irregularity of the right globe. The right lens has been displaced and not visualized. Ophthalmology consult is advised. 5. Right periorbital hematoma with several pockets of soft tissue air as well as small right orbital emphysema. These results were called by telephone at the time of interpretation on 12/23/2020 at 7:49 pm to provider [facility] , who verbally acknowledged these results. Electronically Signed   By: Anner Crete M.D.   On: 12/23/2020 20:00   DG Chest Port 1 View  Result Date: 12/03/2020 CLINICAL DATA:  81 year old female status post intubation EXAM: PORTABLE CHEST 1 VIEW COMPARISON:  Chest radiograph dated 04/04/2020. FINDINGS: Endotracheal tube with tip at the thoracic inlet approximately 9 cm above the carina. The tube can be further  advanced by 2 cm for optimal positioning. Enteric tube with side-port at the level of the GE junction. Recommend further advancing by additional 6 cm. No focal consolidation, pleural effusion, or pneumothorax. Mild cardiomegaly. Atherosclerotic calcification of the aorta. No acute osseous pathology. IMPRESSION: 1. Endotracheal tube above the carina. 2. Enteric tube with side-port at the GE junction. Recommend further advancing by additional 6 cm. Electronically Signed   By: Anner Crete M.D.   On: 11/30/2020 20:55   CT Maxillofacial WO CM  Result Date: 12/09/2020 CLINICAL DATA:  80 year old female with facial trauma. EXAM: CT HEAD WITHOUT CONTRAST CT MAXILLOFACIAL WITHOUT CONTRAST CT CERVICAL SPINE WITHOUT CONTRAST TECHNIQUE: Multidetector CT imaging of the head, cervical spine, and maxillofacial structures were performed using the standard protocol without intravenous contrast. Multiplanar CT image reconstructions of the cervical spine and maxillofacial structures were also generated. COMPARISON:  Head CT dated 01/25/2017. FINDINGS: CT HEAD FINDINGS Brain: There is a large right frontal intraparenchymal hemorrhage measuring 2.2 x 6.0 cm in greatest axial dimension. There is extension of intraparenchymal hemorrhage into the ventricular system extending from the lateral ventricles into the third and fourth ventricles. There is mild dilatation of the  ventricular system. Probable small subdural hemorrhage along the right anterior falx. There is moderate age-related atrophy and chronic microvascular ischemic changes. No significant midline shift. Vascular: No hyperdense vessel or unexpected calcification. Skull: Normal. Negative for fracture or focal lesion. Other: Right periorbital hematoma. CT MAXILLOFACIAL FINDINGS Osseous: There is a minimally depressed fracture of the right orbital floor. There is diastasis of the right frontozygomatic suture. Old fracture of the left lamina Propecia. Orbits: There is  slight irregularity of the right globe. High attenuating content within the right globe consistent with hemorrhage. The right lens has been displaced and not visualized. There is mild right exophthalmos. The retro-orbital fat are preserved. Sinuses: There is facet a shin of several ethmoid air cells. The maxillary sinuses and mastoid air cells are clear. Soft tissues: Right periorbital hematoma with several pockets of soft tissue air as well as small right orbital emphysema. There is laceration of the soft tissues of the lateral aspect of the right face and right periorbital region. Ophthalmology consult is advised. CT CERVICAL SPINE FINDINGS Alignment: No acute subluxation. There is reversal of normal cervical lordosis which may be positional or due to muscle spasm Skull base and vertebrae: No acute fracture. Osteopenia Soft tissues and spinal canal: No prevertebral fluid or swelling. No visible canal hematoma. Disc levels:  Multilevel degenerative changes. Upper chest: Negative. Other: There is a 12 mm left thyroid hypodense nodule. Bilateral carotid bulb calcified plaques. IMPRESSION: 1. Large right frontal intraparenchymal hemorrhage with extension of hemorrhage into the ventricular system. Mild dilatation of the ventricular system. No significant midline shift. Probable small right anterior parafalcine subdural hemorrhage. 2. No acute/traumatic cervical spine pathology. 3. Minimally depressed fracture of the right orbital floor and diastasis of the right frontozygomatic suture. 4. Right globe hemorrhage with slight irregularity of the right globe. The right lens has been displaced and not visualized. Ophthalmology consult is advised. 5. Right periorbital hematoma with several pockets of soft tissue air as well as small right orbital emphysema. These results were called by telephone at the time of interpretation on 11/28/2020 at 7:49 pm to provider [facility] , who verbally acknowledged these results.  Electronically Signed   By: Anner Crete M.D.   On: 11/27/2020 20:00    Anti-infectives: Anti-infectives (From admission, onward)   None       Assessment/Plan GLF  Large R IPH and SDH with extension into ventricles - NSGY c/s, Dr. Ronnald Ramp, recommending against ventriculostomy, craniotomy and recommending comfort measures. Hyphema and lenticular dislocation of R eye with conjunctival laceration - ophtho c/s, Dr. Valetta Close, no definitive evidence for globe rupture or operative indication. Recommend vigamox QID pred forte 1% QID and atropine BID Hypertension - was previously on cardene gtt, now off R orbital floor fracture - ENT not consulted due to poor prognosis MMP/ESRD - Renal c/s deferred given poor prognosis and transition to comfort care FEN -NPO DVT - SCDs Dispo - DNR and comfort measures initiated over night. S/p extubation at 8:50 PM. Transfer to 6N for ongoing end-of-life care. appreciate palliative following.    LOS: 2 days    Obie Dredge, [facility] Please see Amion for pager number during day hours 7:00am-4:30pm

## 2020-12-20 NOTE — Progress Notes (Signed)
Pt transferred from [facility] on a fentanyl drip at 150mg/hr. No s/s of distress noted. Skin check did not show any skin skin breakdown, prophylactic sacral foam in place. Right eye has an eye patch and bleeding noted , this is not a change from how she was on the transferring unit.

## 2020-12-20 NOTE — Progress Notes (Signed)
NEUROSURGERY PROGRESS NOTE  Patient now comfort care. Will sign off at this time   Temp:  [99.32 F (37.4 C)-101.2 F (38.4 C)] 101.2 F (38.4 C) (05/25 0800) Pulse Rate:  [56-112] 101 (05/25 0900) Resp:  [6-21] 14 (05/25 0900) BP: (108-211)/(50-96) 177/66 (05/25 0103) SpO2:  [78 %-100 %] 91 % (05/25 0900) FiO2 (%):  [40 %] 40 % (05/24 1501)   Eleonore Chiquito, NP 12/20/2020 10:27 AM

## 2020-12-20 NOTE — TOC Initial Note (Signed)
Transition of Care [facility]) - Initial/Assessment Note    Patient Details  Name: Alyssa [name] MRN: [id number] Date of Birth: Feb 03, 1941  [facility] CM/SW Contact:    Ella Bodo, RN Phone Number: 12/20/2020, 1:45 PM  Clinical Narrative:  Pt admitted on 12/06/2020 after falling forward onto a desk. She sustained a large right frontal intraparenchymal hemorrhage, small SDH, depressed fracture of the right orbital floor, and right eye injury.  PTA, pt resided at a group home; she has a legal guardian through [place]. [place] Team was consulted to assist with goals of care.  Patient's DSS guardian has submitted MOST form with election for DNR and comfort measures. Pt has been extubated; plan transfer to 6N for ongoing end of life care.  Will follow.                     Barriers to Discharge: Continued Medical Work up        Expected Discharge Plan and Services     Discharge Planning Services: CM Consult   Living arrangements for the past 2 months: Group Home                                      Prior Living Arrangements/Services Living arrangements for the past 2 months: Group Home Lives with:: Facility Resident Patient language and need for interpreter reviewed:: Yes        Need for Family Participation in Patient Care: Yes (Comment) Care giver support system in place?: Yes (comment)   Criminal Activity/Legal Involvement Pertinent to Current Situation/Hospitalization: No - Comment as needed  Activities of Daily Living      Permission Sought/Granted                  Emotional Assessment Appearance:: Appears stated age Attitude/Demeanor/Rapport: Unable to Assess Affect (typically observed): Unable to Assess        Admission diagnosis:  Intraparenchymal hematoma of brain ([place]) [S06.360A] Traumatic hemorrhage of right cerebrum with loss of consciousness, initial encounter [facility]) [S06.349A] Patient Active Problem List    Diagnosis Date Noted  . Intraparenchymal hematoma of brain ([place]) 12/13/2020  . End stage renal disease ([place]) 06/29/2020  . Complication of vascular access for dialysis 06/29/2020  . Secondary hyperparathyroidism of renal origin ([place]) 02/08/2020  . Anemia in chronic kidney disease 12/02/2019  . Benign hypertensive kidney disease with chronic kidney disease 12/02/2019  . Hypocalcemia 12/02/2019  . Proteinuria 12/02/2019  . Insomnia 06/29/2018  . Constipation 06/29/2018  . Tobacco use disorder 05/25/2018  . Closed fracture of right distal humerus 04/15/2017  . History of falling 02/11/2017  . Hip pain, left 01/31/2017  . Cardiomyopathy due to hypertension, without heart failure ([place]) 09/23/2016  . Cardiac murmur 06/03/2016  . Hyperlipidemia associated with type 2 diabetes mellitus ([place])   . Alzheimer's dementia without behavioral disturbance ([place]) 03/07/2015  . Coronary artery disease involving native coronary artery of native heart without angina pectoris 12/02/2014  . Allergic rhinitis 11/15/2014  . Essential hypertension 11/15/2014  . Arthritis, degenerative 11/15/2014  . Hypokalemia 11/15/2014  . H/O gastrointestinal hemorrhage 03/28/2011  . Diabetes ([place]) 03/28/2011   PCP:  [facility]:   [place] [place] [address] Phone: [phone] Fax: [phone]     Social Determinants of Health ([place])  Interventions    Readmission Risk Interventions No flowsheet data found.  Reinaldo Raddle, RN, BSN  Trauma/Neuro ICU Case Manager [phone]

## 2020-12-27 NOTE — Progress Notes (Signed)
Went to patient room. Found patient not breathing with no carotid pulse and no heart rate on ausculation. Assessed left pupil was dilated. Unable to check right pupil due to injury. 2 nurses Honor Loh, RN and Britt Boozer, RN certified death at [id number]. Legal guardian Elaina Hoops notified [phone]) who provided the name of the funeral home to be [employer]. Legal guardian stated that she would inform the family. Charge nurse made aware. Trisha Mangle, PA notified via secure chat.

## 2020-12-27 NOTE — Progress Notes (Signed)
[facility] Progress Note     Subjective: CC:  Resting comfortably in bed. ophthalmology at the bedside to perform IOP check.   Objective: Vital signs in last 24 hours: Temp:  [98.9 F (37.2 C)-101.2 F (38.4 C)] 100.1 F (37.8 C) (05/25 2027) Pulse Rate:  [96-121] 114 (05/25 2027) Resp:  [5-17] 14 (05/25 2027) BP: (156-159)/(70-79) 159/79 (05/25 2027) SpO2:  [66 %-92 %] 66 % (05/25 2027) Last BM Date:  (PTA)  Intake/Output from previous day: 05/25 0701 - 05/26 0700 In: 79.1 [I.V.:79.1] Out: 50 [Urine:50] Intake/Output this shift: No intake/output data recorded.  PE: Gen:  Resting comfortably in bed, eyes closed, no distress, no responding to voice. HEENT: eye patch removed by ophtho MD. Some bleeding. Edematous.  Card:  Regular rate and rhythm Pulm:  Slightly labored, ORA Abd: Soft, non-tender, non-distended Skin: warm and dry, no rashes  Neuro: not FC  Lab Results:  Recent Labs    12/09/2020 1812 11/29/2020 2300 12/06/2020 0146  WBC 5.5  --  7.9  HGB 8.4* 8.2* 8.8*  HCT 25.5* 24.0* 27.5*  PLT 155  --  161   BMET Recent Labs    12/12/2020 1812 11/27/2020 2300 12/20/2020 0146 12/26/2020 1551  NA 140 140 139  --   K 3.5 3.1* 2.8* 6.1*  CL 102  --  105  --   CO2 31  --  26  --   GLUCOSE 77  --  100*  --   BUN 8  --  7*  --   CREATININE 2.53*  --  2.70*  --   CALCIUM 8.3*  --  7.4*  --    PT/INR Recent Labs    12/20/2020 2021  LABPROT 12.7  INR 1.0   CMP     Component Value Date/Time   NA 139 11/29/2020 0146   NA 139 05/25/2018 1007   NA 138 10/28/2014 0105   K 6.1 (H) 11/27/2020 1551   K 3.7 10/28/2014 0105   CL 105 12/26/2020 0146   CL 106 10/28/2014 0105   CO2 26 12/16/2020 0146   CO2 26 10/28/2014 0105   GLUCOSE 100 (H) 12/09/2020 0146   GLUCOSE 212 (H) 10/28/2014 0105   BUN 7 (L) 11/27/2020 0146   BUN 17 05/25/2018 1007   BUN 15 10/28/2014 0105   CREATININE 2.70 (H) 12/26/2020 0146   CREATININE 1.12 (H) 10/28/2014 0105   CALCIUM  7.4 (L) 12/16/2020 0146   CALCIUM 8.3 (L) 10/28/2014 0105   PROT 6.7 05/25/2018 1007   PROT 6.4 (L) 10/27/2014 1623   ALBUMIN 3.8 05/25/2018 1007   ALBUMIN 3.1 (L) 10/27/2014 1623   AST 23 05/25/2018 1007   AST 30 10/27/2014 1623   ALT 9 05/25/2018 1007   ALT 19 10/27/2014 1623   ALKPHOS 63 05/25/2018 1007   ALKPHOS 69 10/27/2014 1623   BILITOT 0.2 05/25/2018 1007   BILITOT 0.7 10/27/2014 1623   GFRNONAA 18 (L) 12/03/2020 0146   GFRNONAA 49 (L) 10/28/2014 0105   GFRAA 31 (L) 05/25/2018 1007   GFRAA 57 (L) 10/28/2014 0105   Lipase  No results found for: LIPASE     Studies/Results: No results found.  Anti-infectives: Anti-infectives (From admission, onward)   None       Assessment/Plan GLF  Large R IPH and SDH with extension into ventricles - NSGY c/s, Dr. Ronnald Ramp, recommending against ventriculostomy, craniotomy and recommending comfort measures. Hyphema and lenticular dislocation of R eye with conjunctival laceration - ophtho c/s, Dr.  Bowen, no definitive evidence for globe rupture or operative indication. Recommend vigamox QID pred forte 1% QID and atropine BID Hypertension - was previously on cardene gtt, now off R orbital floor fracture - ENT not consulted due to poor prognosis MMP/ESRD - Renal c/s deferred given poor prognosis and transition to comfort care FEN -NPO DVT - SCDs Dispo - comfort care. appreciate palliative following. Appreciate ophthalmology exam today, noted their plans to sign off.       LOS: 3 days    Obie Dredge, [facility] Please see Amion for pager number during day hours 7:00am-4:30pm

## 2020-12-27 NOTE — Death Summary Note (Signed)
DEATH SUMMARY   Patient Details  Name: Alyssa [name] MRN: [id number] DOB: November 10, 1940  Admission/Discharge Information   Admit Date:  2021/01/14  Date of Death:    Time of Death:    Length of Stay: 1  Referring Physician: [facility], [facility]   Reason(s) for Hospitalization  Fall  Diagnoses  Preliminary cause of death:   Secondary Diagnoses (including complications and co-morbidities):  Active Problems:   Intraparenchymal hematoma of brain [facility])   Brief Hospital Course (including significant findings, care, treatment, and services provided and events leading to death)  Alyssa [name] is a 80 y.o. year old female s/p GLF with extensive IPH. Transitioned to comfort care and expired.     Pertinent Labs and Studies  Significant Diagnostic Studies CT Head Wo Contrast  Result Date: January 14, 2021 CLINICAL DATA:  80 year old female with facial trauma. EXAM: CT HEAD WITHOUT CONTRAST CT MAXILLOFACIAL WITHOUT CONTRAST CT CERVICAL SPINE WITHOUT CONTRAST TECHNIQUE: Multidetector CT imaging of the head, cervical spine, and maxillofacial structures were performed using the standard protocol without intravenous contrast. Multiplanar CT image reconstructions of the cervical spine and maxillofacial structures were also generated. COMPARISON:  Head CT dated 01/25/2017. FINDINGS: CT HEAD FINDINGS Brain: There is a large right frontal intraparenchymal hemorrhage measuring 2.2 x 6.0 cm in greatest axial dimension. There is extension of intraparenchymal hemorrhage into the ventricular system extending from the lateral ventricles into the third and fourth ventricles. There is mild dilatation of the ventricular system. Probable small subdural hemorrhage along the right anterior falx. There is moderate age-related atrophy and chronic microvascular ischemic changes. No significant midline shift. Vascular: No hyperdense vessel or unexpected calcification. Skull: Normal. Negative for fracture or focal  lesion. Other: Right periorbital hematoma. CT MAXILLOFACIAL FINDINGS Osseous: There is a minimally depressed fracture of the right orbital floor. There is diastasis of the right frontozygomatic suture. Old fracture of the left lamina Propecia. Orbits: There is slight irregularity of the right globe. High attenuating content within the right globe consistent with hemorrhage. The right lens has been displaced and not visualized. There is mild right exophthalmos. The retro-orbital fat are preserved. Sinuses: There is facet a shin of several ethmoid air cells. The maxillary sinuses and mastoid air cells are clear. Soft tissues: Right periorbital hematoma with several pockets of soft tissue air as well as small right orbital emphysema. There is laceration of the soft tissues of the lateral aspect of the right face and right periorbital region. Ophthalmology consult is advised. CT CERVICAL SPINE FINDINGS Alignment: No acute subluxation. There is reversal of normal cervical lordosis which may be positional or due to muscle spasm Skull base and vertebrae: No acute fracture. Osteopenia Soft tissues and spinal canal: No prevertebral fluid or swelling. No visible canal hematoma. Disc levels:  Multilevel degenerative changes. Upper chest: Negative. Other: There is a 12 mm left thyroid hypodense nodule. Bilateral carotid bulb calcified plaques. IMPRESSION: 1. Large right frontal intraparenchymal hemorrhage with extension of hemorrhage into the ventricular system. Mild dilatation of the ventricular system. No significant midline shift. Probable small right anterior parafalcine subdural hemorrhage. 2. No acute/traumatic cervical spine pathology. 3. Minimally depressed fracture of the right orbital floor and diastasis of the right frontozygomatic suture. 4. Right globe hemorrhage with slight irregularity of the right globe. The right lens has been displaced and not visualized. Ophthalmology consult is advised. 5. Right periorbital  hematoma with several pockets of soft tissue air as well as small right orbital emphysema. These results were called by telephone at  the time of interpretation on 12/05/2020 at 7:49 pm to provider [facility] , who verbally acknowledged these results. Electronically Signed   By: Anner Crete M.D.   On: 12/08/2020 20:00   CT Cervical Spine Wo Contrast  Result Date: 12/06/2020 CLINICAL DATA:  80 year old female with facial trauma. EXAM: CT HEAD WITHOUT CONTRAST CT MAXILLOFACIAL WITHOUT CONTRAST CT CERVICAL SPINE WITHOUT CONTRAST TECHNIQUE: Multidetector CT imaging of the head, cervical spine, and maxillofacial structures were performed using the standard protocol without intravenous contrast. Multiplanar CT image reconstructions of the cervical spine and maxillofacial structures were also generated. COMPARISON:  Head CT dated 01/25/2017. FINDINGS: CT HEAD FINDINGS Brain: There is a large right frontal intraparenchymal hemorrhage measuring 2.2 x 6.0 cm in greatest axial dimension. There is extension of intraparenchymal hemorrhage into the ventricular system extending from the lateral ventricles into the third and fourth ventricles. There is mild dilatation of the ventricular system. Probable small subdural hemorrhage along the right anterior falx. There is moderate age-related atrophy and chronic microvascular ischemic changes. No significant midline shift. Vascular: No hyperdense vessel or unexpected calcification. Skull: Normal. Negative for fracture or focal lesion. Other: Right periorbital hematoma. CT MAXILLOFACIAL FINDINGS Osseous: There is a minimally depressed fracture of the right orbital floor. There is diastasis of the right frontozygomatic suture. Old fracture of the left lamina Propecia. Orbits: There is slight irregularity of the right globe. High attenuating content within the right globe consistent with hemorrhage. The right lens has been displaced and not visualized. There is mild right  exophthalmos. The retro-orbital fat are preserved. Sinuses: There is facet a shin of several ethmoid air cells. The maxillary sinuses and mastoid air cells are clear. Soft tissues: Right periorbital hematoma with several pockets of soft tissue air as well as small right orbital emphysema. There is laceration of the soft tissues of the lateral aspect of the right face and right periorbital region. Ophthalmology consult is advised. CT CERVICAL SPINE FINDINGS Alignment: No acute subluxation. There is reversal of normal cervical lordosis which may be positional or due to muscle spasm Skull base and vertebrae: No acute fracture. Osteopenia Soft tissues and spinal canal: No prevertebral fluid or swelling. No visible canal hematoma. Disc levels:  Multilevel degenerative changes. Upper chest: Negative. Other: There is a 12 mm left thyroid hypodense nodule. Bilateral carotid bulb calcified plaques. IMPRESSION: 1. Large right frontal intraparenchymal hemorrhage with extension of hemorrhage into the ventricular system. Mild dilatation of the ventricular system. No significant midline shift. Probable small right anterior parafalcine subdural hemorrhage. 2. No acute/traumatic cervical spine pathology. 3. Minimally depressed fracture of the right orbital floor and diastasis of the right frontozygomatic suture. 4. Right globe hemorrhage with slight irregularity of the right globe. The right lens has been displaced and not visualized. Ophthalmology consult is advised. 5. Right periorbital hematoma with several pockets of soft tissue air as well as small right orbital emphysema. These results were called by telephone at the time of interpretation on 12/07/2020 at 7:49 pm to provider [facility] , who verbally acknowledged these results. Electronically Signed   By: Anner Crete M.D.   On: 12/22/2020 20:00   PERIPHERAL VASCULAR CATHETERIZATION  Result Date: 12/12/2020 See Op Note  DG Chest Port 1 View  Result Date:  12/17/2020 CLINICAL DATA:  80 year old female status post intubation EXAM: PORTABLE CHEST 1 VIEW COMPARISON:  Chest radiograph dated 04/04/2020. FINDINGS: Endotracheal tube with tip at the thoracic inlet approximately 9 cm above the carina. The tube can be further advanced  by 2 cm for optimal positioning. Enteric tube with side-port at the level of the GE junction. Recommend further advancing by additional 6 cm. No focal consolidation, pleural effusion, or pneumothorax. Mild cardiomegaly. Atherosclerotic calcification of the aorta. No acute osseous pathology. IMPRESSION: 1. Endotracheal tube above the carina. 2. Enteric tube with side-port at the GE junction. Recommend further advancing by additional 6 cm. Electronically Signed   By: Anner Crete M.D.   On: 12/12/2020 20:55   VAS US DUPLEX DIALYSIS ACCESS (AVF,AVG)  Result Date: 12/07/2020 DIALYSIS ACCESS Patient Name:  MAKAI CAPPA [facility]  Date of Exam:   12/07/2020 Medical Rec #: [id number]       Accession #:    [id number] Date of Birth: 07/11/1941      Patient Gender: F Patient Age:   61Y Exam Location:  [facility] Procedure:      VAS US DUPLEX DIALYSIS ACCESS (AVF, AVG) Referring Phys: [id number] [place] --------------------------------------------------------------------------------  Reason for Exam: Routine follow up. Access Site: Left Upper Extremity. Access Type: Brachial-cephalic AVF. History: 08/04/2020: Left Brachial Cephalic A/V Fistula Creation. Comparison Study: 08/29/20 Performing Technologist: Concha Norway RVT  Examination Guidelines: A complete evaluation includes B-mode imaging, spectral Doppler, color Doppler, and power Doppler as needed of all accessible portions of each vessel. Unilateral testing is considered an integral part of a complete examination. Limited examinations for reoccurring indications may be performed as noted.  Findings: +--------------------+----------+-----------------+--------+ AVF                 PSV  (cm/s)Flow Vol (mL/min)Comments +--------------------+----------+-----------------+--------+ Native artery inflow   177          1204                +--------------------+----------+-----------------+--------+ AVF Anastomosis        238                              +--------------------+----------+-----------------+--------+  +------------+----------+-------------+----------+----------+ OUTFLOW VEINPSV (cm/s)Diameter (cm)Depth (cm) Describe  +------------+----------+-------------+----------+----------+ Shoulder       259                                      +------------+----------+-------------+----------+----------+ Prox UA        235                                      +------------+----------+-------------+----------+----------+ Mid UA         159                           aneurysmal +------------+----------+-------------+----------+----------+ Dist UA        140        1.33                          +------------+----------+-------------+----------+----------+  +---------------+------------+----------+---------+--------+------------------+                  Diameter  Depth (cm)Branching  PSV      Flow Volume                        (cm)                        (  cm/s)      (ml/min)      +---------------+------------+----------+---------+--------+------------------+ Left Rad Art                                     32                      Dis                                                                      +---------------+------------+----------+---------+--------+------------------+ Antegrade                                                                +---------------+------------+----------+---------+--------+------------------+  Summary: 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 Patient had a history of a bleed in this area x 2 weeks.  *See table(s) above for measurements and observations.  Diagnosing  physician: [name] MD Electronically signed by [name] MD on 12/07/2020 at 4:51:04 PM.   --------------------------------------------------------------------------------   Final    CT Maxillofacial WO CM  Result Date: 12/16/2020 CLINICAL DATA:  80 year old female with facial trauma. EXAM: CT HEAD WITHOUT CONTRAST CT MAXILLOFACIAL WITHOUT CONTRAST CT CERVICAL SPINE WITHOUT CONTRAST TECHNIQUE: Multidetector CT imaging of the head, cervical spine, and maxillofacial structures were performed using the standard protocol without intravenous contrast. Multiplanar CT image reconstructions of the cervical spine and maxillofacial structures were also generated. COMPARISON:  Head CT dated 01/25/2017. FINDINGS: CT HEAD FINDINGS Brain: There is a large right frontal intraparenchymal hemorrhage measuring 2.2 x 6.0 cm in greatest axial dimension. There is extension of intraparenchymal hemorrhage into the ventricular system extending from the lateral ventricles into the third and fourth ventricles. There is mild dilatation of the ventricular system. Probable small subdural hemorrhage along the right anterior falx. There is moderate age-related atrophy and chronic microvascular ischemic changes. No significant midline shift. Vascular: No hyperdense vessel or unexpected calcification. Skull: Normal. Negative for fracture or focal lesion. Other: Right periorbital hematoma. CT MAXILLOFACIAL FINDINGS Osseous: There is a minimally depressed fracture of the right orbital floor. There is diastasis of the right frontozygomatic suture. Old fracture of the left lamina Propecia. Orbits: There is slight irregularity of the right globe. High attenuating content within the right globe consistent with hemorrhage. The right lens has been displaced and not visualized. There is mild right exophthalmos. The retro-orbital fat are preserved. Sinuses: There is facet a shin of several ethmoid air cells. The maxillary sinuses and mastoid  air cells are clear. Soft tissues: Right periorbital hematoma with several pockets of soft tissue air as well as small right orbital emphysema. There is laceration of the soft tissues of the lateral aspect of the right face and right periorbital region. Ophthalmology consult is advised. CT CERVICAL SPINE FINDINGS Alignment: No acute subluxation. There is reversal of normal cervical lordosis which may be positional or due to muscle spasm Skull base and vertebrae: No acute fracture. Osteopenia Soft tissues  and spinal canal: No prevertebral fluid or swelling. No visible canal hematoma. Disc levels:  Multilevel degenerative changes. Upper chest: Negative. Other: There is a 12 mm left thyroid hypodense nodule. Bilateral carotid bulb calcified plaques. IMPRESSION: 1. Large right frontal intraparenchymal hemorrhage with extension of hemorrhage into the ventricular system. Mild dilatation of the ventricular system. No significant midline shift. Probable small right anterior parafalcine subdural hemorrhage. 2. No acute/traumatic cervical spine pathology. 3. Minimally depressed fracture of the right orbital floor and diastasis of the right frontozygomatic suture. 4. Right globe hemorrhage with slight irregularity of the right globe. The right lens has been displaced and not visualized. Ophthalmology consult is advised. 5. Right periorbital hematoma with several pockets of soft tissue air as well as small right orbital emphysema. These results were called by telephone at the time of interpretation on 12/10/2020 at 7:49 pm to provider [facility] , who verbally acknowledged these results. Electronically Signed   By: Anner Crete M.D.   On: 12/07/2020 20:00    Microbiology Recent Results (from the past 240 hour(s))  Resp Panel by RT-PCR (Flu A&B, Covid) Nasopharyngeal Swab     Status: None   Collection Time: 12/24/2020  8:20 PM   Specimen: Nasopharyngeal Swab; Nasopharyngeal(NP) swabs in vial transport medium  Result  Value Ref Range Status   SARS Coronavirus 2 by RT PCR NEGATIVE NEGATIVE Final    Comment: (NOTE) SARS-CoV-2 target nucleic acids are NOT DETECTED.  The SARS-CoV-2 RNA is generally detectable in upper respiratory specimens during the acute phase of infection. The lowest concentration of SARS-CoV-2 viral copies this assay can detect is 138 copies/mL. A negative result does not preclude SARS-Cov-2 infection and should not be used as the sole basis for treatment or other patient management decisions. A negative result may occur with  improper specimen collection/handling, submission of specimen other than nasopharyngeal swab, presence of viral mutation(s) within the areas targeted by this assay, and inadequate number of viral copies(<138 copies/mL). A negative result must be combined with clinical observations, patient history, and epidemiological information. The expected result is Negative.  Fact Sheet for Patients:  EntrepreneurPulse.com.au  Fact Sheet for Healthcare Providers:  IncredibleEmployment.be  This test is no t yet approved or cleared by the [place] FDA and  has been authorized for detection and/or diagnosis of SARS-CoV-2 by FDA under an Emergency Use Authorization (EUA). This EUA will remain  in effect (meaning this test can be used) for the duration of the COVID-19 declaration under Section 564(b)(1) of the Act, 21 U.S.C.section 360bbb-3(b)(1), unless the authorization is terminated  or revoked sooner.       Influenza A by PCR NEGATIVE NEGATIVE Final   Influenza B by PCR NEGATIVE NEGATIVE Final    Comment: (NOTE) The Xpert Xpress SARS-CoV-2/FLU/RSV plus assay is intended as an aid in the diagnosis of influenza from Nasopharyngeal swab specimens and should not be used as a sole basis for treatment. Nasal washings and aspirates are unacceptable for Xpert Xpress SARS-CoV-2/FLU/RSV testing.  Fact Sheet for  Patients: EntrepreneurPulse.com.au  Fact Sheet for Healthcare Providers: IncredibleEmployment.be  This test is not yet approved or cleared by the [place] FDA and has been authorized for detection and/or diagnosis of SARS-CoV-2 by FDA under an Emergency Use Authorization (EUA). This EUA will remain in effect (meaning this test can be used) for the duration of the COVID-19 declaration under Section 564(b)(1) of the Act, 21 U.S.C. section 360bbb-3(b)(1), unless the authorization is terminated or revoked.  Performed at [facility]  Lab, [place], [place] [address]   MRSA PCR Screening     Status: None   Collection Time: 12/09/2020  1:24 AM   Specimen: Nasal Mucosa; Nasopharyngeal  Result Value Ref Range Status   MRSA by PCR NEGATIVE NEGATIVE Final    Comment:        The GeneXpert MRSA Assay (FDA approved for NASAL specimens only), is one component of a comprehensive MRSA colonization surveillance program. It is not intended to diagnose MRSA infection nor to guide or monitor treatment for MRSA infections. Performed at [facility], [place] [address] [place], [place] [address]     Lab Basic Metabolic Panel: Recent Labs  Lab 12/01/2020 1812 11/27/2020 2300 12/22/2020 0145 11/30/2020 0146 12/07/2020 1551  NA 140 140  --  139  --   K 3.5 3.1*  --  2.8* 6.1*  CL 102  --   --  105  --   CO2 31  --   --  26  --   GLUCOSE 77  --   --  100*  --   BUN 8  --   --  7*  --   CREATININE 2.53*  --   --  2.70*  --   CALCIUM 8.3*  --   --  7.4*  --   MG  --   --  1.5*  --   --   PHOS  --   --  3.6  --   --    Liver Function Tests: No results for input(s): AST, ALT, ALKPHOS, BILITOT, PROT, ALBUMIN in the last 168 hours. No results for input(s): LIPASE, AMYLASE in the last 168 hours. No results for input(s): AMMONIA in the last 168 hours. CBC: Recent Labs  Lab 12/02/2020 1812 12/02/2020 2300 12/23/2020 0146  WBC 5.5  --  7.9   NEUTROABS 2.8  --   --   HGB 8.4* 8.2* 8.8*  HCT 25.5* 24.0* 27.5*  MCV 98.5  --  100.7*  PLT 155  --  161   Cardiac Enzymes: No results for input(s): CKTOTAL, CKMB, CKMBINDEX, TROPONINI in the last 168 hours. Sepsis Labs: Recent Labs  Lab 12/02/2020 1812 12/24/2020 0146  WBC 5.5 7.9    Procedures/Operations  none   Tierria Watson N [place] 12/11/2020, 6:44 PM

## 2020-12-27 DEATH — deceased

## 2021-03-02 ENCOUNTER — Ambulatory Visit: Payer: Medicare Other | Admitting: Physician Assistant

## 2021-03-08 ENCOUNTER — Ambulatory Visit: Payer: Medicare Other | Admitting: Physician Assistant

## 2021-03-13 ENCOUNTER — Encounter (INDEPENDENT_AMBULATORY_CARE_PROVIDER_SITE_OTHER): Payer: Medicare Other

## 2021-03-13 ENCOUNTER — Ambulatory Visit (INDEPENDENT_AMBULATORY_CARE_PROVIDER_SITE_OTHER): Payer: Medicare Other | Admitting: Nurse Practitioner

## 2021-06-14 ENCOUNTER — Encounter (INDEPENDENT_AMBULATORY_CARE_PROVIDER_SITE_OTHER): Payer: Medicare Other

## 2021-06-14 ENCOUNTER — Ambulatory Visit (INDEPENDENT_AMBULATORY_CARE_PROVIDER_SITE_OTHER): Payer: Medicare Other | Admitting: Nurse Practitioner
